# Patient Record
Sex: Female | Born: 1965 | Race: White | Hispanic: No | Marital: Married | Smoking: Never smoker
Health system: Southern US, Community
[De-identification: ages and names within clinical notes are randomized; demographics above are authoritative.]

## PROBLEM LIST (undated history)

## (undated) DIAGNOSIS — I5030 Unspecified diastolic (congestive) heart failure: Secondary | ICD-10-CM

## (undated) DIAGNOSIS — E119 Type 2 diabetes mellitus without complications: Secondary | ICD-10-CM

## (undated) DIAGNOSIS — F04 Amnestic disorder due to known physiological condition: Secondary | ICD-10-CM

## (undated) DIAGNOSIS — R4189 Other symptoms and signs involving cognitive functions and awareness: Secondary | ICD-10-CM

## (undated) DIAGNOSIS — N183 Chronic kidney disease, stage 3 unspecified: Secondary | ICD-10-CM

## (undated) DIAGNOSIS — N189 Chronic kidney disease, unspecified: Secondary | ICD-10-CM

## (undated) DIAGNOSIS — K219 Gastro-esophageal reflux disease without esophagitis: Secondary | ICD-10-CM

## (undated) DIAGNOSIS — K2950 Unspecified chronic gastritis without bleeding: Secondary | ICD-10-CM

## (undated) DIAGNOSIS — D631 Anemia in chronic kidney disease: Secondary | ICD-10-CM

## (undated) HISTORY — PX: CHOLECYSTECTOMY: SHX55

## (undated) HISTORY — PX: DILATION AND CURETTAGE OF UTERUS: SHX78

---

## 2005-05-21 ENCOUNTER — Ambulatory Visit: Payer: Self-pay | Admitting: Family Medicine

## 2006-02-14 ENCOUNTER — Other Ambulatory Visit: Payer: Self-pay

## 2006-02-14 ENCOUNTER — Ambulatory Visit: Payer: Self-pay | Admitting: General Surgery

## 2006-06-06 ENCOUNTER — Emergency Department: Payer: Self-pay | Admitting: Emergency Medicine

## 2006-07-08 ENCOUNTER — Ambulatory Visit: Payer: Self-pay | Admitting: Family Medicine

## 2006-08-06 ENCOUNTER — Ambulatory Visit: Payer: Self-pay | Admitting: Family Medicine

## 2006-09-06 ENCOUNTER — Ambulatory Visit: Payer: Self-pay | Admitting: Family Medicine

## 2006-09-26 ENCOUNTER — Ambulatory Visit: Payer: Self-pay | Admitting: Unknown Physician Specialty

## 2006-09-26 ENCOUNTER — Other Ambulatory Visit: Payer: Self-pay

## 2006-10-13 ENCOUNTER — Ambulatory Visit: Payer: Self-pay | Admitting: Cardiovascular Disease

## 2006-11-01 ENCOUNTER — Ambulatory Visit: Payer: Self-pay | Admitting: Unknown Physician Specialty

## 2006-11-21 ENCOUNTER — Emergency Department: Payer: Self-pay | Admitting: Emergency Medicine

## 2006-11-21 ENCOUNTER — Other Ambulatory Visit: Payer: Self-pay

## 2006-11-25 ENCOUNTER — Ambulatory Visit: Payer: Self-pay | Admitting: Family Medicine

## 2006-12-20 ENCOUNTER — Ambulatory Visit: Payer: Self-pay | Admitting: Family Medicine

## 2007-06-20 ENCOUNTER — Emergency Department: Payer: Self-pay | Admitting: Emergency Medicine

## 2007-06-22 ENCOUNTER — Emergency Department: Payer: Self-pay | Admitting: Emergency Medicine

## 2008-05-03 ENCOUNTER — Emergency Department: Payer: Self-pay | Admitting: Emergency Medicine

## 2009-04-25 ENCOUNTER — Emergency Department: Payer: Self-pay | Admitting: Emergency Medicine

## 2009-10-02 ENCOUNTER — Emergency Department: Payer: Self-pay | Admitting: Emergency Medicine

## 2010-04-14 ENCOUNTER — Emergency Department: Payer: Self-pay | Admitting: Emergency Medicine

## 2013-06-07 DIAGNOSIS — B9681 Helicobacter pylori [H. pylori] as the cause of diseases classified elsewhere: Secondary | ICD-10-CM

## 2013-06-07 HISTORY — DX: Helicobacter pylori (H. pylori) as the cause of diseases classified elsewhere: B96.81

## 2013-07-18 ENCOUNTER — Ambulatory Visit: Payer: Self-pay | Admitting: Family Medicine

## 2013-07-25 ENCOUNTER — Ambulatory Visit: Payer: Self-pay | Admitting: Family Medicine

## 2013-09-20 ENCOUNTER — Ambulatory Visit: Payer: Self-pay | Admitting: Gastroenterology

## 2013-09-24 ENCOUNTER — Ambulatory Visit: Payer: Self-pay | Admitting: Gastroenterology

## 2013-09-28 LAB — PATHOLOGY REPORT

## 2013-11-16 ENCOUNTER — Ambulatory Visit: Payer: Self-pay | Admitting: Gastroenterology

## 2013-11-19 ENCOUNTER — Ambulatory Visit: Payer: Self-pay | Admitting: Gastroenterology

## 2013-11-20 LAB — PATHOLOGY REPORT

## 2014-05-01 ENCOUNTER — Ambulatory Visit: Payer: Self-pay | Admitting: Family Medicine

## 2014-09-20 ENCOUNTER — Other Ambulatory Visit: Payer: Self-pay | Admitting: Family Medicine

## 2014-09-20 DIAGNOSIS — N6489 Other specified disorders of breast: Secondary | ICD-10-CM

## 2014-10-25 ENCOUNTER — Ambulatory Visit: Payer: Self-pay

## 2014-10-25 ENCOUNTER — Ambulatory Visit
Admission: RE | Admit: 2014-10-25 | Discharge: 2014-10-25 | Disposition: A | Discharge status: Home / Self Care | Payer: No Typology Code available for payment source | Source: Ambulatory Visit | Attending: Family Medicine | Admitting: Family Medicine

## 2014-10-25 DIAGNOSIS — R928 Other abnormal and inconclusive findings on diagnostic imaging of breast: Secondary | ICD-10-CM | POA: Diagnosis present

## 2014-10-25 DIAGNOSIS — N6489 Other specified disorders of breast: Secondary | ICD-10-CM

## 2015-04-29 ENCOUNTER — Ambulatory Visit
Admission: RE | Admit: 2015-04-29 | Discharge: 2015-04-29 | Disposition: A | Discharge status: Home / Self Care | Payer: No Typology Code available for payment source | Source: Ambulatory Visit | Attending: Family Medicine | Admitting: Family Medicine

## 2015-04-29 ENCOUNTER — Other Ambulatory Visit: Payer: Self-pay | Admitting: Family Medicine

## 2015-04-29 DIAGNOSIS — J069 Acute upper respiratory infection, unspecified: Secondary | ICD-10-CM

## 2015-04-29 DIAGNOSIS — R509 Fever, unspecified: Secondary | ICD-10-CM | POA: Diagnosis present

## 2015-04-29 DIAGNOSIS — R05 Cough: Secondary | ICD-10-CM | POA: Insufficient documentation

## 2015-09-17 ENCOUNTER — Other Ambulatory Visit: Payer: Self-pay | Admitting: Family Medicine

## 2015-09-17 DIAGNOSIS — Z1231 Encounter for screening mammogram for malignant neoplasm of breast: Secondary | ICD-10-CM

## 2015-10-27 ENCOUNTER — Ambulatory Visit: Payer: No Typology Code available for payment source

## 2015-10-29 ENCOUNTER — Other Ambulatory Visit: Payer: Self-pay | Admitting: Family Medicine

## 2015-10-29 ENCOUNTER — Ambulatory Visit
Admission: RE | Admit: 2015-10-29 | Discharge: 2015-10-29 | Disposition: A | Discharge status: Home / Self Care | Payer: BLUE CROSS/BLUE SHIELD | Source: Ambulatory Visit | Attending: Family Medicine | Admitting: Family Medicine

## 2015-10-29 DIAGNOSIS — Z1231 Encounter for screening mammogram for malignant neoplasm of breast: Secondary | ICD-10-CM

## 2019-03-16 ENCOUNTER — Emergency Department
Admission: EM | Admit: 2019-03-16 | Discharge: 2019-03-16 | Disposition: A | Discharge status: Home / Self Care | Payer: No Typology Code available for payment source | Attending: Emergency Medicine | Admitting: Emergency Medicine

## 2019-03-16 ENCOUNTER — Encounter: Payer: Self-pay | Admitting: Emergency Medicine

## 2019-03-16 ENCOUNTER — Emergency Department: Payer: No Typology Code available for payment source

## 2019-03-16 ENCOUNTER — Other Ambulatory Visit: Payer: Self-pay

## 2019-03-16 DIAGNOSIS — Y93I9 Activity, other involving external motion: Secondary | ICD-10-CM | POA: Diagnosis not present

## 2019-03-16 DIAGNOSIS — M1611 Unilateral primary osteoarthritis, right hip: Secondary | ICD-10-CM

## 2019-03-16 DIAGNOSIS — M5416 Radiculopathy, lumbar region: Secondary | ICD-10-CM | POA: Diagnosis not present

## 2019-03-16 DIAGNOSIS — M545 Low back pain: Secondary | ICD-10-CM | POA: Diagnosis present

## 2019-03-16 DIAGNOSIS — Y92414 Local residential or business street as the place of occurrence of the external cause: Secondary | ICD-10-CM | POA: Insufficient documentation

## 2019-03-16 DIAGNOSIS — Y999 Unspecified external cause status: Secondary | ICD-10-CM | POA: Diagnosis not present

## 2019-03-16 DIAGNOSIS — M25559 Pain in unspecified hip: Secondary | ICD-10-CM

## 2019-03-16 MED ORDER — TRAMADOL HCL 50 MG PO TABS: 50.0000 mg | ORAL_TABLET | Freq: Four times a day (QID) | ORAL | 0 refills | Status: DC | PRN

## 2019-03-16 MED ORDER — TRAMADOL HCL 50 MG PO TABS
50.0000 mg | ORAL_TABLET | Freq: Once | ORAL | Status: AC
  Administered 2019-03-16: 50 mg via ORAL
  Filled 2019-03-16: qty 1
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
## 2019-03-16 NOTE — Discharge Instructions (Addendum)
Please take tramadol as needed for severe pain.  You may use Tylenol for mild to moderate pain.  Apply heating pad to the lower back and hip.  If no improvement in 2 weeks follow-up with orthopedics.

## 2019-03-16 NOTE — ED Notes (Signed)
Patient back from x-ray 

## 2019-07-17 ENCOUNTER — Encounter: Payer: Self-pay | Admitting: Emergency Medicine

## 2019-07-17 ENCOUNTER — Inpatient Hospital Stay
Admission: EM | Admit: 2019-07-17 | Discharge: 2019-07-30 | DRG: 388 | Disposition: A | Discharge status: Home Health | Payer: Self-pay | Attending: Hospitalist | Admitting: Hospitalist

## 2019-07-17 ENCOUNTER — Emergency Department: Payer: Self-pay

## 2019-07-17 ENCOUNTER — Other Ambulatory Visit: Payer: Self-pay

## 2019-07-17 DIAGNOSIS — E872 Acidosis, unspecified: Secondary | ICD-10-CM

## 2019-07-17 DIAGNOSIS — Z880 Allergy status to penicillin: Secondary | ICD-10-CM

## 2019-07-17 DIAGNOSIS — E876 Hypokalemia: Secondary | ICD-10-CM | POA: Diagnosis present

## 2019-07-17 DIAGNOSIS — H02402 Unspecified ptosis of left eyelid: Secondary | ICD-10-CM | POA: Diagnosis present

## 2019-07-17 DIAGNOSIS — N170 Acute kidney failure with tubular necrosis: Secondary | ICD-10-CM | POA: Diagnosis present

## 2019-07-17 DIAGNOSIS — R638 Other symptoms and signs concerning food and fluid intake: Secondary | ICD-10-CM

## 2019-07-17 DIAGNOSIS — E860 Dehydration: Secondary | ICD-10-CM | POA: Diagnosis present

## 2019-07-17 DIAGNOSIS — Z79891 Long term (current) use of opiate analgesic: Secondary | ICD-10-CM

## 2019-07-17 DIAGNOSIS — Z20822 Contact with and (suspected) exposure to covid-19: Secondary | ICD-10-CM | POA: Diagnosis present

## 2019-07-17 DIAGNOSIS — K449 Diaphragmatic hernia without obstruction or gangrene: Secondary | ICD-10-CM | POA: Diagnosis present

## 2019-07-17 DIAGNOSIS — K2900 Acute gastritis without bleeding: Secondary | ICD-10-CM | POA: Diagnosis present

## 2019-07-17 DIAGNOSIS — R112 Nausea with vomiting, unspecified: Secondary | ICD-10-CM

## 2019-07-17 DIAGNOSIS — Z8719 Personal history of other diseases of the digestive system: Secondary | ICD-10-CM

## 2019-07-17 DIAGNOSIS — E785 Hyperlipidemia, unspecified: Secondary | ICD-10-CM | POA: Diagnosis present

## 2019-07-17 DIAGNOSIS — Z8619 Personal history of other infectious and parasitic diseases: Secondary | ICD-10-CM

## 2019-07-17 DIAGNOSIS — K8500 Idiopathic acute pancreatitis without necrosis or infection: Secondary | ICD-10-CM

## 2019-07-17 DIAGNOSIS — E119 Type 2 diabetes mellitus without complications: Secondary | ICD-10-CM

## 2019-07-17 DIAGNOSIS — K2950 Unspecified chronic gastritis without bleeding: Secondary | ICD-10-CM | POA: Diagnosis present

## 2019-07-17 DIAGNOSIS — Z7982 Long term (current) use of aspirin: Secondary | ICD-10-CM

## 2019-07-17 DIAGNOSIS — E1169 Type 2 diabetes mellitus with other specified complication: Secondary | ICD-10-CM | POA: Diagnosis present

## 2019-07-17 DIAGNOSIS — K859 Acute pancreatitis without necrosis or infection, unspecified: Secondary | ICD-10-CM | POA: Diagnosis present

## 2019-07-17 DIAGNOSIS — Z6823 Body mass index (BMI) 23.0-23.9, adult: Secondary | ICD-10-CM

## 2019-07-17 DIAGNOSIS — I959 Hypotension, unspecified: Secondary | ICD-10-CM | POA: Diagnosis present

## 2019-07-17 DIAGNOSIS — Z9049 Acquired absence of other specified parts of digestive tract: Secondary | ICD-10-CM

## 2019-07-17 DIAGNOSIS — G510 Bell's palsy: Secondary | ICD-10-CM | POA: Diagnosis present

## 2019-07-17 DIAGNOSIS — R531 Weakness: Secondary | ICD-10-CM

## 2019-07-17 DIAGNOSIS — Z79899 Other long term (current) drug therapy: Secondary | ICD-10-CM

## 2019-07-17 DIAGNOSIS — E46 Unspecified protein-calorie malnutrition: Secondary | ICD-10-CM

## 2019-07-17 DIAGNOSIS — E43 Unspecified severe protein-calorie malnutrition: Secondary | ICD-10-CM | POA: Diagnosis present

## 2019-07-17 DIAGNOSIS — K3184 Gastroparesis: Secondary | ICD-10-CM | POA: Diagnosis present

## 2019-07-17 DIAGNOSIS — R1110 Vomiting, unspecified: Secondary | ICD-10-CM | POA: Diagnosis present

## 2019-07-17 DIAGNOSIS — R627 Adult failure to thrive: Secondary | ICD-10-CM | POA: Diagnosis present

## 2019-07-17 DIAGNOSIS — K5641 Fecal impaction: Principal | ICD-10-CM | POA: Diagnosis present

## 2019-07-17 DIAGNOSIS — Z597 Insufficient social insurance and welfare support: Secondary | ICD-10-CM

## 2019-07-17 DIAGNOSIS — F329 Major depressive disorder, single episode, unspecified: Secondary | ICD-10-CM | POA: Diagnosis present

## 2019-07-17 DIAGNOSIS — E1143 Type 2 diabetes mellitus with diabetic autonomic (poly)neuropathy: Secondary | ICD-10-CM | POA: Diagnosis present

## 2019-07-17 DIAGNOSIS — E11649 Type 2 diabetes mellitus with hypoglycemia without coma: Secondary | ICD-10-CM | POA: Diagnosis present

## 2019-07-17 DIAGNOSIS — R110 Nausea: Secondary | ICD-10-CM

## 2019-07-17 DIAGNOSIS — R634 Abnormal weight loss: Secondary | ICD-10-CM

## 2019-07-17 DIAGNOSIS — K5900 Constipation, unspecified: Secondary | ICD-10-CM

## 2019-07-17 DIAGNOSIS — F32A Depression, unspecified: Secondary | ICD-10-CM

## 2019-07-17 DIAGNOSIS — F05 Delirium due to known physiological condition: Secondary | ICD-10-CM | POA: Diagnosis not present

## 2019-07-17 DIAGNOSIS — N179 Acute kidney failure, unspecified: Secondary | ICD-10-CM

## 2019-07-17 DIAGNOSIS — Z803 Family history of malignant neoplasm of breast: Secondary | ICD-10-CM

## 2019-07-17 HISTORY — DX: Type 2 diabetes mellitus without complications: E11.9

## 2019-07-17 LAB — LIPASE, BLOOD: Lipase: 83 U/L — ABNORMAL HIGH (ref 11–51)

## 2019-07-17 LAB — URINALYSIS, COMPLETE (UACMP) WITH MICROSCOPIC
Bacteria, UA: NONE SEEN
Bilirubin Urine: NEGATIVE
Glucose, UA: NEGATIVE mg/dL
Hgb urine dipstick: NEGATIVE
Ketones, ur: 20 mg/dL — AB
Nitrite: NEGATIVE
Protein, ur: NEGATIVE mg/dL
Specific Gravity, Urine: 1.03 (ref 1.005–1.030)
pH: 6 (ref 5.0–8.0)

## 2019-07-17 LAB — COMPREHENSIVE METABOLIC PANEL
ALT: 22 U/L (ref 0–44)
AST: 25 U/L (ref 15–41)
Albumin: 4.2 g/dL (ref 3.5–5.0)
Alkaline Phosphatase: 109 U/L (ref 38–126)
Anion gap: 23 — ABNORMAL HIGH (ref 5–15)
BUN: 32 mg/dL — ABNORMAL HIGH (ref 6–20)
CO2: 19 mmol/L — ABNORMAL LOW (ref 22–32)
Calcium: 9.7 mg/dL (ref 8.9–10.3)
Chloride: 94 mmol/L — ABNORMAL LOW (ref 98–111)
Creatinine, Ser: 1.18 mg/dL — ABNORMAL HIGH (ref 0.44–1.00)
GFR calc Af Amer: >60 mL/min (ref >60)
GFR calc non Af Amer: 53 mL/min — ABNORMAL LOW (ref >60)
Glucose, Bld: 176 mg/dL — ABNORMAL HIGH (ref 70–99)
Potassium: 3.2 mmol/L — ABNORMAL LOW (ref 3.5–5.1)
Sodium: 136 mmol/L (ref 135–145)
Total Bilirubin: 1.9 mg/dL — ABNORMAL HIGH (ref 0.3–1.2)
Total Protein: 8.2 g/dL — ABNORMAL HIGH (ref 6.5–8.1)

## 2019-07-17 LAB — CBC
HCT: 44.5 % (ref 36.0–46.0)
HCT: 39.5 % (ref 36.0–46.0)
Hemoglobin: 15 g/dL (ref 12.0–15.0)
Hemoglobin: 13.3 g/dL (ref 12.0–15.0)
MCH: 27 pg (ref 26.0–34.0)
MCH: 27.5 pg (ref 26.0–34.0)
MCHC: 33.7 g/dL (ref 30.0–36.0)
MCHC: 33.7 g/dL (ref 30.0–36.0)
MCV: 80.2 fL (ref 80.0–100.0)
MCV: 81.6 fL (ref 80.0–100.0)
Platelets: 270 10*3/uL (ref 150–400)
Platelets: 210 10*3/uL (ref 150–400)
RBC: 5.55 MIL/uL — ABNORMAL HIGH (ref 3.87–5.11)
RBC: 4.84 MIL/uL (ref 3.87–5.11)
RDW: 18.5 % — ABNORMAL HIGH (ref 11.5–15.5)
RDW: 18.3 % — ABNORMAL HIGH (ref 11.5–15.5)
WBC: 8.1 10*3/uL (ref 4.0–10.5)
WBC: 8.6 10*3/uL (ref 4.0–10.5)
nRBC: 0.2 % (ref 0.0–0.2)
nRBC: 0 % (ref 0.0–0.2)

## 2019-07-17 LAB — RESPIRATORY PANEL BY RT PCR (FLU A&B, COVID)
Influenza A by PCR: NEGATIVE
Influenza B by PCR: NEGATIVE
SARS Coronavirus 2 by RT PCR: NEGATIVE

## 2019-07-17 LAB — GLUCOSE, CAPILLARY: Glucose-Capillary: 104 mg/dL — ABNORMAL HIGH (ref 70–99)

## 2019-07-17 LAB — POC SARS CORONAVIRUS 2 AG: SARS Coronavirus 2 Ag: NEGATIVE

## 2019-07-17 MED ORDER — ONDANSETRON HCL 4 MG/2ML IJ SOLN
4.0000 mg | Freq: Once | INTRAMUSCULAR | Status: AC | PRN
  Administered 2019-07-17: 4 mg via INTRAVENOUS
  Filled 2019-07-17: qty 2

## 2019-07-17 MED ORDER — PANTOPRAZOLE SODIUM 40 MG IV SOLR
40.0000 mg | INTRAVENOUS | Status: DC
  Administered 2019-07-17 – 2019-07-18 (×2): 40 mg via INTRAVENOUS
  Filled 2019-07-17 (×2): qty 40

## 2019-07-17 MED ORDER — SODIUM CHLORIDE 0.9 % IV BOLUS
1000.0000 mL | Freq: Once | INTRAVENOUS | Status: AC
  Administered 2019-07-17: 1000 mL via INTRAVENOUS

## 2019-07-17 MED ORDER — ONDANSETRON HCL 4 MG PO TABS: 4.0000 mg | ORAL_TABLET | Freq: Four times a day (QID) | ORAL | Status: DC | PRN

## 2019-07-17 MED ORDER — SODIUM CHLORIDE 0.9 % IV SOLN: Freq: Once | INTRAVENOUS | Status: AC

## 2019-07-17 MED ORDER — INSULIN ASPART 100 UNIT/ML ~~LOC~~ SOLN: 0.0000 [IU] | Freq: Three times a day (TID) | SUBCUTANEOUS | Status: DC

## 2019-07-17 MED ORDER — ACETAMINOPHEN 650 MG RE SUPP: 650.0000 mg | Freq: Four times a day (QID) | RECTAL | Status: DC | PRN

## 2019-07-17 MED ORDER — ACETAMINOPHEN 325 MG PO TABS
650.0000 mg | ORAL_TABLET | Freq: Four times a day (QID) | ORAL | Status: DC | PRN
  Administered 2019-07-18 – 2019-07-21 (×3): 650 mg via ORAL
  Filled 2019-07-17 (×4): qty 2

## 2019-07-17 MED ORDER — IOHEXOL 300 MG/ML  SOLN
75.0000 mL | Freq: Once | INTRAMUSCULAR | Status: AC | PRN
  Administered 2019-07-17: 75 mL via INTRAVENOUS

## 2019-07-17 MED ORDER — PROMETHAZINE HCL 25 MG/ML IJ SOLN
12.5000 mg | Freq: Four times a day (QID) | INTRAMUSCULAR | Status: DC | PRN
  Administered 2019-07-17: 12.5 mg via INTRAVENOUS
  Filled 2019-07-17: qty 1

## 2019-07-17 MED ORDER — ONDANSETRON HCL 4 MG/2ML IJ SOLN
4.0000 mg | Freq: Four times a day (QID) | INTRAMUSCULAR | Status: DC | PRN
  Administered 2019-07-18 – 2019-07-26 (×5): 4 mg via INTRAVENOUS
  Filled 2019-07-17 (×5): qty 2

## 2019-07-17 MED ORDER — FAMOTIDINE IN NACL 20-0.9 MG/50ML-% IV SOLN
20.0000 mg | Freq: Once | INTRAVENOUS | Status: AC
  Administered 2019-07-17: 20 mg via INTRAVENOUS
  Filled 2019-07-17: qty 50

## 2019-07-17 MED ORDER — PROMETHAZINE HCL 25 MG RE SUPP: 25.0000 mg | Freq: Four times a day (QID) | RECTAL | Status: DC | PRN

## 2019-07-17 MED ORDER — ENOXAPARIN SODIUM 40 MG/0.4ML ~~LOC~~ SOLN
40.0000 mg | SUBCUTANEOUS | Status: DC
  Administered 2019-07-17 – 2019-07-23 (×7): 40 mg via SUBCUTANEOUS
  Filled 2019-07-17 (×7): qty 0.4

## 2019-07-17 MED ORDER — ONDANSETRON HCL 4 MG/2ML IJ SOLN
4.0000 mg | Freq: Once | INTRAMUSCULAR | Status: AC
  Administered 2019-07-17: 4 mg via INTRAVENOUS
  Filled 2019-07-17: qty 2

## 2019-07-17 MED ORDER — POTASSIUM CHLORIDE IN NACL 40-0.9 MEQ/L-% IV SOLN
INTRAVENOUS | Status: DC
  Administered 2019-07-17 – 2019-07-18 (×2): 125 mL/h via INTRAVENOUS
  Filled 2019-07-17 (×6): qty 1000

## 2019-07-17 NOTE — H&P (Signed)
History and Physical    Deanna Joseph ZOX:[id number] DOB: [birth year] DOA: 07/17/2019  PCP: Hillery Aldo, MD   Patient coming from: home  I have personally briefly reviewed patient's old medical records in [facility]  Chief Complaint: intractable vomiting,weight loss  HPI: Deanna Joseph is a 54 y.o. female with medical history significant for diabetes and hypertension, no longer on medication due to blood sugar from recent unintentional weight loss, as well as history of chronic active gastritis and H. pylori on EGD in 2015, who presents to the emergency room with intractable vomiting, unable to hold anything down and a 30 pound weight loss in the past 3 months.  Patient had a similar presentation back in 2015 when she had her endoscopy.  She denies fever, chills, cough, has minimal epigastric pain.  Vomiting occurs after ingestion of any food stuff and consists of ingested food products, no blood no bile and no coffee grounds.  She denies change in bowel habit.  Denies chest pains or shortness of breath  ED Course: On arrival in the emergency room she was afebrile, tachycardic at 127 and hypotensive at 93/49 improving to 106/77 with fluid bolus.  RR was 16 with O2 sat 93% on room air.  On her blood work she had a creatinine of 1.18 to baseline unknown and electrolyte disturbance to include hypokalemia of 3.2 and low serum bicarb of 19.  Lipase was slightly elevated at 83.  White cell count was normal at 8100, hemoglobin 15.  The abdomen and pelvis showed no acute findings.  Lung bases were clear.  Was given an IV fluid bolus along with IV famotidine and hospitalist consulted for admission.  Review of Systems: As per HPI otherwise 10 point review of systems negative.    Past Medical History:  Diagnosis Date  . Diabetes mellitus without complication [facility])     Past Surgical History:  Procedure Laterality Date  . CESAREAN SECTION    . CHOLECYSTECTOMY    . DILATION AND CURETTAGE OF  UTERUS       has no history on file for tobacco, alcohol, and drug.  Allergies  Allergen Reactions  . Penicillins     Rash     Family History  Problem Relation Age of Onset  . Breast cancer Maternal Aunt 55     Prior to Admission medications   Medication Sig Start Date End Date Taking? Authorizing Provider  aspirin EC 81 MG tablet Take 81 mg by mouth daily.   Yes [provider]  atorvastatin (LIPITOR) 20 MG tablet Take 20 mg by mouth daily.   Yes [provider]  cetirizine (ZYRTEC) 10 MG tablet Take 10 mg by mouth daily.   Yes [provider]  gabapentin (NEURONTIN) 300 MG capsule Take 300 mg by mouth at bedtime.   Yes [provider]  omeprazole (PRILOSEC) 20 MG capsule Take 20 mg by mouth daily.   Yes [provider]  PARoxetine (PAXIL) 40 MG tablet Take 40 mg by mouth daily.   Yes [provider]  traMADol (ULTRAM) 50 MG tablet Take 1 tablet (50 mg total) by mouth every 6 (six) hours as needed. 03/16/19 03/15/20 Yes Evon Slack, PA-C    Physical Exam: Vitals:   07/17/19 1755 07/17/19 1756 07/17/19 2000 07/17/19 2030  BP:  (!) 93/49 106/77 111/64  Pulse:  (!) 127    Resp:  16    SpO2:  93%    Weight: 54.8 kg  Height: 5\' 1"  (1.549 m)        Vitals:   07/17/19 1755 07/17/19 1756 07/17/19 2000 07/17/19 2030  BP:  (!) 93/49 106/77 111/64  Pulse:  (!) 127    Resp:  16    SpO2:  93%    Weight: 54.8 kg     Height: 5\' 1"  (1.549 m)       Constitutional: NAD, alert and oriented x 3 Eyes: PERRL, lids and conjunctivae normal ENMT: Mucous membranes are moist.  Neck: normal, supple, no masses, no thyromegaly Respiratory: clear to auscultation bilaterally, no wheezing, no crackles. Normal respiratory effort. No accessory muscle use.  Cardiovascular: Regular rate and rhythm, no murmurs / rubs / gallops. No extremity edema. 2+ pedal pulses. No carotid bruits.  Abdomen: non tender no masses palpated. No  hepatosplenomegaly. Bowel sounds positive.  Musculoskeletal: no clubbing / cyanosis. No joint deformity upper and lower extremities.  Skin: no rashes, lesions, ulcers.  Neurologic: No gross focal neurologic deficit. Psychiatric: Normal mood and affect.   Labs on Admission: I have personally reviewed following labs and imaging studies  CBC: Recent Labs  Lab 07/17/19 1809  WBC 8.1  HGB 15.0  HCT 44.5  MCV 80.2  PLT 270   Basic Metabolic Panel: Recent Labs  Lab 07/17/19 1809  NA 136  K 3.2*  CL 94*  CO2 19*  GLUCOSE 176*  BUN 32*  CREATININE 1.18*  CALCIUM 9.7   GFR: Estimated Creatinine Clearance: 41.6 mL/min (A) (by C-G formula based on SCr of 1.18 mg/dL (H)). Liver Function Tests: Recent Labs  Lab 07/17/19 1809  AST 25  ALT 22  ALKPHOS 109  BILITOT 1.9*  PROT 8.2*  ALBUMIN 4.2   Recent Labs  Lab 07/17/19 1809  LIPASE 83*   No results for input(s): AMMONIA in the last 168 hours. Coagulation Profile: No results for input(s): INR, PROTIME in the last 168 hours. Cardiac Enzymes: No results for input(s): CKTOTAL, CKMB, CKMBINDEX, TROPONINI in the last 168 hours. BNP (last 3 results) No results for input(s): PROBNP in the last 8760 hours. HbA1C: No results for input(s): HGBA1C in the last 72 hours. CBG: No results for input(s): GLUCAP in the last 168 hours. Lipid Profile: No results for input(s): CHOL, HDL, LDLCALC, TRIG, CHOLHDL, LDLDIRECT in the last 72 hours. Thyroid Function Tests: No results for input(s): TSH, T4TOTAL, FREET4, T3FREE, THYROIDAB in the last 72 hours. Anemia Panel: No results for input(s): VITAMINB12, FOLATE, FERRITIN, TIBC, IRON, RETICCTPCT in the last 72 hours. Urine analysis: No results found for: COLORURINE, APPEARANCEUR, LABSPEC, PHURINE, GLUCOSEU, HGBUR, BILIRUBINUR, KETONESUR, PROTEINUR, UROBILINOGEN, NITRITE, LEUKOCYTESUR  Radiological Exams on Admission: CT ABDOMEN PELVIS W CONTRAST  Result Date: 07/17/2019 CLINICAL DATA:   54 year old female with nausea vomiting. EXAM: CT ABDOMEN AND PELVIS WITH CONTRAST TECHNIQUE: Multidetector CT imaging of the abdomen and pelvis was performed using the standard protocol following bolus administration of intravenous contrast. CONTRAST:  7mL OMNIPAQUE IOHEXOL 300 MG/ML  SOLN COMPARISON:  None. FINDINGS: Lower chest: There is a 4 mm right lower lobe nodule (series 4, image 3), possibly a partially calcified granuloma. The visualized lung bases are otherwise clear. No intra-abdominal free air or free fluid. Hepatobiliary: The liver is unremarkable. No intrahepatic biliary duct dilatation. Cholecystectomy. Pancreas: Unremarkable. No pancreatic ductal dilatation or surrounding inflammatory changes. Spleen: Normal in size without focal abnormality. Adrenals/Urinary Tract: The adrenal glands are unremarkable. The kidneys, visualized ureters, and urinary bladder appear unremarkable. Stomach/Bowel: There is moderate stool throughout the colon. There  is no bowel obstruction or active inflammation. Normal appendix. Vascular/Lymphatic: Minimal aortoiliac atherosclerotic disease. The IVC is unremarkable. No portal venous gas. There is no adenopathy. Reproductive: Hysterectomy.  No adnexal masses. Other: None Musculoskeletal: No acute or significant osseous findings. IMPRESSION: 1. No acute intra-abdominopelvic pathology. 2. Mild Aortic Atherosclerosis (ICD10-I70.0). Electronically Signed   By: Anner Crete M.D.   On: 07/17/2019 19:54    EKG: Independently reviewed.   Assessment/Plan Principal Problem: Suspect acute gastritis possible PUD History of chronic gastritis History of H. pylori infection -Lipase elevated but possibly reactive -Patient presented with intractable vomiting with nausea and has history of positive H. pylori on EGD in 2015 -IV Protonix -Gastroenterology consult for consideration of EGD -Keep n.p.o.    AKI (acute kidney injury) ([place])   Metabolic acidosis    Hypokalemia -Prerenal ATN secondary to acute on chronic vomiting -IV hydration with potassium -Monitor renal function and avoid nephrotoxins    Elevated lipase -Likely reactive but continue to monitor and keep n.p.o. -CT abdomen and pelvis showed no acute findings    Unintentional weight loss of more than 10 pounds in 90 days  -Nutritionist consult    Diet-controlled diabetes mellitus ([place]) -Patient no longer on medication, related to ongoing weight loss    DVT prophylaxis: lovenox  Code Status: full code  Family Communication: none  Disposition Plan: Back to previous home environment Consults called: GI, Dr. Christella Hartigan MD [facility]     07/17/2019, 9:00 PM

## 2019-07-17 NOTE — ED Notes (Signed)
poct covid Negative. 

## 2019-07-17 NOTE — ED Triage Notes (Signed)
Patient reports intermittent vomiting since January. States she was taken off of all diabetic meds then, however her glucose has remained stable. Patient states her PCP was supposed to schedule CT scan, however she has not had it yet.

## 2019-07-17 NOTE — ED Notes (Signed)
Pt ambulated to bathroom with one assist. Pt started vomiting after getting back to bed. MD notified.

## 2019-07-17 NOTE — ED Notes (Addendum)
Pt reports nausea for several weeks.  States it is worse for last 3 days.  Hx diabetes, no meds now for diabetes..  Pt saw her doctor who wanted her to have a ct scan.  Pt alert  Iv in place.

## 2019-07-17 NOTE — ED Provider Notes (Signed)
[facility] Emergency Department Provider Note       Time seen: ----------------------------------------- 7:20 PM on 07/17/2019 -----------------------------------------   I have reviewed the triage vital signs and the nursing notes.  HISTORY   Chief Complaint Emesis    HPI Deanna Joseph is a 54 y.o. female with no significant past medical history who presents to the ED for intermittent vomiting since January.  Patient states vomiting is worse if she tries to eat or if she moves around.  She states she is losing about a pound a day.  Patient states she was taken off all of her diabetic medications then, however her glucose remained stable.  Primary care doctor was supposed to schedule CT scan, however she has not had that yet.  She denies fevers, chills or other complaints.  History reviewed. No pertinent past medical history.  There are no problems to display for this patient.   Past Surgical History:  Procedure Laterality Date  . CESAREAN SECTION    . CHOLECYSTECTOMY    . DILATION AND CURETTAGE OF UTERUS      Allergies Penicillins  Social History Social History   Tobacco Use  . Smoking status: Not on file  Substance Use Topics  . Alcohol use: Not on file  . Drug use: Not on file    Review of Systems Constitutional: Negative for fever. Cardiovascular: Negative for chest pain. Respiratory: Negative for shortness of breath. Gastrointestinal: Negative for abdominal pain, positive for vomiting Musculoskeletal: Negative for back pain. Skin: Negative for rash. Neurological: Negative for headaches, focal weakness or numbness.  All systems negative/normal/unremarkable except as stated in the HPI  ____________________________________________   PHYSICAL EXAM:  VITAL SIGNS: ED Triage Vitals  Enc Vitals Group     BP 07/17/19 1756 (!) 93/49     Pulse Rate 07/17/19 1756 (!) 127     Resp 07/17/19 1756 16     Temp --      Temp src --      SpO2 07/17/19 1756 93 %     Weight 07/17/19 1755 120 lb 12.8 oz (54.8 kg)     Height 07/17/19 1755 5\' 1"  (1.549 m)     Head Circumference --      Peak Flow --      Pain Score 07/17/19 1755 5     Pain Loc --      Pain Edu? --      Excl. in [place]? --     Constitutional: Alert and oriented.  Mild to moderate distress Eyes: Conjunctivae are normal. Normal extraocular movements. ENT      Head: Normocephalic and atraumatic.      Nose: No congestion/rhinnorhea.      Mouth/Throat: Mucous membranes are moist.      Neck: No stridor. Cardiovascular: Normal rate, regular rhythm. No murmurs, rubs, or gallops. Respiratory: Normal respiratory effort without tachypnea nor retractions. Breath sounds are clear and equal bilaterally. No wheezes/rales/rhonchi. Gastrointestinal: Nonfocal tenderness, normal bowel sounds Musculoskeletal: Nontender with normal range of motion in extremities. No lower extremity tenderness nor edema. Neurologic:  Normal speech and language. No gross focal neurologic deficits are appreciated.  Skin:  Skin is warm, dry and intact. No rash noted. Psychiatric: Mood and affect are normal. Speech and behavior are normal.  ____________________________________________  EKG: Interpreted by me.  Sinus tachycardia with a rate of 128 bpm, nonspecific ST and T wave abnormality, normal axis, normal QT  ____________________________________________  ED COURSE:  As part of my medical decision  making, I reviewed the following data within the electronic MEDICAL RECORD NUMBER History obtained from family if available, nursing notes, old chart and ekg, as well as notes from prior ED visits. Patient presented for intermittent vomiting, we will assess with labs and imaging as indicated at this time.   Procedures  Deanna Joseph was evaluated in Emergency Department on 07/17/2019 for the symptoms described in the history of present illness. She was evaluated in the context of the global COVID-19  pandemic, which necessitated consideration that the patient might be at risk for infection with the SARS-CoV-2 virus that causes COVID-19. Institutional protocols and algorithms that pertain to the evaluation of patients at risk for COVID-19 are in a state of rapid change based on information released by regulatory bodies including the CDC and federal and state organizations. These policies and algorithms were followed during the patient's care in the ED.  ____________________________________________   LABS (pertinent positives/negatives)  Labs Reviewed  LIPASE, BLOOD - Abnormal; Notable for the following components:      Result Value   Lipase 83 (*)    All other components within normal limits  COMPREHENSIVE METABOLIC PANEL - Abnormal; Notable for the following components:   Potassium 3.2 (*)    Chloride 94 (*)    CO2 19 (*)    Glucose, Bld 176 (*)    BUN 32 (*)    Creatinine, Ser 1.18 (*)    Total Protein 8.2 (*)    Total Bilirubin 1.9 (*)    GFR calc non Af Amer 53 (*)    Anion gap 23 (*)    All other components within normal limits  CBC - Abnormal; Notable for the following components:   RBC 5.55 (*)    RDW 18.5 (*)    All other components within normal limits  URINALYSIS, COMPLETE (UACMP) WITH MICROSCOPIC    RADIOLOGY Images were viewed by me  CT the abdomen pelvis with contrast IMPRESSION: 1. No acute intra-abdominopelvic pathology. 2. Mild Aortic Atherosclerosis (ICD10-I70.0). ____________________________________________   DIFFERENTIAL DIAGNOSIS   Gastroenteritis, dehydration, electrolyte abnormality, pancreatitis, choledocholithiasis, peptic ulcer disease  FINAL ASSESSMENT AND PLAN  Vomiting, mild pancreatitis, 30 pound weight loss   Plan: The patient had presented for persistent vomiting. Patient's labs reveal dehydration as well as mildly elevated lipase, mild hyperbilirubinemia.  CBC was unremarkable. Patient's imaging did not reveal any acute process.  She  has received fluids and Zofran as well as IV Pepcid.  She has essentially had intractable vomiting and is lost 30 pounds in the last month or so.  I think she would benefit from hospital admission, I will discuss with the hospitalist for admission with GI consultation.   Ulice Dash, MD    Note: This note was generated in part or whole with voice recognition software. Voice recognition is usually quite accurate but there are transcription errors that can and very often do occur. I apologize for any typographical errors that were not detected and corrected.     Emily Filbert, MD 07/17/19 2022

## 2019-07-18 ENCOUNTER — Encounter
Admission: EM | Disposition: A | Discharge status: Home Health | Payer: Self-pay | Source: Home / Self Care | Attending: Hospitalist

## 2019-07-18 ENCOUNTER — Inpatient Hospital Stay: Payer: Self-pay | Admitting: Anesthesiology

## 2019-07-18 ENCOUNTER — Encounter: Payer: Self-pay | Admitting: Internal Medicine

## 2019-07-18 DIAGNOSIS — K3184 Gastroparesis: Secondary | ICD-10-CM | POA: Diagnosis present

## 2019-07-18 HISTORY — PX: ESOPHAGOGASTRODUODENOSCOPY: SHX5428

## 2019-07-18 LAB — BASIC METABOLIC PANEL
Anion gap: 14 (ref 5–15)
BUN: 27 mg/dL — ABNORMAL HIGH (ref 6–20)
CO2: 20 mmol/L — ABNORMAL LOW (ref 22–32)
Calcium: 8.3 mg/dL — ABNORMAL LOW (ref 8.9–10.3)
Chloride: 108 mmol/L (ref 98–111)
Creatinine, Ser: 0.85 mg/dL (ref 0.44–1.00)
GFR calc Af Amer: >60 mL/min (ref >60)
GFR calc non Af Amer: >60 mL/min (ref >60)
Glucose, Bld: 86 mg/dL (ref 70–99)
Potassium: 3.9 mmol/L (ref 3.5–5.1)
Sodium: 142 mmol/L (ref 135–145)

## 2019-07-18 LAB — GLUCOSE, CAPILLARY
Glucose-Capillary: 66 mg/dL — ABNORMAL LOW (ref 70–99)
Glucose-Capillary: 133 mg/dL — ABNORMAL HIGH (ref 70–99)
Glucose-Capillary: 85 mg/dL (ref 70–99)
Glucose-Capillary: 81 mg/dL (ref 70–99)
Glucose-Capillary: 80 mg/dL (ref 70–99)

## 2019-07-18 LAB — CBC
HCT: 35.2 % — ABNORMAL LOW (ref 36.0–46.0)
Hemoglobin: 12 g/dL (ref 12.0–15.0)
MCH: 27.3 pg (ref 26.0–34.0)
MCHC: 34.1 g/dL (ref 30.0–36.0)
MCV: 80.2 fL (ref 80.0–100.0)
Platelets: 156 10*3/uL (ref 150–400)
RBC: 4.39 MIL/uL (ref 3.87–5.11)
RDW: 18 % — ABNORMAL HIGH (ref 11.5–15.5)
WBC: 6 10*3/uL (ref 4.0–10.5)
nRBC: 0 % (ref 0.0–0.2)

## 2019-07-18 LAB — HIV ANTIBODY (ROUTINE TESTING W REFLEX): HIV Screen 4th Generation wRfx: NONREACTIVE

## 2019-07-18 SURGERY — EGD (ESOPHAGOGASTRODUODENOSCOPY)
Anesthesia: General

## 2019-07-18 MED ORDER — ONDANSETRON HCL 4 MG/2ML IJ SOLN
INTRAMUSCULAR | Status: AC
  Filled 2019-07-18: qty 2

## 2019-07-18 MED ORDER — METOCLOPRAMIDE HCL 5 MG/ML IJ SOLN
5.0000 mg | Freq: Three times a day (TID) | INTRAMUSCULAR | Status: DC
  Administered 2019-07-18 – 2019-07-19 (×3): 5 mg via INTRAVENOUS
  Filled 2019-07-18 (×3): qty 2

## 2019-07-18 MED ORDER — PROPOFOL 500 MG/50ML IV EMUL
INTRAVENOUS | Status: AC
  Filled 2019-07-18: qty 50

## 2019-07-18 MED ORDER — LACTATED RINGERS IV SOLN: INTRAVENOUS | Status: DC

## 2019-07-18 MED ORDER — SUCCINYLCHOLINE CHLORIDE 20 MG/ML IJ SOLN
INTRAMUSCULAR | Status: AC
  Filled 2019-07-18: qty 1

## 2019-07-18 MED ORDER — LIDOCAINE HCL (PF) 2 % IJ SOLN
INTRAMUSCULAR | Status: AC
  Filled 2019-07-18: qty 10

## 2019-07-18 MED ORDER — DEXTROSE 5 % IV SOLN: INTRAVENOUS | Status: AC

## 2019-07-18 MED ORDER — EPHEDRINE SULFATE 50 MG/ML IJ SOLN
INTRAMUSCULAR | Status: AC
  Filled 2019-07-18: qty 1

## 2019-07-18 MED ORDER — ONDANSETRON HCL 4 MG/2ML IJ SOLN
INTRAMUSCULAR | Status: DC | PRN
  Administered 2019-07-18: 4 mg via INTRAVENOUS

## 2019-07-18 MED ORDER — DEXTROSE 50 % IV SOLN
12.5000 g | INTRAVENOUS | Status: AC
  Administered 2019-07-18: 12.5 g via INTRAVENOUS
  Filled 2019-07-18: qty 50

## 2019-07-18 MED ORDER — PROPOFOL 10 MG/ML IV BOLUS
INTRAVENOUS | Status: DC | PRN
  Administered 2019-07-18: 70 mg via INTRAVENOUS

## 2019-07-18 NOTE — Anesthesia Postprocedure Evaluation (Signed)
Anesthesia Post Note  [name]  Procedure(s) Performed: ESOPHAGOGASTRODUODENOSCOPY (EGD) (N/A )  Patient location during evaluation: Endoscopy Anesthesia Type: General Level of consciousness: awake and alert Pain management: pain level controlled Vital Signs Assessment: post-procedure vital signs reviewed and stable Respiratory status: spontaneous breathing, nonlabored ventilation, respiratory function stable and patient connected to nasal cannula oxygen Cardiovascular status: blood pressure returned to baseline and stable Postop Assessment: no apparent nausea or vomiting Anesthetic complications: no     Last Vitals:  Vitals:   07/18/19 1506 07/18/19 1516  BP: (!) 93/52 (!) 86/56  Pulse: 80 76  Resp: 17 14  Temp:    SpO2: 100% 100%    Last Pain:  Vitals:   07/18/19 1516  TempSrc:   PainSc: 0-No pain                 Cleda Mccreedy Kobi Aller

## 2019-07-18 NOTE — Interval H&P Note (Signed)
History and Physical Interval Note:  07/18/2019 2:32 PM  Deanna Joseph  has presented today for surgery, with the diagnosis of Intractable nausea and vomiting, unintentional weight loss.  The various methods of treatment have been discussed with the patient and family. After consideration of risks, benefits and other options for treatment, the patient has consented to  Procedure(s): ESOPHAGOGASTRODUODENOSCOPY (EGD) (N/A) as a surgical intervention.  The patient's history has been reviewed, patient examined, no change in status, stable for surgery.  I have reviewed the patient's chart and labs.  Questions were answered to the patient's satisfaction.     Mina, [place]

## 2019-07-18 NOTE — Anesthesia Preprocedure Evaluation (Signed)
Anesthesia Evaluation  Patient identified by MRN, date of birth, ID band Patient awake    Reviewed: Allergy & Precautions, H&P , NPO status , Patient's Chart, lab work & pertinent test results  History of Anesthesia Complications Negative for: history of anesthetic complications  Airway Mallampati: III  TM Distance: >3 FB Neck ROM: limited    Dental  (+) Chipped, Poor Dentition, Missing   Pulmonary neg pulmonary ROS, neg shortness of breath,           Cardiovascular Exercise Tolerance: Good (-) angina(-) Past MI and (-) DOE negative cardio ROS       Neuro/Psych negative neurological ROS  negative psych ROS   GI/Hepatic Neg liver ROS, GERD  Medicated and Controlled,  Endo/Other  diabetes, Poorly Controlled, Type 2, Insulin Dependent  Renal/GU Renal disease  negative genitourinary   Musculoskeletal   Abdominal   Peds  Hematology negative hematology ROS (+)   Anesthesia Other Findings Past Medical History: No date: Diabetes mellitus without complication (HCC)  Past Surgical History: No date: CESAREAN SECTION No date: CHOLECYSTECTOMY No date: DILATION AND CURETTAGE OF UTERUS  BMI    Body Mass Index: 22.82 kg/m      Reproductive/Obstetrics negative OB ROS                             Anesthesia Physical Anesthesia Plan  ASA: III  Anesthesia Plan: General   Post-op Pain Management:    Induction: Intravenous  PONV Risk Score and Plan: Propofol infusion and TIVA  Airway Management Planned: Natural Airway and Nasal Cannula  Additional Equipment:   Intra-op Plan:   Post-operative Plan:   Informed Consent: I have reviewed the patients History and Physical, chart, labs and discussed the procedure including the risks, benefits and alternatives for the proposed anesthesia with the patient or authorized representative who has indicated his/her understanding and acceptance.      Dental Advisory Given  Plan Discussed with: Anesthesiologist, CRNA and Surgeon  Anesthesia Plan Comments: (Patient consented for risks of anesthesia including but not limited to:  - adverse reactions to medications - risk of intubation if required - damage to teeth, lips or other oral mucosa - sore throat or hoarseness - Damage to heart, brain, lungs or loss of life  Patient voiced understanding.)        Anesthesia Quick Evaluation

## 2019-07-18 NOTE — Progress Notes (Signed)
[facility] Progress Note  [name]    CNO:[id number]  DOA: 07/17/2019     Date of Service: the patient was seen and examined on 07/18/2019  Chief Complaint  Patient presents with   Emesis   Brief hospital course: Deanna Joseph is a 54 y.o. female with medical history significant for diabetes and hypertension, no longer on medication due to blood sugar from recent unintentional weight loss, as well as history of chronic active gastritis and H. pylori on EGD in 2015, who presents to the emergency room with intractable vomiting, unable to hold anything down and a 30 pound weight loss in the past 3 months.  Patient had a similar presentation back in 2015 when she had her endoscopy.  She denies fever, chills, cough, has minimal epigastric pain.  Vomiting occurs after ingestion of any food stuff and consists of ingested food products, no blood no bile and no coffee grounds.  She denies change in bowel habit.  Denies chest pains or shortness of breath  Currently further plan is continues to have nausea and vomiting.  Assessment and Plan: acute on chronic gastritis Suspect diabetic gastroparesis History of H. pylori infection -Lipase elevated but possibly reactive -Patient presented with intractable vomiting with nausea and has history of positive H. pylori on EGD in 2015 -IV Protonix -Gastroenterology consulted, appreciate assistance. SP EGD. We will initiate Reglan. Reportedly we cannot perform nuclear medicine gastric emptying study in the hospital.    AKI (acute kidney injury) ([place])   Metabolic acidosis   Hypokalemia -Prerenal ATN secondary to acute on chronic vomiting -IV hydration with LR -Monitor renal function and avoid nephrotoxins    Elevated lipase -Likely reactive but continue to monitor and keep n.p.o. -CT abdomen and pelvis showed no acute findings    Unintentional weight loss of more than 10 pounds in 90 days  -Nutritionist consult     Diet-controlled diabetes mellitus ([place]) Uncontrolled with hypoglycemia -Patient no longer on medication, related to ongoing weight loss Sugars are running low although now that the patient is going to be on diet and anticipating improvement.  Diet: Full liquid diet DVT Prophylaxis: Subcutaneous Heparin    Advance goals of care discussion: Full code  Family Communication: no family was present at bedside, at the time of interview.   Disposition:  Pt is from home, admitted with intractable nausea and vomiting, still has vomiting ongoing, which precludes a safe discharge. Discharge to home, when able to tolerate oral diet.  Subjective: Continues to have vomiting.  No abdominal pain.  Passing gas.  No fever no chills.  Reports early satiety.  Physical Exam: General:  alert oriented to time, place, and person.  Appear in marked distress, affect appropriate Eyes: PERRL ENT: Oral Mucosa Clear, moist  Neck: no JVD,  Cardiovascular: S1 and S2 Present, no Murmur,  Respiratory: good respiratory effort, Bilateral Air entry equal and Decreased, no Crackles, no wheezes Abdomen: Bowel Sound present, Soft and no tenderness,  Skin: no rash Extremities: no Pedal edema, no calf tenderness Neurologic: without any new focal findings  Gait not checked due to patient safety concerns  Vitals:   07/18/19 1506 07/18/19 1516 07/18/19 1535 07/18/19 1639  BP: (!) 93/52 (!) 86/56 (!) 99/59 (!) 93/55  Pulse: 80 76 78 75  Resp: 17 14 18 20   Temp:   (!) 97.3 F (36.3 C) 97.7 F (36.5 C)  TempSrc:   Oral Oral  SpO2: 100% 100% 100% 100%  Weight:  Height:        Intake/Output Summary (Last 24 hours) at 07/18/2019 1924 Last data filed at 07/18/2019 0400 Gross per 24 hour  Intake 660.49 ml  Output --  Net 660.49 ml   Filed Weights   07/17/19 1755  Weight: 54.8 kg    Data Reviewed: I have personally reviewed and interpreted daily labs, tele strips, imagings as discussed above. I reviewed all  nursing notes, pharmacy notes, vitals, pertinent old records I have discussed plan of care as described above with RN and patient/family.  CBC: Recent Labs  Lab 07/17/19 1809 07/17/19 2104 07/18/19 0253  WBC 8.1 8.6 6.0  HGB 15.0 13.3 12.0  HCT 44.5 39.5 35.2*  MCV 80.2 81.6 80.2  PLT 270 210 156   Basic Metabolic Panel: Recent Labs  Lab 07/17/19 1809 07/18/19 0253  NA 136 142  K 3.2* 3.9  CL 94* 108  CO2 19* 20*  GLUCOSE 176* 86  BUN 32* 27*  CREATININE 1.18* 0.85  CALCIUM 9.7 8.3*    Studies: CT ABDOMEN PELVIS W CONTRAST  Result Date: 07/17/2019 CLINICAL DATA:  54 year old female with nausea vomiting. EXAM: CT ABDOMEN AND PELVIS WITH CONTRAST TECHNIQUE: Multidetector CT imaging of the abdomen and pelvis was performed using the standard protocol following bolus administration of intravenous contrast. CONTRAST:  24mL OMNIPAQUE IOHEXOL 300 MG/ML  SOLN COMPARISON:  None. FINDINGS: Lower chest: There is a 4 mm right lower lobe nodule (series 4, image 3), possibly a partially calcified granuloma. The visualized lung bases are otherwise clear. No intra-abdominal free air or free fluid. Hepatobiliary: The liver is unremarkable. No intrahepatic biliary duct dilatation. Cholecystectomy. Pancreas: Unremarkable. No pancreatic ductal dilatation or surrounding inflammatory changes. Spleen: Normal in size without focal abnormality. Adrenals/Urinary Tract: The adrenal glands are unremarkable. The kidneys, visualized ureters, and urinary bladder appear unremarkable. Stomach/Bowel: There is moderate stool throughout the colon. There is no bowel obstruction or active inflammation. Normal appendix. Vascular/Lymphatic: Minimal aortoiliac atherosclerotic disease. The IVC is unremarkable. No portal venous gas. There is no adenopathy. Reproductive: Hysterectomy.  No adnexal masses. Other: None Musculoskeletal: No acute or significant osseous findings. IMPRESSION: 1. No acute intra-abdominopelvic pathology.  2. Mild Aortic Atherosclerosis (ICD10-I70.0). Electronically Signed   By: Elgie Collard M.D.   On: 07/17/2019 19:54    Scheduled Meds:  enoxaparin (LOVENOX) injection  40 mg Subcutaneous Q24H   insulin aspart  0-9 Units Subcutaneous TID WC   metoCLOPramide (REGLAN) injection  5 mg Intravenous Q8H   pantoprazole (PROTONIX) IV  40 mg Intravenous Q24H   Continuous Infusions:  lactated ringers 75 mL/hr at 07/18/19 1857   PRN Meds: acetaminophen **OR** acetaminophen, ondansetron **OR** ondansetron (ZOFRAN) IV, promethazine  Time spent: 35 minutes  Author: Lynden Oxford, MD [facility] 07/18/2019 7:24 PM  To reach On-call, see care teams to locate the attending and reach out to them via [email or web address]. If 7PM-7AM, please contact night-coverage If you still have difficulty reaching the attending provider, please page the [facility] (Director on Call) for [facility] on amion for assistance.

## 2019-07-18 NOTE — Op Note (Signed)
[facility] Gastroenterology Patient Name: Deanna Joseph Procedure Date: 07/18/2019 2:34 PM MRN: [id number] Account #: [id number] Date of Birth: 28-Jan-1966 Admit Type: Outpatient Age: 54 Room: [facility] ENDO ROOM 1 Gender: Female Note Status: Finalized Procedure:             Upper GI endoscopy Indications:           Nausea with vomiting Providers:             Boykin Nearing. Daliana Leverett MD, MD Medicines:             Propofol per Anesthesia Complications:         No immediate complications. Procedure:             Pre-Anesthesia Assessment:                        - The risks and benefits of the procedure and the                         sedation options and risks were discussed with the                         patient. All questions were answered and informed                         consent was obtained.                        - Patient identification and proposed procedure were                         verified prior to the procedure by the nurse. The                         procedure was verified in the procedure room.                        - ASA Grade Assessment: III - A patient with severe                         systemic disease.                        - After reviewing the risks and benefits, the patient                         was deemed in satisfactory condition to undergo the                         procedure.                        After obtaining informed consent, the endoscope was                         passed under direct vision. Throughout the procedure,                         the patient's blood pressure, pulse, and oxygen  saturations were monitored continuously. The Endoscope                         was introduced through the mouth, and advanced to the                         third part of duodenum. The upper GI endoscopy was                         accomplished without difficulty. The patient tolerated                         the  procedure well. Findings:      The esophagus was normal.      Scattered moderate inflammation characterized by linear erosions was       found in the entire examined stomach. Biopsies were taken with a cold       forceps for Helicobacter pylori testing.      A 1 cm hiatal hernia was present.      The examined duodenum was normal.      The exam was otherwise without abnormality. Impression:            - Normal esophagus.                        - Chronic gastritis. Biopsied.                        - 1 cm hiatal hernia.                        - Normal examined duodenum.                        - The examination was otherwise normal. Recommendation:        - Return patient to hospital ward for ongoing care.                        - Await pathology results.                        - Full liquid diet.                        - Advance diet as tolerated.                        - Continue present medications.                        - The findings and recommendations were discussed with                         the patient. Procedure Code(s):     --- Professional ---                        [phone], Esophagogastroduodenoscopy, flexible,                         transoral; with biopsy, single or multiple Diagnosis Code(s):     --- Professional ---  R11.2, Nausea with vomiting, unspecified                        K44.9, Diaphragmatic hernia without obstruction or                         gangrene                        K29.50, Unspecified chronic gastritis without bleeding CPT copyright 2019 American Medical Association. All rights reserved. The codes documented in this report are preliminary and upon coder review may  be revised to meet current compliance requirements. [name] MD, MD 07/18/2019 2:45:07 PM This report has been signed electronically. Number of Addenda: 0 Note Initiated On: 07/18/2019 2:34 PM Estimated Blood Loss:  Estimated blood loss: none.      [facility]

## 2019-07-18 NOTE — H&P (View-Only) (Signed)
GI Inpatient Consult Note  Reason for Consult: Intractable nausea and vomiting, unintentional weight loss   Attending Requesting Consult: Dr. Berle Mull, MD  History of Present Illness: Deanna Joseph is a 54 y.o. female seen for evaluation of intractable nausea and vomiting and unintentional weight loss at the request of Dr. Posey Pronto.  Patient has a PMH of T2DM and HTN not currently on any diabetic medication presented to the ER last night for intractable nausea and vomiting and 39-pound unintentional weight loss over the past 3 months.  She reports that postprandial vomiting has been occurring since January 2021. Patient also has a history of H. pylori gastritis diagnosed in 2015.  Patient reports she had similar clinical presentation back in 2015 before she was diagnosed with H pylori gastritis.  She reports associated epigastric abdominal pain described as mild in severity without any associated symptoms of fever, dysphagia, or odynophagia.  She reports that vomiting occurs after any amount oral intake, worse with solid food.  She denies any bilious or bloody emesis.  She denies any changes in her bowel habits.  She reports her PCP was making changes to her diabetic regimen because her A1c was >11%, but eventually come down to 6% over the past month.  She denies any hematochezia or melena. Upon presentation to the ED, patient was hypotensive at 93/49 and tachycardic at 127 which improved with fluid hydration.  She had CBC showing no evidence of leukocytosis or anemia.  Lipase was mildly elevated at 83, total bili 1.9 with normal alk phos.  Patient had cross-sectional imaging with no acute findings.  She is s/p cholecystectomy.   Last Colonoscopy: 2015 -normal-appearing colon, random biopsies negative for microscopic colitis Last Endoscopy: 2015 -Barrett's esophagus, H pylori gastritis, duodenal biopsies negative for features of celiac disease   Past Medical History:  Past Medical History:   Diagnosis Date  . Diabetes mellitus without complication [facility])     Problem List: Patient Active Problem List   Diagnosis Date Noted  . Gastroparesis 07/18/2019  . Intractable vomiting with nausea 07/17/2019  . Unintentional weight loss of more than 10 pounds in 90 days 07/17/2019  . History of chronic gastritis 07/17/2019  . History of Helicobacter pylori infection 07/17/2019  . Diet-controlled diabetes mellitus ([place]) 07/17/2019  . AKI (acute kidney injury) ([place]) 07/17/2019  . Metabolic acidosis 82/95/6213  . Hypokalemia 07/17/2019  . Hypotension 07/17/2019    Past Surgical History: Past Surgical History:  Procedure Laterality Date  . CESAREAN SECTION    . CHOLECYSTECTOMY    . DILATION AND CURETTAGE OF UTERUS      Allergies: Allergies  Allergen Reactions  . Penicillins     Rash     Home Medications: Medications Prior to Admission  Medication Sig Dispense Refill Last Dose  . aspirin EC 81 MG tablet Take 81 mg by mouth daily.   07/16/2019 at Unknown time  . atorvastatin (LIPITOR) 20 MG tablet Take 20 mg by mouth daily.   07/16/2019 at Unknown time  . cetirizine (ZYRTEC) 10 MG tablet Take 10 mg by mouth daily.   07/16/2019 at Unknown time  . gabapentin (NEURONTIN) 300 MG capsule Take 300 mg by mouth at bedtime.   07/16/2019 at Unknown time  . omeprazole (PRILOSEC) 20 MG capsule Take 20 mg by mouth daily.   07/16/2019 at Unknown time  . PARoxetine (PAXIL) 40 MG tablet Take 40 mg by mouth daily.   07/16/2019 at Unknown time  . traMADol (ULTRAM) 50 MG tablet Take  1 tablet (50 mg total) by mouth every 6 (six) hours as needed. 15 tablet 0 07/16/2019 at Unknown time   Home medication reconciliation was completed with the patient.   Scheduled Inpatient Medications:   . enoxaparin (LOVENOX) injection  40 mg Subcutaneous Q24H  . insulin aspart  0-9 Units Subcutaneous TID WC  . metoCLOPramide (REGLAN) injection  5 mg Intravenous Q8H  . pantoprazole (PROTONIX) IV  40 mg Intravenous Q24H     Continuous Inpatient Infusions:   . dextrose 50 mL/hr at 07/18/19 1214  . lactated ringers 75 mL/hr at 07/18/19 1045    PRN Inpatient Medications:  acetaminophen **OR** acetaminophen, ondansetron **OR** ondansetron (ZOFRAN) IV, promethazine  Family History: family history includes Breast cancer (age of onset: 56) in her maternal aunt.  The patient's family history is negative for inflammatory bowel disorders, GI malignancy, or solid organ transplantation.  Social History:   reports that she has never smoked. She has never used smokeless tobacco. The patient denies ETOH, tobacco, or drug use.   Review of Systems: Constitutional: Weight is stable.  Eyes: No changes in vision. ENT: No oral lesions, sore throat.  GI: see HPI.  Heme/Lymph: No easy bruising.  CV: No chest pain.  GU: No hematuria.  Integumentary: No rashes.  Neuro: No headaches.  Psych: No depression/anxiety.  Endocrine: No heat/cold intolerance.  Allergic/Immunologic: No urticaria.  Resp: No cough, SOB.  Musculoskeletal: No joint swelling.    Physical Examination: BP 96/62 (BP Location: Right Arm)   Pulse 81   Temp 98.3 F (36.8 C) (Oral)   Resp 18   Ht _0  (1.549 m)   Wt 54.8 kg   SpO2 100%   BMI 22.82 kg/m  Gen: NAD, alert and oriented x 4 HEENT: PEERLA, EOMI, Neck: supple, no JVD or thyromegaly Chest: CTA bilaterally, no wheezes, crackles, or other adventitious sounds CV: RRR, no m/g/c/r Abd: soft, NT, ND, +BS in all four quadrants; no HSM, guarding, ridigity, or rebound tenderness Ext: no edema, well perfused with 2+ pulses, Skin: no rash or lesions noted Lymph: no LAD  Data: Lab Results  Component Value Date   WBC 6.0 07/18/2019   HGB 12.0 07/18/2019   HCT 35.2 (L) 07/18/2019   MCV 80.2 07/18/2019   PLT 156 07/18/2019   Recent Labs  Lab 07/17/19 1809 07/17/19 2104 07/18/19 0253  HGB 15.0 13.3 12.0   Lab Results  Component Value Date   NA 142 07/18/2019   K 3.9 07/18/2019    CL 108 07/18/2019   CO2 20 (L) 07/18/2019   BUN 27 (H) 07/18/2019   CREATININE 0.85 07/18/2019   Lab Results  Component Value Date   ALT 22 07/17/2019   AST 25 07/17/2019   ALKPHOS 109 07/17/2019   BILITOT 1.9 (H) 07/17/2019   No results for input(s): APTT, INR, PTT in the last 168 hours. Assessment/Plan:  54 year old Caucasian female with a PMH of H. pylori gastritis 2015, T2DM, HTN, s/p cholecystectomy admitted for intractable nausea and vomiting and 39-pound unintentional weight loss x3 months  1.  Intractable nausea and vomiting 2.  Unintentional weight loss-39 pounds x 3 months 3.  Uncontrolled T2DM  -Differential includes diabetic gastroparesis, gastric outlet obstruction, gastritis +/-H. pylori, dietary intolerances, PUD, functional -Recommend EGD for luminal evaluation to rule out esophagitis, GOO, gastritis, duodenitis, and to perform esophageal/gastric biopsies.  We discussed procedure details and indications today in hospital room.  Patient had her husband on speaker phone and he was involved in the plan  of care. -Plan for EGD this afternoon with Dr. Mare Ferrari -Remain NPO -If EGD is negative, would recommend gastric emptying scan to assess for gastroparesis -Patient would benefit from stricter glycemic control -Further recommendations after procedure  I reviewed the risks (including bleeding, perforation, infection, anesthesia complications, cardiac/respiratory complications), benefits and alternatives of EGD. Patient consents to proceed.     Thank you for the consult. Please call with questions or concerns.  Reeves Forth [facility]enterology [phone] (Cell)

## 2019-07-18 NOTE — Transfer of Care (Signed)
Immediate Anesthesia Transfer of Care Note  [name]  Procedure(s) Performed: ESOPHAGOGASTRODUODENOSCOPY (EGD) (N/A )  Patient Location: PACU  Anesthesia Type:General  Level of Consciousness: sedated  Airway & Oxygen Therapy: Patient Spontanous Breathing and Patient connected to nasal cannula oxygen  Post-op Assessment: Report given to RN and Post -op Vital signs reviewed and stable  Post vital signs: Reviewed and stable  Last Vitals:  Vitals Value Taken Time  BP    Temp    Pulse    Resp    SpO2      Last Pain:  Vitals:   07/18/19 1419  TempSrc: Temporal  PainSc: 0-No pain         Complications: No apparent anesthesia complications

## 2019-07-18 NOTE — TOC Initial Note (Signed)
Transition of Care [facility]) - Initial/Assessment Note    Patient Details  Name: Deanna Joseph MRN: [id number] Date of Birth: 06-12-1965  Transition of Care [facility]) CM/SW Contact:    Elease Hashimoto, LCSW Phone Number: 07/18/2019, 11:54 AM  Clinical Narrative:  Met with pt to discuss discharge needs. Pt reports she lives with her disabled husband who has had mini strokes and receives SSD and Medicare. She has two grown children-24 & 27 and he has three grown children-all are involved and check on them. She and husband have been married for 13 years. She has applied for disability herself but has been denied and denied Medicaid due to husband makes too much disability. Husband is safe to be home alone but pt has a friend checking on him while she is here. 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 She is connected with the [facility] for her medical care and assistance with her medicines. She has lost 40 lbs in three months and hopes to find out while here why. Pt is very personable and willing to share information. She is motivated to find out what is causing her health issues and will change her habits to improve them. Will ask financial counselor to call her and see if can apply for medicaid while here and pt aware can go on-line to apply for Social Security. Will follow while here to assist with discharge needs.                Expected Discharge Plan: Home/Self Care Barriers to Discharge: Inadequate or no insurance, Continued Medical Work up   Patient Goals and CMS Choice Patient states their goals for this hospitalization and ongoing recovery are:: I want them to figure out what is wrong with me and then get better.      Expected Discharge Plan and Services Expected Discharge Plan: Home/Self Care In-house Referral: Clinical Social Work     Living arrangements for the past 2 months: Single Family Home                                      Prior  Living Arrangements/Services Living arrangements for the past 2 months: Single Family Home Lives with:: Spouse   Do you feel safe going back to the place where you live?: Yes      Need for Family Participation in Patient Care: No (Comment) Care giver support system in place?: Yes (comment)      Activities of Daily Living Home Assistive Devices/Equipment: None ADL Screening (condition at time of admission) Patient's cognitive ability adequate to safely complete daily activities?: Yes Is the patient deaf or have difficulty hearing?: No Does the patient have difficulty seeing, even when wearing glasses/contacts?: No Does the patient have difficulty concentrating, remembering, or making decisions?: No Patient able to express need for assistance with ADLs?: Yes Does the patient have difficulty dressing or bathing?: No Independently performs ADLs?: Yes (appropriate for developmental age) Does the patient have difficulty walking or climbing stairs?: Yes Weakness of Legs: Right Weakness of Arms/Hands: None  Permission Sought/Granted                  Emotional Assessment Appearance:: Appears stated age Attitude/Demeanor/Rapport: Engaged, Gracious Affect (typically observed): Adaptable, Accepting Orientation: : Oriented to Self, Oriented to Place, Oriented to  Time, Oriented to Situation Alcohol / Substance Use: Never Used Psych Involvement:  No (comment)  Admission diagnosis:  Weight loss [R63.4] Intractable vomiting [R11.10] Idiopathic acute pancreatitis without infection or necrosis [K85.00] Intractable vomiting with nausea, unspecified vomiting type [R11.2] Gastroparesis [K31.84] Patient Active Problem List   Diagnosis Date Noted  . Gastroparesis 07/18/2019  . Intractable vomiting with nausea 07/17/2019  . Unintentional weight loss of more than 10 pounds in 90 days 07/17/2019  . History of chronic gastritis 07/17/2019  . History of Helicobacter pylori infection 07/17/2019   . Diet-controlled diabetes mellitus ([place]) 07/17/2019  . AKI (acute kidney injury) ([place]) 07/17/2019  . Metabolic acidosis 78/58/8502  . Hypokalemia 07/17/2019  . Hypotension 07/17/2019   PCP:  Denton Lank, MD Pharmacy:   [facility] [address] [place] [address] Phone: [phone] Fax: [phone]     Social Determinants of Health (SDOH) Interventions    Readmission Risk Interventions No flowsheet data found.

## 2019-07-18 NOTE — Progress Notes (Addendum)
Initial Nutrition Assessment  DOCUMENTATION CODES:   Not applicable  INTERVENTION:   Ensure Enlive po TID, each supplement provides 350 kcal and 20 grams of protein  MVI daily   Pt at high refeed risk; recommend monitor K, Mg and P labs daily as oral intake improves  NUTRITION DIAGNOSIS:   Unintentional weight loss related to acute illness(nausea, vomiting and abdominal pain) as evidenced by 28 percent weight loss in 4 months.   GOAL:   Patient will meet greater than or equal to 90% of their needs  MONITOR:   PO intake, Supplement acceptance, Labs, Weight trends, Skin, I & O's  REASON FOR ASSESSMENT:   Malnutrition Screening Tool    ASSESSMENT:   54 year old Caucasian female with a PMH of H. pylori gastritis 2015, T2DM, HTN, s/p cholecystectomy admitted for intractable nausea and vomiting and significant weight loss x 3 months  Unable to see patient as pt having EGD at time of RD visit. Per chart review, pt with poor appetite and oral intake for the past 3 months r/t nausea, vomiting and abdominal pain. Pt reports that she is unable to keep down solid foods. Pt reports a significant weight loss over the past 3 months. Per chart, pt has lost 46lbs(28%) since October 2020 which is severe weight loss; this is based on one weight from 03/16/2019 and pt's admit weight. It does appear from previous years that pt's UBW is ~165lbs. Pt currently NPO for EGD today. RD will add supplements once diet advanced. Will obtain nutrition related history and exam at follow-up. Pt is at high risk for malnutrition.   Medications reviewed and include: lovenox, insulin, reglan, protonix, LRS @75ml /hr  Labs reviewed: K 3.9 wnl, BUN 27(H) cbgs- 66, 133, 85 x 24 hrs  Unable to complete Nutrition-Focused physical exam at this time.   Diet Order:   Diet Order            Diet NPO time specified Except for: Ice Chips  Diet effective now             EDUCATION NEEDS:   Not appropriate for  education at this time  Skin:  Skin Assessment: Reviewed RN Assessment  Last BM:  1/5  Height:   Ht Readings from Last 1 Encounters:  07/17/19 5\' 1"  (1.549 m)    Weight:   Wt Readings from Last 1 Encounters:  07/17/19 54.8 kg    Ideal Body Weight:  47.7 kg  BMI:  Body mass index is 22.82 kg/m.  Estimated Nutritional Needs:   Kcal:  1500-1700kcal/day  Protein:  75-85g/day  Fluid:  >1.4L/day  MS, RD, LDN Contact information available in Amion

## 2019-07-18 NOTE — Consult Note (Signed)
GI Inpatient Consult Note  Reason for Consult: Intractable nausea and vomiting, unintentional weight loss   Attending Requesting Consult: Dr. Pranav Patel, MD  History of Present Illness: Deanna Joseph is a 54 y.o. female seen for evaluation of intractable nausea and vomiting and unintentional weight loss at the request of Dr. Patel.  Patient has a PMH of T2DM and HTN not currently on any diabetic medication presented to the ER last night for intractable nausea and vomiting and 39-pound unintentional weight loss over the past 3 months.  She reports that postprandial vomiting has been occurring since January 2021. Patient also has a history of H. pylori gastritis diagnosed in 2015.  Patient reports she had similar clinical presentation back in 2015 before she was diagnosed with H pylori gastritis.  She reports associated epigastric abdominal pain described as mild in severity without any associated symptoms of fever, dysphagia, or odynophagia.  She reports that vomiting occurs after any amount oral intake, worse with solid food.  She denies any bilious or bloody emesis.  She denies any changes in her bowel habits.  She reports her PCP was making changes to her diabetic regimen because her A1c was >11%, but eventually come down to 6% over the past month.  She denies any hematochezia or melena. Upon presentation to the ED, patient was hypotensive at 93/49 and tachycardic at 127 which improved with fluid hydration.  She had CBC showing no evidence of leukocytosis or anemia.  Lipase was mildly elevated at 83, total bili 1.9 with normal alk phos.  Patient had cross-sectional imaging with no acute findings.  She is s/p cholecystectomy.   Last Colonoscopy: 2015 -normal-appearing colon, random biopsies negative for microscopic colitis Last Endoscopy: 2015 -Barrett's esophagus, H pylori gastritis, duodenal biopsies negative for features of celiac disease   Past Medical History:  Past Medical History:   Diagnosis Date  . Diabetes mellitus without complication (HCC)     Problem List: Patient Active Problem List   Diagnosis Date Noted  . Gastroparesis 07/18/2019  . Intractable vomiting with nausea 07/17/2019  . Unintentional weight loss of more than 10 pounds in 90 days 07/17/2019  . History of chronic gastritis 07/17/2019  . History of Helicobacter pylori infection 07/17/2019  . Diet-controlled diabetes mellitus (HCC) 07/17/2019  . AKI (acute kidney injury) (HCC) 07/17/2019  . Metabolic acidosis 07/17/2019  . Hypokalemia 07/17/2019  . Hypotension 07/17/2019    Past Surgical History: Past Surgical History:  Procedure Laterality Date  . CESAREAN SECTION    . CHOLECYSTECTOMY    . DILATION AND CURETTAGE OF UTERUS      Allergies: Allergies  Allergen Reactions  . Penicillins     Rash     Home Medications: Medications Prior to Admission  Medication Sig Dispense Refill Last Dose  . aspirin EC 81 MG tablet Take 81 mg by mouth daily.   07/16/2019 at Unknown time  . atorvastatin (LIPITOR) 20 MG tablet Take 20 mg by mouth daily.   07/16/2019 at Unknown time  . cetirizine (ZYRTEC) 10 MG tablet Take 10 mg by mouth daily.   07/16/2019 at Unknown time  . gabapentin (NEURONTIN) 300 MG capsule Take 300 mg by mouth at bedtime.   07/16/2019 at Unknown time  . omeprazole (PRILOSEC) 20 MG capsule Take 20 mg by mouth daily.   07/16/2019 at Unknown time  . PARoxetine (PAXIL) 40 MG tablet Take 40 mg by mouth daily.   07/16/2019 at Unknown time  . traMADol (ULTRAM) 50 MG tablet Take   1 tablet (50 mg total) by mouth every 6 (six) hours as needed. 15 tablet 0 07/16/2019 at Unknown time   Home medication reconciliation was completed with the patient.   Scheduled Inpatient Medications:   . enoxaparin (LOVENOX) injection  40 mg Subcutaneous Q24H  . insulin aspart  0-9 Units Subcutaneous TID WC  . metoCLOPramide (REGLAN) injection  5 mg Intravenous Q8H  . pantoprazole (PROTONIX) IV  40 mg Intravenous Q24H     Continuous Inpatient Infusions:   . dextrose 50 mL/hr at 07/18/19 1214  . lactated ringers 75 mL/hr at 07/18/19 1045    PRN Inpatient Medications:  acetaminophen **OR** acetaminophen, ondansetron **OR** ondansetron (ZOFRAN) IV, promethazine  Family History: family history includes Breast cancer (age of onset: 55) in her maternal aunt.  The patient's family history is negative for inflammatory bowel disorders, GI malignancy, or solid organ transplantation.  Social History:   reports that she has never smoked. She has never used smokeless tobacco. The patient denies ETOH, tobacco, or drug use.   Review of Systems: Constitutional: Weight is stable.  Eyes: No changes in vision. ENT: No oral lesions, sore throat.  GI: see HPI.  Heme/Lymph: No easy bruising.  CV: No chest pain.  GU: No hematuria.  Integumentary: No rashes.  Neuro: No headaches.  Psych: No depression/anxiety.  Endocrine: No heat/cold intolerance.  Allergic/Immunologic: No urticaria.  Resp: No cough, SOB.  Musculoskeletal: No joint swelling.    Physical Examination: BP 96/62 (BP Location: Right Arm)   Pulse 81   Temp 98.3 F (36.8 C) (Oral)   Resp 18   Ht 5' 1" (1.549 m)   Wt 54.8 kg   SpO2 100%   BMI 22.82 kg/m  Gen: NAD, alert and oriented x 4 HEENT: PEERLA, EOMI, Neck: supple, no JVD or thyromegaly Chest: CTA bilaterally, no wheezes, crackles, or other adventitious sounds CV: RRR, no m/g/c/r Abd: soft, NT, ND, +BS in all four quadrants; no HSM, guarding, ridigity, or rebound tenderness Ext: no edema, well perfused with 2+ pulses, Skin: no rash or lesions noted Lymph: no LAD  Data: Lab Results  Component Value Date   WBC 6.0 07/18/2019   HGB 12.0 07/18/2019   HCT 35.2 (L) 07/18/2019   MCV 80.2 07/18/2019   PLT 156 07/18/2019   Recent Labs  Lab 07/17/19 1809 07/17/19 2104 07/18/19 0253  HGB 15.0 13.3 12.0   Lab Results  Component Value Date   NA 142 07/18/2019   K 3.9 07/18/2019    CL 108 07/18/2019   CO2 20 (L) 07/18/2019   BUN 27 (H) 07/18/2019   CREATININE 0.85 07/18/2019   Lab Results  Component Value Date   ALT 22 07/17/2019   AST 25 07/17/2019   ALKPHOS 109 07/17/2019   BILITOT 1.9 (H) 07/17/2019   No results for input(s): APTT, INR, PTT in the last 168 hours. Assessment/Plan:  53-year-old Caucasian female with a PMH of H. pylori gastritis 2015, T2DM, HTN, s/p cholecystectomy admitted for intractable nausea and vomiting and 39-pound unintentional weight loss x3 months  1.  Intractable nausea and vomiting 2.  Unintentional weight loss-39 pounds x 3 months 3.  Uncontrolled T2DM  -Differential includes diabetic gastroparesis, gastric outlet obstruction, gastritis +/-H. pylori, dietary intolerances, PUD, functional -Recommend EGD for luminal evaluation to rule out esophagitis, GOO, gastritis, duodenitis, and to perform esophageal/gastric biopsies.  We discussed procedure details and indications today in hospital room.  Patient had her husband on speaker phone and he was involved in the plan   of care. -Plan for EGD this afternoon with Dr. Kalita -Remain NPO -If EGD is negative, would recommend gastric emptying scan to assess for gastroparesis -Patient would benefit from stricter glycemic control -Further recommendations after procedure  I reviewed the risks (including bleeding, perforation, infection, anesthesia complications, cardiac/respiratory complications), benefits and alternatives of EGD. Patient consents to proceed.     Thank you for the consult. Please call with questions or concerns.  Henryk Ursin M Luie Laneve, PA-C [facility] [phone] (Cell)  

## 2019-07-19 ENCOUNTER — Encounter: Payer: Self-pay | Admitting: *Deleted

## 2019-07-19 LAB — BASIC METABOLIC PANEL
Anion gap: 15 (ref 5–15)
BUN: 15 mg/dL (ref 6–20)
CO2: 17 mmol/L — ABNORMAL LOW (ref 22–32)
Calcium: 8.4 mg/dL — ABNORMAL LOW (ref 8.9–10.3)
Chloride: 107 mmol/L (ref 98–111)
Creatinine, Ser: 0.83 mg/dL (ref 0.44–1.00)
GFR calc Af Amer: >60 mL/min (ref >60)
GFR calc non Af Amer: >60 mL/min (ref >60)
Glucose, Bld: 82 mg/dL (ref 70–99)
Potassium: 3.3 mmol/L — ABNORMAL LOW (ref 3.5–5.1)
Sodium: 139 mmol/L (ref 135–145)

## 2019-07-19 LAB — GLUCOSE, CAPILLARY
Glucose-Capillary: 75 mg/dL (ref 70–99)
Glucose-Capillary: 77 mg/dL (ref 70–99)
Glucose-Capillary: 75 mg/dL (ref 70–99)
Glucose-Capillary: 72 mg/dL (ref 70–99)

## 2019-07-19 LAB — HEMOGLOBIN A1C
Hgb A1c MFr Bld: 6.5 % — ABNORMAL HIGH (ref 4.8–5.6)
Mean Plasma Glucose: 140 mg/dL

## 2019-07-19 LAB — MAGNESIUM: Magnesium: 1.7 mg/dL (ref 1.7–2.4)

## 2019-07-19 MED ORDER — METOCLOPRAMIDE HCL 5 MG/ML IJ SOLN
5.0000 mg | Freq: Three times a day (TID) | INTRAMUSCULAR | Status: DC
  Administered 2019-07-19 – 2019-07-22 (×13): 5 mg via INTRAVENOUS
  Filled 2019-07-19 (×12): qty 2

## 2019-07-19 MED ORDER — ATORVASTATIN CALCIUM 20 MG PO TABS
20.0000 mg | ORAL_TABLET | Freq: Every day | ORAL | Status: DC
  Administered 2019-07-19 – 2019-07-29 (×11): 20 mg via ORAL
  Filled 2019-07-19 (×11): qty 1

## 2019-07-19 MED ORDER — PANTOPRAZOLE SODIUM 40 MG PO TBEC
40.0000 mg | DELAYED_RELEASE_TABLET | Freq: Every day | ORAL | Status: DC
  Administered 2019-07-19: 40 mg via ORAL
  Filled 2019-07-19: qty 1

## 2019-07-19 MED ORDER — PSYLLIUM 95 % PO PACK
1.0000 | PACK | Freq: Every day | ORAL | Status: DC
  Administered 2019-07-19 – 2019-07-22 (×4): 1 via ORAL
  Filled 2019-07-19 (×5): qty 1

## 2019-07-19 MED ORDER — PAROXETINE HCL 20 MG PO TABS
20.0000 mg | ORAL_TABLET | Freq: Every day | ORAL | Status: DC
  Administered 2019-07-19 – 2019-07-30 (×12): 20 mg via ORAL
  Filled 2019-07-19 (×12): qty 1

## 2019-07-19 NOTE — Plan of Care (Signed)

## 2019-07-19 NOTE — Progress Notes (Signed)
GI Inpatient Follow-up Note  Subjective:  Patient seen in follow-up for intractable nausea and vomiting.  She had EGD performed yesterday afternoon by Dr. Alice Reichert showing normal esophagus, chronic gastritis with biopsies pending, 1 cm hiatal hernia, normal examined duodenum.  Patient was started on Reglan last night for presumed diabetic gastroparesis.  She reports that she is currently without any symptoms of nausea or vomiting.  She denies any abdominal pain.  She reports she is tolerated some Jell-O and small piece of sausage for breakfast.  She is awaiting lunch where she is scheduled to get soup.  She denies any fevers, chills, night sweats.  No acute overnight events.  Scheduled Inpatient Medications:  . atorvastatin  20 mg Oral q1800  . enoxaparin (LOVENOX) injection  40 mg Subcutaneous Q24H  . insulin aspart  0-9 Units Subcutaneous TID WC  . metoCLOPramide (REGLAN) injection  5 mg Intravenous TID AC & HS  . pantoprazole  40 mg Oral Daily  . PARoxetine  20 mg Oral Daily  . psyllium  1 packet Oral Daily    Continuous Inpatient Infusions:   . lactated ringers 75 mL/hr at 07/19/19 1141    PRN Inpatient Medications:  acetaminophen **OR** acetaminophen, ondansetron **OR** ondansetron (ZOFRAN) IV  Review of Systems: Constitutional: Weight is stable.  Eyes: No changes in vision. ENT: No oral lesions, sore throat.  GI: see HPI.  Heme/Lymph: No easy bruising.  CV: No chest pain.  GU: No hematuria.  Integumentary: No rashes.  Neuro: No headaches.  Psych: No depression/anxiety.  Endocrine: No heat/cold intolerance.  Allergic/Immunologic: No urticaria.  Resp: No cough, SOB.  Musculoskeletal: No joint swelling.    Physical Examination: BP (!) 98/52 (BP Location: Right Arm)   Pulse 88   Temp 97.8 F (36.6 C) (Oral)   Resp 17   Ht 5\' 1"  (1.549 m)   Wt 54.8 kg   SpO2 98%   BMI 22.82 kg/m  Gen: NAD, alert and oriented x 4 HEENT: PEERLA, EOMI, Neck: supple, no JVD or  thyromegaly Chest: CTA bilaterally, no wheezes, crackles, or other adventitious sounds CV: RRR, no m/g/c/r Abd: soft, NT, ND, +BS in all four quadrants; no HSM, guarding, ridigity, or rebound tenderness Ext: no edema, well perfused with 2+ pulses, Skin: no rash or lesions noted Lymph: no LAD  Data: Lab Results  Component Value Date   WBC 6.0 07/18/2019   HGB 12.0 07/18/2019   HCT 35.2 (L) 07/18/2019   MCV 80.2 07/18/2019   PLT 156 07/18/2019   Recent Labs  Lab 07/17/19 1809 07/17/19 2104 07/18/19 0253  HGB 15.0 13.3 12.0   Lab Results  Component Value Date   NA 139 07/19/2019   K 3.3 (L) 07/19/2019   CL 107 07/19/2019   CO2 17 (L) 07/19/2019   BUN 15 07/19/2019   CREATININE 0.83 07/19/2019   Lab Results  Component Value Date   ALT 22 07/17/2019   AST 25 07/17/2019   ALKPHOS 109 07/17/2019   BILITOT 1.9 (H) 07/17/2019   No results for input(s): APTT, INR, PTT in the last 168 hours. Assessment/Plan:  54 year old Caucasian female with a PMH of H. pylori gastritis 2015, T2DM, HTN, s/p cholecystectomy admitted for intractable nausea and vomiting and 39-pound unintentional weight loss x3 months  1.  Intractable nausea and vomiting - improved 2.  Unintentional weight loss-39 pounds x 3 months 3.  Uncontrolled T2DM 4.  Likely diabetic gastroparesis   -EGD procedure report reviewed with patient. No food residue in stomach  to suggest gastroparesis, but she has been without adequate oral intake  -Would ideally like to perform gastric emptying scan in hospital, but per hospitalist this is not an option -Agree with Reglan 5 mg QID before meals and bedtime as needed -Discussed gastroparesis diet with a diet low in fat and insoluble fiber. She should be provided with diet sheets upon discharge and follow-up with our clinic as outpatient -Can consider GES as an outpatient for definitive dx -Nausea and vomiting are improved on Reglan. Continue with close monitoring. We  discussed potential side effect of tardive dyskinesia. -Soft diet for lunch and advance to low fat for dinner -We discussed importance of small meals throughout the day with adequate hydration -She is stable from GI standpoint for discharge with outpatient follow-up     Please call with questions or concerns.    Jacob Moores, PA-C [facility] [phone] (Cell)

## 2019-07-19 NOTE — Progress Notes (Signed)
[facility] Progress Note  [name]    MPN:[id number]  DOA: 07/17/2019     Date of Service: the patient was seen and examined on 07/19/2019  Chief Complaint  Patient presents with  . Emesis   Brief hospital course: Deanna Joseph is a 54 y.o. female with medical history significant for diabetes and hypertension, no longer on medication due to blood sugar from recent unintentional weight loss, as well as history of chronic active gastritis and H. pylori on EGD in 2015, who presents to the emergency room with intractable vomiting, unable to hold anything down and a 30 pound weight loss in the past 3 months.  Patient had a similar presentation back in 2015 when she had her endoscopy.  She denies fever, chills, cough, has minimal epigastric pain.  Vomiting occurs after ingestion of any food stuff and consists of ingested food products, no blood no bile and no coffee grounds.  She denies change in bowel habit.  Denies chest pains or shortness of breath  Currently further plan is continues to have nausea and vomiting.  Assessment and Plan: acute on chronic gastritis Suspect diabetic gastroparesis History of H. pylori infection -Lipase elevated but possibly reactive -Patient presented with intractable vomiting with nausea and has history of positive H. pylori on EGD in 2015 -IV Protonix -Gastroenterology consulted, appreciate assistance. SP EGD. We will initiate Reglan. Per Delice Bison from our radiology we cannot perform nuclear medicine gastric emptying study in the hospital. Also initiate Metamucil.    AKI (acute kidney injury) (HCC)   Metabolic acidosis   Hypokalemia -Prerenal ATN secondary to acute on chronic vomiting -IV hydration with LR -Monitor renal function and avoid nephrotoxins    Elevated lipase -Likely reactive but continue to monitor and keep n.p.o. -CT abdomen and pelvis showed no acute findings    Unintentional weight loss of more than 10  pounds in 90 days  -Nutritionist consult    Diet-controlled diabetes mellitus (HCC) Uncontrolled with hypoglycemia -Patient no longer on medication, related to ongoing weight loss Sugars are running low although now that the patient is going to be on diet and anticipating improvement.  Diet: Soft diet diet DVT Prophylaxis: Subcutaneous Heparin    Advance goals of care discussion: Full code  Family Communication: no family was present at bedside, at the time of interview.   Disposition:  Pt is from home, admitted with intractable nausea and vomiting, still has vomiting ongoing, which precludes a safe discharge. Discharge to home, when able to tolerate oral diet.  Subjective: No nausea no vomiting no fever no chills no chest pain abdominal pain.  Not passing gas.  Per patient last BM was 06/12/2019.  CT does not show significant stool burden.  Physical Exam: General:  alert oriented to time, place, and person.  Appear in marked distress, affect appropriate Eyes: PERRL ENT: Oral Mucosa Clear, moist  Neck: no JVD,  Cardiovascular: S1 and S2 Present, no Murmur,  Respiratory: good respiratory effort, Bilateral Air entry equal and Decreased, no Crackles, no wheezes Abdomen: Bowel Sound present, Soft and no tenderness,  Skin: no rash Extremities: no Pedal edema, no calf tenderness Neurologic: without any new focal findings  Gait not checked due to patient safety concerns  Vitals:   07/18/19 2332 07/19/19 0539 07/19/19 0755 07/19/19 1607  BP: (!) 99/57 (!) 101/52 (!) 98/52 (!) 116/54  Pulse: 86 78 88 84  Resp: 16 16 17 18   Temp: 97.8 F (36.6 C) 97.8 F (36.6 C)  97.8 F (36.6 C) 98.4 F (36.9 C)  TempSrc:   Oral Oral  SpO2: 100%  98% 100%  Weight:      Height:        Intake/Output Summary (Last 24 hours) at 07/19/2019 1954 Last data filed at 07/19/2019 1554 Gross per 24 hour  Intake 1457.84 ml  Output --  Net 1457.84 ml   Filed Weights   07/17/19 1755  Weight: 54.8 kg     Data Reviewed: I have personally reviewed and interpreted daily labs, tele strips, imagings as discussed above. I reviewed all nursing notes, pharmacy notes, vitals, pertinent old records I have discussed plan of care as described above with RN and patient/family.  CBC: Recent Labs  Lab 07/17/19 1809 07/17/19 2104 07/18/19 0253  WBC 8.1 8.6 6.0  HGB 15.0 13.3 12.0  HCT 44.5 39.5 35.2*  MCV 80.2 81.6 80.2  PLT 270 210 867   Basic Metabolic Panel: Recent Labs  Lab 07/17/19 1809 07/18/19 0253 07/19/19 1111  NA 136 142 139  K 3.2* 3.9 3.3*  CL 94* 108 107  CO2 19* 20* 17*  GLUCOSE 176* 86 82  BUN 32* 27* 15  CREATININE 1.18* 0.85 0.83  CALCIUM 9.7 8.3* 8.4*  MG  --   --  1.7    Studies: No results found.  Scheduled Meds: . atorvastatin  20 mg Oral q1800  . enoxaparin (LOVENOX) injection  40 mg Subcutaneous Q24H  . insulin aspart  0-9 Units Subcutaneous TID WC  . metoCLOPramide (REGLAN) injection  5 mg Intravenous TID AC & HS  . pantoprazole  40 mg Oral Daily  . PARoxetine  20 mg Oral Daily  . psyllium  1 packet Oral Daily   Continuous Infusions: . lactated ringers 75 mL/hr at 07/19/19 1141   PRN Meds: acetaminophen **OR** acetaminophen, ondansetron **OR** ondansetron (ZOFRAN) IV  Time spent: 35 minutes  Author: Berle Mull, MD [facility] 07/19/2019 7:54 PM  To reach On-call, see care teams to locate the attending and reach out to them via [email or web address]. If 7PM-7AM, please contact night-coverage If you still have difficulty reaching the attending provider, please page the [facility] (Director on Call) for [facility] on amion for assistance.

## 2019-07-20 ENCOUNTER — Inpatient Hospital Stay: Payer: Self-pay

## 2019-07-20 DIAGNOSIS — E1169 Type 2 diabetes mellitus with other specified complication: Secondary | ICD-10-CM

## 2019-07-20 DIAGNOSIS — E876 Hypokalemia: Secondary | ICD-10-CM

## 2019-07-20 DIAGNOSIS — R531 Weakness: Secondary | ICD-10-CM

## 2019-07-20 DIAGNOSIS — E785 Hyperlipidemia, unspecified: Secondary | ICD-10-CM

## 2019-07-20 DIAGNOSIS — F329 Major depressive disorder, single episode, unspecified: Secondary | ICD-10-CM

## 2019-07-20 DIAGNOSIS — F32A Depression, unspecified: Secondary | ICD-10-CM

## 2019-07-20 DIAGNOSIS — K2900 Acute gastritis without bleeding: Secondary | ICD-10-CM

## 2019-07-20 LAB — GLUCOSE, CAPILLARY
Glucose-Capillary: 68 mg/dL — ABNORMAL LOW (ref 70–99)
Glucose-Capillary: 68 mg/dL — ABNORMAL LOW (ref 70–99)
Glucose-Capillary: 83 mg/dL (ref 70–99)
Glucose-Capillary: 67 mg/dL — ABNORMAL LOW (ref 70–99)
Glucose-Capillary: 76 mg/dL (ref 70–99)

## 2019-07-20 LAB — BASIC METABOLIC PANEL
Anion gap: 12 (ref 5–15)
BUN: 9 mg/dL (ref 6–20)
CO2: 23 mmol/L (ref 22–32)
Calcium: 8.2 mg/dL — ABNORMAL LOW (ref 8.9–10.3)
Chloride: 100 mmol/L (ref 98–111)
Creatinine, Ser: 0.65 mg/dL (ref 0.44–1.00)
GFR calc Af Amer: >60 mL/min (ref >60)
GFR calc non Af Amer: >60 mL/min (ref >60)
Glucose, Bld: 80 mg/dL (ref 70–99)
Potassium: 3.1 mmol/L — ABNORMAL LOW (ref 3.5–5.1)
Sodium: 135 mmol/L (ref 135–145)

## 2019-07-20 LAB — CBC
HCT: 31.2 % — ABNORMAL LOW (ref 36.0–46.0)
Hemoglobin: 10.7 g/dL — ABNORMAL LOW (ref 12.0–15.0)
MCH: 27.3 pg (ref 26.0–34.0)
MCHC: 34.3 g/dL (ref 30.0–36.0)
MCV: 79.6 fL — ABNORMAL LOW (ref 80.0–100.0)
Platelets: 127 10*3/uL — ABNORMAL LOW (ref 150–400)
RBC: 3.92 MIL/uL (ref 3.87–5.11)
RDW: 17.6 % — ABNORMAL HIGH (ref 11.5–15.5)
WBC: 3.8 10*3/uL — ABNORMAL LOW (ref 4.0–10.5)
nRBC: 0 % (ref 0.0–0.2)

## 2019-07-20 LAB — SEDIMENTATION RATE: Sed Rate: 24 mm/hr (ref 0–30)

## 2019-07-20 MED ORDER — POTASSIUM CHLORIDE 10 MEQ/100ML IV SOLN
10.0000 meq | INTRAVENOUS | Status: AC
  Administered 2019-07-20 (×2): 10 meq via INTRAVENOUS
  Filled 2019-07-20 (×2): qty 100

## 2019-07-20 MED ORDER — MAGNESIUM SULFATE 2 GM/50ML IV SOLN
2.0000 g | Freq: Once | INTRAVENOUS | Status: AC
  Administered 2019-07-20: 2 g via INTRAVENOUS
  Filled 2019-07-20: qty 50

## 2019-07-20 MED ORDER — PANTOPRAZOLE SODIUM 40 MG PO TBEC
40.0000 mg | DELAYED_RELEASE_TABLET | Freq: Two times a day (BID) | ORAL | Status: DC
  Administered 2019-07-20 – 2019-07-30 (×20): 40 mg via ORAL
  Filled 2019-07-20 (×18): qty 1

## 2019-07-20 NOTE — Progress Notes (Signed)
Patient ID: Deanna Joseph, female   DOB: [birth year], 54 y.o.   MRN: [id number] [facility] PROGRESS NOTE  ERSILIA BRAWLEY TOI:[id number] DOB: 1965-12-15 DOA: 07/17/2019 PCP: Denton Lank, MD  HPI/Subjective: Patient states she feels very weak and that staff asked to help her to the bathroom.  She was only able to eat very little for breakfast and the eggs made her sick.  She was only able to eat a few bites of soup this afternoon.  Objective: Vitals:   07/20/19 0033 07/20/19 0753  BP: 115/69 112/67  Pulse: 79 82  Resp: 20 17  Temp: 98 F (36.7 C) 98.2 F (36.8 C)  SpO2: 100% 100%    Intake/Output Summary (Last 24 hours) at 07/20/2019 1451 Last data filed at 07/20/2019 0900 Gross per 24 hour  Intake 964.76 ml  Output 0 ml  Net 964.76 ml   Filed Weights   07/17/19 1755  Weight: 54.8 kg    ROS: Review of Systems  Constitutional: Negative for chills and fever.  Eyes: Negative for blurred vision.  Respiratory: Negative for cough and shortness of breath.   Cardiovascular: Negative for chest pain.  Gastrointestinal: Negative for abdominal pain, constipation, diarrhea, nausea and vomiting.  Genitourinary: Negative for dysuria.  Musculoskeletal: Negative for joint pain.  Neurological: Negative for dizziness and headaches.   Exam: Physical Exam  Constitutional: She is oriented to person, place, and time.  HENT:  Nose: No mucosal edema.  Mouth/Throat: No oropharyngeal exudate or posterior oropharyngeal edema.  Eyes: Conjunctivae and lids are normal.  Neck: Carotid bruit is not present.  Cardiovascular: S1 normal and S2 normal. Exam reveals no gallop.  No murmur heard. Respiratory: No respiratory distress. She has no wheezes. She has no rhonchi. She has no rales.  GI: Soft. Bowel sounds are normal. There is no abdominal tenderness.  Musculoskeletal:     Right ankle: No swelling.     Left ankle: No swelling.  Lymphadenopathy:    She has no cervical adenopathy.   Neurological: She is alert and oriented to person, place, and time. No cranial nerve deficit.  Skin: Skin is warm. No rash noted. Nails show no clubbing.  Psychiatric: She has a normal mood and affect.      Data Reviewed: Basic Metabolic Panel: Recent Labs  Lab 07/17/19 1809 07/18/19 0253 07/19/19 1111 07/20/19 0636  NA 136 142 139 135  K 3.2* 3.9 3.3* 3.1*  CL 94* 108 107 100  CO2 19* 20* 17* 23  GLUCOSE 176* 86 82 80  BUN 32* 27* 15 9  CREATININE 1.18* 0.85 0.83 0.65  CALCIUM 9.7 8.3* 8.4* 8.2*  MG  --   --  1.7  --    Liver Function Tests: Recent Labs  Lab 07/17/19 1809  AST 25  ALT 22  ALKPHOS 109  BILITOT 1.9*  PROT 8.2*  ALBUMIN 4.2   Recent Labs  Lab 07/17/19 1809  LIPASE 83*   CBC: Recent Labs  Lab 07/17/19 1809 07/17/19 2104 07/18/19 0253 07/20/19 0636  WBC 8.1 8.6 6.0 3.8*  HGB 15.0 13.3 12.0 10.7*  HCT 44.5 39.5 35.2* 31.2*  MCV 80.2 81.6 80.2 79.6*  PLT 270 210 156 127*   CBG: Recent Labs  Lab 07/19/19 1642 07/19/19 2059 07/20/19 0753 07/20/19 1127 07/20/19 1243  GLUCAP 75 72 68* 68* 83    Recent Results (from the past 240 hour(s))  Respiratory Panel by RT PCR (Flu A&B, Covid) - Nasopharyngeal Swab     Status: None  Collection Time: 07/17/19  8:50 PM   Specimen: Nasopharyngeal Swab  Result Value Ref Range Status   SARS Coronavirus 2 by RT PCR NEGATIVE NEGATIVE Final    Comment: (NOTE) SARS-CoV-2 target nucleic acids are NOT DETECTED. The SARS-CoV-2 RNA is generally detectable in upper respiratoy specimens during the acute phase of infection. The lowest concentration of SARS-CoV-2 viral copies this assay can detect is 131 copies/mL. A negative result does not preclude SARS-Cov-2 infection and should not be used as the sole basis for treatment or other patient management decisions. A negative result may occur with  improper specimen collection/handling, submission of specimen other than nasopharyngeal swab, presence of viral  mutation(s) within the areas targeted by this assay, and inadequate number of viral copies (<131 copies/mL). A negative result must be combined with clinical observations, patient history, and epidemiological information. The expected result is Negative. Fact Sheet for Patients:  https://www.moore.com/ Fact Sheet for Healthcare Providers:  https://www.young.biz/ This test is not yet ap proved or cleared by the [place] FDA and  has been authorized for detection and/or diagnosis of SARS-CoV-2 by FDA under an Emergency Use Authorization (EUA). This EUA will remain  in effect (meaning this test can be used) for the duration of the COVID-19 declaration under Section 564(b)(1) of the Act, 21 U.S.C. section 360bbb-3(b)(1), unless the authorization is terminated or revoked sooner.    Influenza A by PCR NEGATIVE NEGATIVE Final   Influenza B by PCR NEGATIVE NEGATIVE Final    Comment: (NOTE) The Xpert Xpress SARS-CoV-2/FLU/RSV assay is intended as an aid in  the diagnosis of influenza from Nasopharyngeal swab specimens and  should not be used as a sole basis for treatment. Nasal washings and  aspirates are unacceptable for Xpert Xpress SARS-CoV-2/FLU/RSV  testing. Fact Sheet for Patients: https://www.moore.com/ Fact Sheet for Healthcare Providers: https://www.young.biz/ This test is not yet approved or cleared by the [place] FDA and  has been authorized for detection and/or diagnosis of SARS-CoV-2 by  FDA under an Emergency Use Authorization (EUA). This EUA will remain  in effect (meaning this test can be used) for the duration of the  Covid-19 declaration under Section 564(b)(1) of the Act, 21  U.S.C. section 360bbb-3(b)(1), unless the authorization is  terminated or revoked. Performed at [facility], [address], [place], [place] [address]      Studies: US Abdomen Limited  RUQ  Result Date: 07/20/2019 CLINICAL DATA:  Inpatient. History of cholecystectomy, nausea and vomiting and right upper quadrant abdominal pain. Weight loss over 2 months. EXAM: ULTRASOUND ABDOMEN LIMITED RIGHT UPPER QUADRANT COMPARISON:  07/17/2019 CT abdomen/pelvis. FINDINGS: Gallbladder: No gallstones or wall thickening visualized. No sonographic Murphy sign noted by sonographer. Common bile duct: Diameter: 3 mm Liver: No focal lesion identified. Within normal limits in parenchymal echogenicity. Portal vein is patent on color Doppler imaging with normal direction of blood flow towards the liver. Other: None. IMPRESSION: Normal post cholecystectomy right upper quadrant abdominal sonogram. No biliary ductal dilatation. Electronically Signed   By: Delbert Phenix M.D.   On: 07/20/2019 10:35    Scheduled Meds: . atorvastatin  20 mg Oral q1800  . enoxaparin (LOVENOX) injection  40 mg Subcutaneous Q24H  . metoCLOPramide (REGLAN) injection  5 mg Intravenous TID AC & HS  . pantoprazole  40 mg Oral BID  . PARoxetine  20 mg Oral Daily  . psyllium  1 packet Oral Daily   Continuous Infusions:  Assessment/Plan:  1. Gastritis.  Patient on Protonix.  Patient also  on Reglan.  Patient not eating much for breakfast or lunch.  Nervous about sending her out today secondary to not eating much. 2. Weakness.  Patient states that she needs help even to get to the bathroom.  I will get a physical therapy evaluation 3. Hyperlipidemia unspecified on atorvastatin 4. Depression on Paxil 5. Hypokalemia replace potassium and IV.  Since magnesium also low yesterday I will give magnesium replacement also 6. Type 2 diabetes mellitus with hyperlipidemia on diet control and Lipitor.  Hemoglobin A1c 6.5.  All sugars have been good.  Code Status:     Code Status Orders  (From admission, onward)         Start     Ordered   07/17/19 2047  Full code  Continuous     07/17/19 2058        Code Status History    This  patient has a current code status but no historical code status.   Advance Care Planning Activity     Family Communication: Left message for husband Disposition Plan: At this point evaluate daily for discharge home.  Since patient did not eat much today she is at high risk for becoming dehydrated and coming back to the hospital.  Like her to eat a few meals here.  Reevaluate tomorrow morning for discharge home.  Consultants:  gastroenterology  Time spent: 28 minutes  [employer]

## 2019-07-21 ENCOUNTER — Inpatient Hospital Stay: Payer: Self-pay

## 2019-07-21 DIAGNOSIS — R110 Nausea: Secondary | ICD-10-CM

## 2019-07-21 DIAGNOSIS — H02402 Unspecified ptosis of left eyelid: Secondary | ICD-10-CM

## 2019-07-21 LAB — CBC
HCT: 32.4 % — ABNORMAL LOW (ref 36.0–46.0)
Hemoglobin: 11.2 g/dL — ABNORMAL LOW (ref 12.0–15.0)
MCH: 27.3 pg (ref 26.0–34.0)
MCHC: 34.6 g/dL (ref 30.0–36.0)
MCV: 79 fL — ABNORMAL LOW (ref 80.0–100.0)
Platelets: 141 10*3/uL — ABNORMAL LOW (ref 150–400)
RBC: 4.1 MIL/uL (ref 3.87–5.11)
RDW: 17.5 % — ABNORMAL HIGH (ref 11.5–15.5)
WBC: 3.3 10*3/uL — ABNORMAL LOW (ref 4.0–10.5)
nRBC: 0 % (ref 0.0–0.2)

## 2019-07-21 LAB — BASIC METABOLIC PANEL
Anion gap: 14 (ref 5–15)
BUN: 9 mg/dL (ref 6–20)
CO2: 21 mmol/L — ABNORMAL LOW (ref 22–32)
Calcium: 8.2 mg/dL — ABNORMAL LOW (ref 8.9–10.3)
Chloride: 100 mmol/L (ref 98–111)
Creatinine, Ser: 0.68 mg/dL (ref 0.44–1.00)
GFR calc Af Amer: >60 mL/min (ref >60)
GFR calc non Af Amer: >60 mL/min (ref >60)
Glucose, Bld: 76 mg/dL (ref 70–99)
Potassium: 3.1 mmol/L — ABNORMAL LOW (ref 3.5–5.1)
Sodium: 135 mmol/L (ref 135–145)

## 2019-07-21 LAB — GLUCOSE, CAPILLARY
Glucose-Capillary: 77 mg/dL (ref 70–99)
Glucose-Capillary: 93 mg/dL (ref 70–99)
Glucose-Capillary: 68 mg/dL — ABNORMAL LOW (ref 70–99)
Glucose-Capillary: 75 mg/dL (ref 70–99)

## 2019-07-21 MED ORDER — POTASSIUM CHLORIDE 20 MEQ PO PACK
40.0000 meq | PACK | Freq: Once | ORAL | Status: AC
  Administered 2019-07-21: 40 meq via ORAL
  Filled 2019-07-21: qty 2

## 2019-07-21 MED ORDER — LORAZEPAM 2 MG/ML IJ SOLN
1.0000 mg | Freq: Once | INTRAMUSCULAR | Status: AC | PRN
  Administered 2019-07-21: 16:00:00 1 mg via INTRAVENOUS
  Filled 2019-07-21: qty 1

## 2019-07-21 MED ORDER — GADOBUTROL 1 MMOL/ML IV SOLN
6.0000 mL | Freq: Once | INTRAVENOUS | Status: AC | PRN
  Administered 2019-07-21: 6 mL via INTRAVENOUS

## 2019-07-21 NOTE — Progress Notes (Signed)
Patient ID: Deanna Joseph, female   DOB: [birth year], 54 y.o.   MRN: [id number] [facility] PROGRESS NOTE  Deanna Joseph JME:[id number] DOB: 12-16-1965 DOA: 07/17/2019 PCP: Hillery Aldo, MD  HPI/Subjective: Patient seen this morning after what I thought would have been where she would have eaten breakfast.  She did not eat any breakfast.  Her potassium was low because she has not been eating.  Only had a few bites for dinner last night.  I told her to call the dietary and send up what she would like.  Objective: Vitals:   07/20/19 2343 07/21/19 0805  BP: 118/70 110/65  Pulse: 82 81  Resp: 16   Temp: 97.8 F (36.6 C) 98.2 F (36.8 C)  SpO2: 100% 100%    Intake/Output Summary (Last 24 hours) at 07/21/2019 1439 Last data filed at 07/20/2019 1500 Gross per 24 hour  Intake 200 ml  Output --  Net 200 ml   Filed Weights   07/17/19 1755  Weight: 54.8 kg    ROS: Review of Systems  Constitutional: Negative for chills and fever.  Eyes: Negative for blurred vision.  Respiratory: Negative for cough and shortness of breath.   Cardiovascular: Negative for chest pain.  Gastrointestinal: Positive for abdominal pain and nausea. Negative for constipation, diarrhea and vomiting.  Genitourinary: Negative for dysuria.  Musculoskeletal: Negative for joint pain.  Neurological: Negative for dizziness and headaches.   Exam: Physical Exam  Constitutional: She is oriented to person, place, and time.  HENT:  Nose: No mucosal edema.  Mouth/Throat: No oropharyngeal exudate or posterior oropharyngeal edema.  Eyes: Conjunctivae are normal.  Left eye ptosis and light bothers her only.  Neck: Carotid bruit is not present.  Cardiovascular: S1 normal and S2 normal. Exam reveals no gallop.  No murmur heard. Respiratory: No respiratory distress. She has no wheezes. She has no rhonchi. She has no rales.  GI: Soft. Bowel sounds are normal. There is no abdominal tenderness.  Musculoskeletal:      Right ankle: No swelling.     Left ankle: No swelling.  Lymphadenopathy:    She has no cervical adenopathy.  Neurological: She is alert and oriented to person, place, and time. No cranial nerve deficit.  Skin: Skin is warm. No rash noted. Nails show no clubbing.  Psychiatric: She has a normal mood and affect.      Data Reviewed: Basic Metabolic Panel: Recent Labs  Lab 07/17/19 1809 07/18/19 0253 07/19/19 1111 07/20/19 0636 07/21/19 0434  NA 136 142 139 135 135  K 3.2* 3.9 3.3* 3.1* 3.1*  CL 94* 108 107 100 100  CO2 19* 20* 17* 23 21*  GLUCOSE 176* 86 82 80 76  BUN 32* 27* 15 9 9   CREATININE 1.18* 0.85 0.83 0.65 0.68  CALCIUM 9.7 8.3* 8.4* 8.2* 8.2*  MG  --   --  1.7  --   --    Liver Function Tests: Recent Labs  Lab 07/17/19 1809  AST 25  ALT 22  ALKPHOS 109  BILITOT 1.9*  PROT 8.2*  ALBUMIN 4.2   Recent Labs  Lab 07/17/19 1809  LIPASE 83*   CBC: Recent Labs  Lab 07/17/19 1809 07/17/19 2104 07/18/19 0253 07/20/19 0636 07/21/19 0434  WBC 8.1 8.6 6.0 3.8* 3.3*  HGB 15.0 13.3 12.0 10.7* 11.2*  HCT 44.5 39.5 35.2* 31.2* 32.4*  MCV 80.2 81.6 80.2 79.6* 79.0*  PLT 270 210 156 127* 141*   CBG: Recent Labs  Lab 07/20/19 1243 07/20/19  1628 07/20/19 2023 07/21/19 0805 07/21/19 1135  GLUCAP 83 67* 76 77 93    Recent Results (from the past 240 hour(s))  Respiratory Panel by RT PCR (Flu A&B, Covid) - Nasopharyngeal Swab     Status: None   Collection Time: 07/17/19  8:50 PM   Specimen: Nasopharyngeal Swab  Result Value Ref Range Status   SARS Coronavirus 2 by RT PCR NEGATIVE NEGATIVE Final    Comment: (NOTE) SARS-CoV-2 target nucleic acids are NOT DETECTED. The SARS-CoV-2 RNA is generally detectable in upper respiratoy specimens during the acute phase of infection. The lowest concentration of SARS-CoV-2 viral copies this assay can detect is 131 copies/mL. A negative result does not preclude SARS-Cov-2 infection and should not be used as the sole  basis for treatment or other patient management decisions. A negative result may occur with  improper specimen collection/handling, submission of specimen other than nasopharyngeal swab, presence of viral mutation(s) within the areas targeted by this assay, and inadequate number of viral copies (<131 copies/mL). A negative result must be combined with clinical observations, patient history, and epidemiological information. The expected result is Negative. Fact Sheet for Patients:  PinkCheek.be Fact Sheet for Healthcare Providers:  GravelBags.it This test is not yet ap proved or cleared by the [place] FDA and  has been authorized for detection and/or diagnosis of SARS-CoV-2 by FDA under an Emergency Use Authorization (EUA). This EUA will remain  in effect (meaning this test can be used) for the duration of the COVID-19 declaration under Section 564(b)(1) of the Act, 21 U.S.C. section 360bbb-3(b)(1), unless the authorization is terminated or revoked sooner.    Influenza A by PCR NEGATIVE NEGATIVE Final   Influenza B by PCR NEGATIVE NEGATIVE Final    Comment: (NOTE) The Xpert Xpress SARS-CoV-2/FLU/RSV assay is intended as an aid in  the diagnosis of influenza from Nasopharyngeal swab specimens and  should not be used as a sole basis for treatment. Nasal washings and  aspirates are unacceptable for Xpert Xpress SARS-CoV-2/FLU/RSV  testing. Fact Sheet for Patients: PinkCheek.be Fact Sheet for Healthcare Providers: GravelBags.it This test is not yet approved or cleared by the [place] FDA and  has been authorized for detection and/or diagnosis of SARS-CoV-2 by  FDA under an Emergency Use Authorization (EUA). This EUA will remain  in effect (meaning this test can be used) for the duration of the  Covid-19 declaration under Section 564(b)(1) of the Act, 21  U.S.C.  section 360bbb-3(b)(1), unless the authorization is  terminated or revoked. Performed at [facility], [address], [place], [place] [address]      Studies: US Abdomen Limited RUQ  Result Date: 07/20/2019 CLINICAL DATA:  Inpatient. History of cholecystectomy, nausea and vomiting and right upper quadrant abdominal pain. Weight loss over 2 months. EXAM: ULTRASOUND ABDOMEN LIMITED RIGHT UPPER QUADRANT COMPARISON:  07/17/2019 CT abdomen/pelvis. FINDINGS: Gallbladder: No gallstones or wall thickening visualized. No sonographic Murphy sign noted by sonographer. Common bile duct: Diameter: 3 mm Liver: No focal lesion identified. Within normal limits in parenchymal echogenicity. Portal vein is patent on color Doppler imaging with normal direction of blood flow towards the liver. Other: None. IMPRESSION: Normal post cholecystectomy right upper quadrant abdominal sonogram. No biliary ductal dilatation. Electronically Signed   By: Ilona Sorrel M.D.   On: 07/20/2019 10:35    Scheduled Meds: . atorvastatin  20 mg Oral q1800  . enoxaparin (LOVENOX) injection  40 mg Subcutaneous Q24H  . metoCLOPramide (REGLAN) injection  5 mg Intravenous  TID AC & HS  . pantoprazole  40 mg Oral BID  . PARoxetine  20 mg Oral Daily  . psyllium  1 packet Oral Daily   Continuous Infusions:  Assessment/Plan:  1. Persistent nausea, gastritis.  We will get an MRI of the brain to rule out central causes.  Patient on Protonix.  Patient also on Reglan.  Patient did not eat for breakfast.  Nervous about sending her out on the hospital until she eats. 2. Weakness.  Patient states that she needs help even to get to the bathroom.  Physical therapy recommended home with home health. 3. Hyperlipidemia unspecified on atorvastatin 4. Depression on Paxil 5. Hypokalemia replace potassium and KLor. 6. Type 2 diabetes mellitus with hyperlipidemia on diet control and Lipitor.  Hemoglobin A1c 6.5.  All sugars have been  good. 7. Ptosis left eyelid.  Patient will need to follow-up with ophthalmology as outpatient.  Code Status:     Code Status Orders  (From admission, onward)         Start     Ordered   07/17/19 2047  Full code  Continuous     07/17/19 2058        Code Status History    This patient has a current code status but no historical code status.   Advance Care Planning Activity     Family Communication: Spoke with husband yesterday. Disposition Plan: At this point evaluate daily for discharge home.  Ordered an MRI of the brain to rule out central causes of patient with nausea and not eating.  Patient also had left-sided ptosis.  Once the MRI report is received we can review with patient and see how much he ate on whether to go home today or tomorrow.  Consultants:  gastroenterology  Time spent: 28 minutes  [employer]

## 2019-07-21 NOTE — Evaluation (Signed)
Physical Therapy Evaluation Patient Details Name: Deanna Joseph MRN: [id number] DOB: [birth year] Today's Date: 07/21/2019   History of Present Illness  54 yo female with onset of N&V throughout Jan was admitted for testing.  Currently wiht gastritis, low K+ and general weakness.  PMHx:  gastritis, DM, HTN, AKI  Clinical Impression  Pt is up to side of bed with PT and will make an attempt at gait after her lunch possibly with nursing.  Her plan is to follow acutely and have home therapy follow up for LE weakness but otherwise has the mobility equipment needed.  Follow up also with her for tolerance on stairs when ready to make sure she is comfortably able to get home with family assist.  Pt is likely able to move with S if not nauseated using a RW but will need to confirm this.    Follow Up Recommendations Home health PT    Equipment Recommendations  None recommended by PT    Recommendations for Other Services       Precautions / Restrictions Precautions Precautions: Fall Precaution Comments: limited mobility tolerance due to N&V Restrictions Weight Bearing Restrictions: No      Mobility  Bed Mobility Overal bed mobility: Needs Assistance Bed Mobility: Sidelying to Sit   Sidelying to sit: Min assist       General bed mobility comments: min to power up with side to sit trunk support  Transfers                 General transfer comment: declined based on nausea  Ambulation/Gait             General Gait Details: declined based on nausea  Stairs            Wheelchair Mobility    Modified Rankin (Stroke Patients Only)       Balance Overall balance assessment: Needs assistance Sitting-balance support: Feet supported Sitting balance-Leahy Scale: Fair                                       Pertinent Vitals/Pain Pain Assessment: No/denies pain    Home Living Family/patient expects to be discharged to:: Private residence Living  Arrangements: Spouse/significant other Available Help at Discharge: Family;Available 24 hours/day Type of Home: House Home Access: Stairs to enter Entrance Stairs-Rails: Psychiatric nurse of Steps: 2 Home Layout: One level Home Equipment: Walker - 2 wheels;Walker - 4 wheels Additional Comments: has been able to walk with no AD until recently with the sympptoms requiring HHA    Prior Function Level of Independence: Independent         Comments: recent HHA from significant other     Hand Dominance   Dominant Hand: Right    Extremity/Trunk Assessment   Upper Extremity Assessment Upper Extremity Assessment: Overall WFL for tasks assessed    Lower Extremity Assessment Lower Extremity Assessment: Generalized weakness    Cervical / Trunk Assessment Cervical / Trunk Assessment: Normal  Communication   Communication: No difficulties  Cognition Arousal/Alertness: Awake/alert Behavior During Therapy: WFL for tasks assessed/performed Overall Cognitive Status: Within Functional Limits for tasks assessed                                 General Comments: able to give a good history      General Comments General comments (  skin integrity, edema, etc.): pt was up to side of bed with help and noted LE weakness on mm testing but cannot stand over nausea and not because she cannot perform the work    Exercises     Assessment/Plan    PT Assessment Patient needs continued PT services  PT Problem List Decreased strength;Decreased range of motion;Decreased activity tolerance;Decreased balance;Decreased mobility;Decreased knowledge of use of DME       PT Treatment Interventions DME instruction;Gait training;Stair training;Functional mobility training;Therapeutic activities;Therapeutic exercise;Neuromuscular re-education;Balance training;Patient/family education    PT Goals (Current goals can be found in the Care Plan section)  Acute Rehab PT  Goals Patient Stated Goal: to get stronger and control stomach symptoms PT Goal Formulation: With patient Time For Goal Achievement: 07/28/19 Potential to Achieve Goals: Good    Frequency Min 2X/week   Barriers to discharge Inaccessible home environment has family support to get home    Co-evaluation               AM-PAC PT "6 Clicks" Mobility  Outcome Measure Help needed turning from your back to your side while in a flat bed without using bedrails?: None Help needed moving from lying on your back to sitting on the side of a flat bed without using bedrails?: A Little Help needed moving to and from a bed to a chair (including a wheelchair)?: A Little Help needed standing up from a chair using your arms (e.g., wheelchair or bedside chair)?: A Little Help needed to walk in hospital room?: A Little Help needed climbing 3-5 steps with a railing? : A Little 6 Click Score: 19    End of Session Equipment Utilized During Treatment: Gait belt Activity Tolerance: Patient limited by fatigue;Treatment limited secondary to medical complications (Comment) Patient left: in bed;with call bell/phone within reach Nurse Communication: Mobility status PT Visit Diagnosis: Unsteadiness on feet (R26.81);Muscle weakness (generalized) (M62.81)    Time: 1040-1101 PT Time Calculation (min) (ACUTE ONLY): 21 min   Charges:   PT Evaluation $PT Eval Moderate Complexity: 1 Mod         Ivar Drape 07/21/2019, 11:56 AM   Samul Dada, PT MS Acute Rehab Dept. Number: [facility] [id number] and [facility] ([facility]) [phone]

## 2019-07-22 LAB — BASIC METABOLIC PANEL
Anion gap: 16 — ABNORMAL HIGH (ref 5–15)
BUN: 11 mg/dL (ref 6–20)
CO2: 20 mmol/L — ABNORMAL LOW (ref 22–32)
Calcium: 8.3 mg/dL — ABNORMAL LOW (ref 8.9–10.3)
Chloride: 100 mmol/L (ref 98–111)
Creatinine, Ser: 0.68 mg/dL (ref 0.44–1.00)
GFR calc Af Amer: >60 mL/min (ref >60)
GFR calc non Af Amer: >60 mL/min (ref >60)
Glucose, Bld: 77 mg/dL (ref 70–99)
Potassium: 3.2 mmol/L — ABNORMAL LOW (ref 3.5–5.1)
Sodium: 136 mmol/L (ref 135–145)

## 2019-07-22 LAB — CBC
HCT: 33.4 % — ABNORMAL LOW (ref 36.0–46.0)
Hemoglobin: 11.6 g/dL — ABNORMAL LOW (ref 12.0–15.0)
MCH: 27.3 pg (ref 26.0–34.0)
MCHC: 34.7 g/dL (ref 30.0–36.0)
MCV: 78.6 fL — ABNORMAL LOW (ref 80.0–100.0)
Platelets: 124 10*3/uL — ABNORMAL LOW (ref 150–400)
RBC: 4.25 MIL/uL (ref 3.87–5.11)
RDW: 17.4 % — ABNORMAL HIGH (ref 11.5–15.5)
WBC: 3.6 10*3/uL — ABNORMAL LOW (ref 4.0–10.5)
nRBC: 0 % (ref 0.0–0.2)

## 2019-07-22 LAB — GLUCOSE, CAPILLARY
Glucose-Capillary: 74 mg/dL (ref 70–99)
Glucose-Capillary: 79 mg/dL (ref 70–99)
Glucose-Capillary: 87 mg/dL (ref 70–99)
Glucose-Capillary: 73 mg/dL (ref 70–99)
Glucose-Capillary: 71 mg/dL (ref 70–99)

## 2019-07-22 LAB — MAGNESIUM: Magnesium: 1.8 mg/dL (ref 1.7–2.4)

## 2019-07-22 MED ORDER — METOCLOPRAMIDE HCL 5 MG/ML IJ SOLN
10.0000 mg | Freq: Three times a day (TID) | INTRAMUSCULAR | Status: DC
  Administered 2019-07-22 (×2): 10 mg via INTRAVENOUS
  Filled 2019-07-22 (×2): qty 2

## 2019-07-22 MED ORDER — MAGNESIUM SULFATE 2 GM/50ML IV SOLN
2.0000 g | Freq: Once | INTRAVENOUS | Status: AC
  Administered 2019-07-22: 2 g via INTRAVENOUS
  Filled 2019-07-22: qty 50

## 2019-07-22 MED ORDER — POTASSIUM CHLORIDE CRYS ER 20 MEQ PO TBCR
40.0000 meq | EXTENDED_RELEASE_TABLET | ORAL | Status: AC
  Administered 2019-07-22 (×2): 40 meq via ORAL
  Filled 2019-07-22 (×2): qty 2

## 2019-07-22 MED ORDER — SENNOSIDES-DOCUSATE SODIUM 8.6-50 MG PO TABS
1.0000 | ORAL_TABLET | Freq: Two times a day (BID) | ORAL | Status: DC
  Administered 2019-07-22 – 2019-07-29 (×14): 1 via ORAL
  Filled 2019-07-22 (×14): qty 1

## 2019-07-22 NOTE — Progress Notes (Signed)
Patient ID: Deanna Joseph, female   DOB: August 21, 1965, 54 y.o.   MRN: [id number] [facility] PROGRESS NOTE  Deanna Joseph GGY:[id number] DOB: [birth year] DOA: 07/17/2019 PCP: Hillery Aldo, MD  HPI/Subjective: Has difficulty swallowing.  No nausea no vomiting.  No fever no chills.  No BM.  Objective: Vitals:   07/22/19 0803 07/22/19 1539  BP: 124/76 108/73  Pulse: 88 93  Resp:  18  Temp: 98.4 F (36.9 C) 98.2 F (36.8 C)  SpO2: 99% 100%    Intake/Output Summary (Last 24 hours) at 07/22/2019 1913 Last data filed at 07/22/2019 1700 Gross per 24 hour  Intake 646.88 ml  Output --  Net 646.88 ml   Filed Weights   07/17/19 1755  Weight: 54.8 kg    ROS: Review of Systems  Constitutional: Negative for chills and fever.  Eyes: Negative for blurred vision.  Respiratory: Negative for cough and shortness of breath.   Cardiovascular: Negative for chest pain.  Gastrointestinal: Positive for abdominal pain and nausea. Negative for constipation, diarrhea and vomiting.  Genitourinary: Negative for dysuria.  Musculoskeletal: Negative for joint pain.  Neurological: Negative for dizziness and headaches.   Exam: Physical Exam  Constitutional: She is oriented to person, place, and time.  HENT:  Nose: No mucosal edema.  Mouth/Throat: No oropharyngeal exudate or posterior oropharyngeal edema.  Eyes: Conjunctivae are normal.  Left eye ptosis and light bothers her only.  Neck: Carotid bruit is not present.  Cardiovascular: S1 normal and S2 normal. Exam reveals no gallop.  No murmur heard. Respiratory: No respiratory distress. She has no wheezes. She has no rhonchi. She has no rales.  GI: Soft. Bowel sounds are normal. There is no abdominal tenderness.  Musculoskeletal:     Right ankle: No swelling.     Left ankle: No swelling.  Lymphadenopathy:    She has no cervical adenopathy.  Neurological: She is alert and oriented to person, place, and time. No cranial nerve deficit.  Skin:  Skin is warm. No rash noted. Nails show no clubbing.  Psychiatric: She has a normal mood and affect.      Data Reviewed: Basic Metabolic Panel: Recent Labs  Lab 07/18/19 0253 07/19/19 1111 07/20/19 0636 07/21/19 0434 07/22/19 0428  NA 142 139 135 135 136  K 3.9 3.3* 3.1* 3.1* 3.2*  CL 108 107 100 100 100  CO2 20* 17* 23 21* 20*  GLUCOSE 86 82 80 76 77  BUN 27* 15 9 9 11   CREATININE 0.85 0.83 0.65 0.68 0.68  CALCIUM 8.3* 8.4* 8.2* 8.2* 8.3*  MG  --  1.7  --   --  1.8   Liver Function Tests: Recent Labs  Lab 07/17/19 1809  AST 25  ALT 22  ALKPHOS 109  BILITOT 1.9*  PROT 8.2*  ALBUMIN 4.2   Recent Labs  Lab 07/17/19 1809  LIPASE 83*   CBC: Recent Labs  Lab 07/17/19 2104 07/18/19 0253 07/20/19 0636 07/21/19 0434 07/22/19 0428  WBC 8.6 6.0 3.8* 3.3* 3.6*  HGB 13.3 12.0 10.7* 11.2* 11.6*  HCT 39.5 35.2* 31.2* 32.4* 33.4*  MCV 81.6 80.2 79.6* 79.0* 78.6*  PLT 210 156 127* 141* 124*   CBG: Recent Labs  Lab 07/21/19 2036 07/21/19 2239 07/22/19 0801 07/22/19 1151 07/22/19 1659  GLUCAP 68* 75 79 87 73    Recent Results (from the past 240 hour(s))  Respiratory Panel by RT PCR (Flu A&B, Covid) - Nasopharyngeal Swab     Status: None   Collection Time:  07/17/19  8:50 PM   Specimen: Nasopharyngeal Swab  Result Value Ref Range Status   SARS Coronavirus 2 by RT PCR NEGATIVE NEGATIVE Final    Comment: (NOTE) SARS-CoV-2 target nucleic acids are NOT DETECTED. The SARS-CoV-2 RNA is generally detectable in upper respiratoy specimens during the acute phase of infection. The lowest concentration of SARS-CoV-2 viral copies this assay can detect is 131 copies/mL. A negative result does not preclude SARS-Cov-2 infection and should not be used as the sole basis for treatment or other patient management decisions. A negative result may occur with  improper specimen collection/handling, submission of specimen other than nasopharyngeal swab, presence of viral  mutation(s) within the areas targeted by this assay, and inadequate number of viral copies (<131 copies/mL). A negative result must be combined with clinical observations, patient history, and epidemiological information. The expected result is Negative. Fact Sheet for Patients:  PinkCheek.be Fact Sheet for Healthcare Providers:  GravelBags.it This test is not yet ap proved or cleared by the [place] FDA and  has been authorized for detection and/or diagnosis of SARS-CoV-2 by FDA under an Emergency Use Authorization (EUA). This EUA will remain  in effect (meaning this test can be used) for the duration of the COVID-19 declaration under Section 564(b)(1) of the Act, 21 U.S.C. section 360bbb-3(b)(1), unless the authorization is terminated or revoked sooner.    Influenza A by PCR NEGATIVE NEGATIVE Final   Influenza B by PCR NEGATIVE NEGATIVE Final    Comment: (NOTE) The Xpert Xpress SARS-CoV-2/FLU/RSV assay is intended as an aid in  the diagnosis of influenza from Nasopharyngeal swab specimens and  should not be used as a sole basis for treatment. Nasal washings and  aspirates are unacceptable for Xpert Xpress SARS-CoV-2/FLU/RSV  testing. Fact Sheet for Patients: PinkCheek.be Fact Sheet for Healthcare Providers: GravelBags.it This test is not yet approved or cleared by the [place] FDA and  has been authorized for detection and/or diagnosis of SARS-CoV-2 by  FDA under an Emergency Use Authorization (EUA). This EUA will remain  in effect (meaning this test can be used) for the duration of the  Covid-19 declaration under Section 564(b)(1) of the Act, 21  U.S.C. section 360bbb-3(b)(1), unless the authorization is  terminated or revoked. Performed at [facility], [address] [place], [place] [address]      Studies: MR BRAIN W WO  CONTRAST  Result Date: 07/21/2019 CLINICAL DATA:  Nausea/vomiting. Additional history provided: Persistent nausea/gastritis, rule out central causes, weakness, hyperlipidemia, hypokalemia, type 2 diabetes, ptosis left eyelid EXAM: MRI HEAD WITHOUT AND WITH CONTRAST TECHNIQUE: Multiplanar, multiecho pulse sequences of the brain and surrounding structures were obtained without and with intravenous contrast. CONTRAST:  57mL GADAVIST GADOBUTROL 1 MMOL/ML IV SOLN COMPARISON:  Brain MRI 12/20/2006 FINDINGS: Brain: The examination is intermittently motion degraded. Most notably there is moderate motion degradation of the axial T2 FLAIR sequence and moderate/severe motion degradation of the coronal T1 weighted postcontrast sequence. There is no evidence of acute infarct. No evidence of intracranial mass. No midline shift or extra-axial fluid collection. No chronic intracranial blood products. Mild scattered and ill-defined T2/FLAIR hyperintensity within the cerebral white matter and pons has progressed from prior examination 12/20/2006. Findings are nonspecific, but may reflect chronic small vessel ischemic disease given known history of diabetes mellitus. Cerebral volume is normal for age. No abnormal intracranial enhancement. Vascular: Flow voids maintained within the proximal large arterial vessels. Skull and upper cervical spine: Expected enhancement within the proximal large arterial vessels and  dural venous sinuses. Sinuses/Orbits: Visualized orbits demonstrate no acute abnormality. Mild ethmoid sinus mucosal thickening. No significant mastoid effusion. IMPRESSION: Motion degraded examination. Most notably, there is moderate/severe motion degradation of the coronal T1 weighted postcontrast imaging. Within this limitation, there is no evidence of acute intracranial abnormality. Mild (but advanced for age) scattered and ill-defined T2 hyperintensity within the cerebral white matter and pons has progressed since MRI  12/20/2006. Findings are nonspecific but may reflect sequela of chronic small vessel ischemic disease given known history of diabetes mellitus. Electronically Signed   By: Jackey Loge DO   On: 07/21/2019 17:06    Scheduled Meds: . atorvastatin  20 mg Oral q1800  . enoxaparin (LOVENOX) injection  40 mg Subcutaneous Q24H  . metoCLOPramide (REGLAN) injection  10 mg Intravenous TID AC & HS  . pantoprazole  40 mg Oral BID  . PARoxetine  20 mg Oral Daily  . psyllium  1 packet Oral Daily  . senna-docusate  1 tablet Oral BID   Continuous Infusions:  Assessment/Plan:  1. Persistent nausea, gastritis.  We will get an MRI of the brain to rule out central causes.  Patient on Protonix.  Patient also on Reglan.  Patient did not eat for breakfast.  MRI brain negative for any acute abnormality. Will increase Reglan and bowel regimen and monitor response.  If not will require GI reevaluation. 2. Weakness.  Patient states that she needs help even to get to the bathroom.  Physical therapy recommended home with home health. 3. Hyperlipidemia unspecified on atorvastatin 4. Depression on Paxil 5. Hypokalemia replace potassium and KLor. 6. Type 2 diabetes mellitus with hyperlipidemia on diet control and Lipitor.  Hemoglobin A1c 6.5.  All sugars have been good. 7. Ptosis left eyelid.  Patient will need to follow-up with ophthalmology as outpatient.  Code Status:     Code Status Orders  (From admission, onward)         Start     Ordered   07/17/19 2047  Full code  Continuous     07/17/19 2058        Code Status History    This patient has a current code status but no historical code status.   Advance Care Planning Activity     Family Communication: Spoke with husband yesterday. Disposition Plan: Still not able to swallow anything.  Will monitor until she is able to swallow.  May require evaluation by GI.  Consultants:  gastroenterology  Time spent: 28 minutes  Lynden Oxford  [facility]

## 2019-07-23 ENCOUNTER — Inpatient Hospital Stay: Payer: Self-pay

## 2019-07-23 LAB — CBC
HCT: 34.2 % — ABNORMAL LOW (ref 36.0–46.0)
Hemoglobin: 11.6 g/dL — ABNORMAL LOW (ref 12.0–15.0)
MCH: 27.2 pg (ref 26.0–34.0)
MCHC: 33.9 g/dL (ref 30.0–36.0)
MCV: 80.3 fL (ref 80.0–100.0)
Platelets: 127 10*3/uL — ABNORMAL LOW (ref 150–400)
RBC: 4.26 MIL/uL (ref 3.87–5.11)
RDW: 17.7 % — ABNORMAL HIGH (ref 11.5–15.5)
WBC: 3.8 10*3/uL — ABNORMAL LOW (ref 4.0–10.5)
nRBC: 0.5 % — ABNORMAL HIGH (ref 0.0–0.2)

## 2019-07-23 LAB — BASIC METABOLIC PANEL WITH GFR
Anion gap: 11 (ref 5–15)
BUN: 15 mg/dL (ref 6–20)
CO2: 20 mmol/L — ABNORMAL LOW (ref 22–32)
Calcium: 8.4 mg/dL — ABNORMAL LOW (ref 8.9–10.3)
Chloride: 104 mmol/L (ref 98–111)
Creatinine, Ser: 0.77 mg/dL (ref 0.44–1.00)
GFR calc Af Amer: >60 mL/min
GFR calc non Af Amer: >60 mL/min
Glucose, Bld: 83 mg/dL (ref 70–99)
Potassium: 4.8 mmol/L (ref 3.5–5.1)
Sodium: 135 mmol/L (ref 135–145)

## 2019-07-23 LAB — GLUCOSE, CAPILLARY
Glucose-Capillary: 74 mg/dL (ref 70–99)
Glucose-Capillary: 79 mg/dL (ref 70–99)
Glucose-Capillary: 92 mg/dL (ref 70–99)
Glucose-Capillary: 82 mg/dL (ref 70–99)
Glucose-Capillary: 89 mg/dL (ref 70–99)

## 2019-07-23 LAB — SURGICAL PATHOLOGY

## 2019-07-23 MED ORDER — POLYETHYLENE GLYCOL 3350 17 G PO PACK
17.0000 g | PACK | Freq: Every day | ORAL | Status: DC
  Administered 2019-07-23 – 2019-07-25 (×2): 17 g via ORAL
  Filled 2019-07-23 (×3): qty 1

## 2019-07-23 MED ORDER — MAGNESIUM CITRATE PO SOLN
1.0000 | Freq: Once | ORAL | Status: AC
  Administered 2019-07-23: 1 via ORAL
  Filled 2019-07-23: qty 296

## 2019-07-23 MED ORDER — METOCLOPRAMIDE HCL 10 MG PO TABS
10.0000 mg | ORAL_TABLET | Freq: Three times a day (TID) | ORAL | Status: DC
  Administered 2019-07-23 – 2019-07-30 (×22): 10 mg via ORAL
  Filled 2019-07-23 (×23): qty 1

## 2019-07-23 MED ORDER — MIRTAZAPINE 15 MG PO TABS
7.5000 mg | ORAL_TABLET | Freq: Every day | ORAL | Status: DC
  Administered 2019-07-23 – 2019-07-29 (×6): 7.5 mg via ORAL
  Filled 2019-07-23 (×5): qty 1

## 2019-07-23 MED ORDER — ENSURE ENLIVE PO LIQD
237.0000 mL | Freq: Three times a day (TID) | ORAL | Status: DC
  Administered 2019-07-23 – 2019-07-30 (×19): 237 mL via ORAL

## 2019-07-23 NOTE — Progress Notes (Signed)
OT Cancellation Note  Patient Details Name: Deanna Joseph MRN: [id number] DOB: December 29, 1965   Cancelled Treatment:    Reason Eval/Treat Not Completed: Pain limiting ability to participate. Consult received, chart reviewed. Upon attempt, pt laying in bed, holding her stomach and grimacing. Spouse present. Pt reports 8/10 abdominal/stomach pain. Per pt's request, RN notified to address. Will re-attempt OT evaluation next date as pt is medically appropriate and able to participate.   Richrd Prime, MPH, MS, OTR/L ascom [phone] 07/23/19, 3:10 PM

## 2019-07-23 NOTE — Progress Notes (Signed)
Physical Therapy Treatment Patient Details Name: Deanna Joseph MRN: [id number] DOB: Feb 20, 1966 Today's Date: 07/23/2019    History of Present Illness 54 yo female with onset of N&V throughout Jan was admitted for testing.  Currently with gastritis, low K+ and general weakness.  PMHx:  gastritis, DM, HTN, AKI    PT Comments    Pt resting in bed upon PT arrival and agreeable to PT session.  Pt requiring min assist to sit onto edge of bed; CGA to stand from bed up to walker; and CGA to min assist to walk short distance bed to recliner with walker (steady going forward and turning but min assist to steady when taking a few steps backwards).  Pt requiring extra time to perform all mobility d/t nausea and needing to take extended rest breaks d/t nausea (no emesis during session); cold wash cloth placed on pt's forehead end of session and pt reporting this made her feel better.  Nurse notified of pt's nausea.  Will continue to focus on strengthening and progressive functional mobility per pt tolerance.  D/t pt's generalized weakness and persistent nausea, anticipate pt would benefit from manual w/c at home to allow for safe mobility within home for ADL's (pt reports her husband can assist her 24/7).   Patient suffers from persistent nausea, gastritis, and weakness which impairs her ability to perform daily activities like toileting, feeding, dressing, grooming, bathing in the home. A cane, walker, crutch will not resolve the patient's issue with performing activities of daily living. A lightweight wheelchair is required/recommended and will allow patient to safely perform daily activities.   Patient can safely propel the wheelchair in the home or has a caregiver who can provide assistance.     Follow Up Recommendations  Home health PT;Supervision/Assistance - 24 hour     Equipment Recommendations  Rolling walker with 5" wheels;3in1 (PT);Wheelchair (measurements PT);Wheelchair cushion (measurements  PT)    Recommendations for Other Services OT consult     Precautions / Restrictions Precautions Precautions: Fall Precaution Comments: limited mobility tolerance due to N&V Restrictions Weight Bearing Restrictions: No    Mobility  Bed Mobility Overal bed mobility: Needs Assistance Bed Mobility: Rolling;Sidelying to Sit Rolling: Supervision(vc's for technique and bedrail use) Sidelying to sit: Min assist       General bed mobility comments: min assist for trunk L sidelying to sitting  Transfers Overall transfer level: Needs assistance Equipment used: Rolling walker (2 wheeled) Transfers: Sit to/from Stand Sit to Stand: Min guard         General transfer comment: CGA to stand from bed up to walker and to sit down in recliner (vc's to reach back for armrests prior to sitting)  Ambulation/Gait Ambulation/Gait assistance: Min guard;Min assist Gait Distance (Feet): 3 Feet(bed to recliner) Assistive device: Rolling walker (2 wheeled)   Gait velocity: decreased   General Gait Details: decreased B LE step length; steady going forward and turning but min assist to steady when taking a few steps backwards   Optometrist    Modified Rankin (Stroke Patients Only)       Balance Overall balance assessment: Needs assistance Sitting-balance support: Feet supported Sitting balance-Leahy Scale: Good Sitting balance - Comments: steady static sitting and reaching within BOS   Standing balance support: Single extremity supported Standing balance-Leahy Scale: Fair Standing balance comment: pt requiring at least single UE support for static standing balance  Cognition Arousal/Alertness: Awake/alert Behavior During Therapy: WFL for tasks assessed/performed Overall Cognitive Status: Within Functional Limits for tasks assessed                                        Exercises       General Comments   Nursing cleared pt for participation in physical therapy.  Pt agreeable to PT session.      Pertinent Vitals/Pain Pain Assessment: No/denies pain  Vitals (HR and O2 on room air) stable and WFL throughout treatment session.    Home Living                      Prior Function            PT Goals (current goals can now be found in the care plan section) Acute Rehab PT Goals Patient Stated Goal: to get stronger and feel better PT Goal Formulation: With patient Time For Goal Achievement: 07/28/19 Potential to Achieve Goals: Good Progress towards PT goals: Progressing toward goals    Frequency    Min 2X/week      PT Plan Current plan remains appropriate    Co-evaluation              AM-PAC PT "6 Clicks" Mobility   Outcome Measure  Help needed turning from your back to your side while in a flat bed without using bedrails?: None Help needed moving from lying on your back to sitting on the side of a flat bed without using bedrails?: A Little Help needed moving to and from a bed to a chair (including a wheelchair)?: A Little   Help needed to walk in hospital room?: A Little Help needed climbing 3-5 steps with a railing? : A Lot 6 Click Score: 15    End of Session Equipment Utilized During Treatment: Gait belt Activity Tolerance: Patient limited by fatigue;Other (comment)(Limited d/t nausea) Patient left: in chair;with call bell/phone within reach;with chair alarm set;Other (comment)(B heels floating via pillow) Nurse Communication: Mobility status;Other (comment)(pt's nausea) PT Visit Diagnosis: Unsteadiness on feet (R26.81);Muscle weakness (generalized) (M62.81);Difficulty in walking, not elsewhere classified (R26.2)     Time: 1610-9604 PT Time Calculation (min) (ACUTE ONLY): 25 min  Charges:  $Therapeutic Activity: 23-37 mins                     Leitha Bleak, PT 07/23/19, 12:36 PM

## 2019-07-23 NOTE — TOC Progression Note (Signed)
Transition of Care [facility]) - Progression Note    Patient Details  Name: Deanna Joseph MRN: [id number] Date of Birth: 1965/09/21  [facility] of [facility]) CM/SW Contact  Nethaniel Mattie, Gardiner Rhyme, LCSW Phone Number: 07/23/2019, 3:37 PM  Clinical Narrative:   Met with pt and husband who was in the room with her. She feels better at times and at other times does not. Discussed equipment needs. She has a rollator rolling walker but does not have a bsc. Discussed home health and plan to see in am to see if MD discharging. Husband feels she is better. RN working on pt having a bm. See in am.    Expected Discharge Plan: Home/Self Care Barriers to Discharge: Inadequate or no insurance, Continued Medical Work up  Expected Discharge Plan and Services Expected Discharge Plan: Home/Self Care In-house Referral: Clinical Social Work     Living arrangements for the past 2 months: Single Family Home                                       Social Determinants of Health (SDOH) Interventions    Readmission Risk Interventions No flowsheet data found.

## 2019-07-23 NOTE — Progress Notes (Signed)
[facility] Progress Note  [name]    OXB:[id number]  DOA: 07/17/2019     Date of Service: the patient was seen and examined on 07/23/2019  Chief Complaint  Patient presents with  . Emesis   Brief hospital course: Type II DM, HTN, unintentional weight loss, chronic gastritis and H. pylori. Presents with intractable nausea and vomiting and 30 pound weight loss in last 3 months. SP EGD 07/18/2019 which shows chronic gastritis.  Negative for metaplasia or dysplasia and malignancy. Currently further plan is monitor for tolerance of oral diet.  Assessment and Plan: 1.  Acute on chronic gastritis. Suspecting diabetic gastroparesis. History of H. pylori infection Severe constipation Unintentional weight loss Elevated lipase Extensive work-up performed. EGD shows evidence of chronic gastritis without any acute abnormality. Patient was started on Reglan and bowel regimen so far no response or improvement. Continue PPI twice daily. Reglan dose increased on 07/22/2019. MRI brain was also performed to rule out any central process which is negative for any acute abnormality. May require repeat GI evaluation if does not improve.  2.  Acute kidney injury. Metabolic acidosis. Hypokalemia. Improving with IV hydration. Currently avoiding nephrotoxic medication. Renal function back to normal. Hypokalemia resolved. Monitor.  3.  Type 2 diabetes mellitus with hyperlipidemia. Diet controlled. Hemoglobin A1c 6.5. Continuing Lipitor. Continue sliding scale insulin.  4.  Left eye ptosis Bell's palsy Outpatient follow-up.  5.  Depression Continue Paxil. Will add Remeron.  Diet: Regular diet DVT Prophylaxis: Subcutaneous Heparin    Advance goals of care discussion: Full code  Family Communication: no family was present at bedside, at the time of interview.   Disposition:  Pt is from home, admitted with nausea and vomiting, still has minimal p.o. intake, which  precludes a safe discharge. Discharge to home with home health, when able to tolerate oral.  Subjective: Continues to have difficult time with eating.  Reports persistent nausea whenever she is changing her position.  Passing gas but no BM still.  Physical Exam: General: alert oriented to time, place, and person.  Appear in mild distress, affect appropriate Eyes: PERRL ENT: Oral Mucosa Clear, moist  Neck: no JVD,  Cardiovascular: S1 and S2 Present, no Murmur,  Respiratory: good respiratory effort, Bilateral Air entry equal and Decreased, no Crackles, no wheezes Abdomen: Bowel Sound present, Soft and no tenderness,  Skin: no rash Extremities: no Pedal edema, no calf tenderness Neurologic: without any new focal findings Gait not checked due to patient safety concerns  Vitals:   07/22/19 0043 07/22/19 0803 07/22/19 1539 07/23/19 0000  BP: 109/79 124/76 108/73 105/68  Pulse: 100 88 93 98  Resp: 16  18 16   Temp: 98.6 F (37 C) 98.4 F (36.9 C) 98.2 F (36.8 C) 98.3 F (36.8 C)  TempSrc:  Oral Oral Oral  SpO2: 100% 99% 100% 99%  Weight:      Height:        Intake/Output Summary (Last 24 hours) at 07/23/2019 6834 Last data filed at 07/22/2019 2200 Gross per 24 hour  Intake 886.88 ml  Output --  Net 886.88 ml   Filed Weights   07/17/19 1755  Weight: 54.8 kg    Data Reviewed: I have personally reviewed and interpreted daily labs, tele strips, imagings as discussed above. I reviewed all nursing notes, pharmacy notes, vitals, pertinent old records I have discussed plan of care as described above with RN and patient/family.  CBC: Recent Labs  Lab 07/18/19 0253 07/20/19 0636 07/21/19 0434 07/22/19  5621 07/23/19 0256  WBC 6.0 3.8* 3.3* 3.6* 3.8*  HGB 12.0 10.7* 11.2* 11.6* 11.6*  HCT 35.2* 31.2* 32.4* 33.4* 34.2*  MCV 80.2 79.6* 79.0* 78.6* 80.3  PLT 156 127* 141* 124* 127*   Basic Metabolic Panel: Recent Labs  Lab 07/19/19 1111 07/20/19 0636 07/21/19 0434  07/22/19 0428 07/23/19 0256  NA 139 135 135 136 135  K 3.3* 3.1* 3.1* 3.2* 4.8  CL 107 100 100 100 104  CO2 17* 23 21* 20* 20*  GLUCOSE 82 80 76 77 83  BUN 15 9 9 11 15   CREATININE 0.83 0.65 0.68 0.68 0.77  CALCIUM 8.4* 8.2* 8.2* 8.3* 8.4*  MG 1.7  --   --  1.8  --     Studies: No results found.  Scheduled Meds: . atorvastatin  20 mg Oral q1800  . enoxaparin (LOVENOX) injection  40 mg Subcutaneous Q24H  . feeding supplement (ENSURE ENLIVE)  237 mL Oral TID BM  . metoCLOPramide (REGLAN) injection  10 mg Intravenous TID AC & HS  . pantoprazole  40 mg Oral BID  . PARoxetine  20 mg Oral Daily  . psyllium  1 packet Oral Daily  . senna-docusate  1 tablet Oral BID   Continuous Infusions: PRN Meds: acetaminophen **OR** acetaminophen, ondansetron **OR** ondansetron (ZOFRAN) IV  Time spent: 35 minutes  Author: , MD [facility] 07/23/2019 7:52 AM  To reach On-call, see care teams to locate the attending and reach out to them via [email or web address]. If 7PM-7AM, please contact night-coverage If you still have difficulty reaching the attending provider, please page the [facility] (Director on Call) for [facility] on amion for assistance.

## 2019-07-24 DIAGNOSIS — E43 Unspecified severe protein-calorie malnutrition: Secondary | ICD-10-CM | POA: Insufficient documentation

## 2019-07-24 LAB — GLUCOSE, CAPILLARY
Glucose-Capillary: 88 mg/dL (ref 70–99)
Glucose-Capillary: 126 mg/dL — ABNORMAL HIGH (ref 70–99)
Glucose-Capillary: 214 mg/dL — ABNORMAL HIGH (ref 70–99)

## 2019-07-24 LAB — HEMOGLOBIN AND HEMATOCRIT, BLOOD
HCT: 44.2 % (ref 36.0–46.0)
Hemoglobin: 14.4 g/dL (ref 12.0–15.0)

## 2019-07-24 LAB — CBC
HCT: 35.2 % — ABNORMAL LOW (ref 36.0–46.0)
Hemoglobin: 11.9 g/dL — ABNORMAL LOW (ref 12.0–15.0)
MCH: 27.2 pg (ref 26.0–34.0)
MCHC: 33.8 g/dL (ref 30.0–36.0)
MCV: 80.4 fL (ref 80.0–100.0)
Platelets: 139 10*3/uL — ABNORMAL LOW (ref 150–400)
RBC: 4.38 MIL/uL (ref 3.87–5.11)
RDW: 18 % — ABNORMAL HIGH (ref 11.5–15.5)
WBC: 4.7 10*3/uL (ref 4.0–10.5)
nRBC: 0.4 % — ABNORMAL HIGH (ref 0.0–0.2)

## 2019-07-24 LAB — BASIC METABOLIC PANEL
Anion gap: 11 (ref 5–15)
BUN: 16 mg/dL (ref 6–20)
CO2: 24 mmol/L (ref 22–32)
Calcium: 8.6 mg/dL — ABNORMAL LOW (ref 8.9–10.3)
Chloride: 102 mmol/L (ref 98–111)
Creatinine, Ser: 0.78 mg/dL (ref 0.44–1.00)
GFR calc Af Amer: >60 mL/min (ref >60)
GFR calc non Af Amer: >60 mL/min (ref >60)
Glucose, Bld: 85 mg/dL (ref 70–99)
Potassium: 4.9 mmol/L (ref 3.5–5.1)
Sodium: 137 mmol/L (ref 135–145)

## 2019-07-24 MED ORDER — SODIUM CHLORIDE 0.9 % IV BOLUS
1000.0000 mL | Freq: Once | INTRAVENOUS | Status: AC
  Administered 2019-07-24: 1000 mL via INTRAVENOUS

## 2019-07-24 MED ORDER — POLYETHYLENE GLYCOL 3350 17 G PO PACK
34.0000 g | PACK | ORAL | Status: DC
  Administered 2019-07-24 (×2): 34 g via ORAL
  Filled 2019-07-24 (×2): qty 2

## 2019-07-24 MED ORDER — SORBITOL 70 % SOLN
960.0000 mL | TOPICAL_OIL | Freq: Once | ORAL | Status: AC
  Administered 2019-07-24: 960 mL via RECTAL
  Filled 2019-07-24: qty 473

## 2019-07-24 NOTE — Progress Notes (Addendum)
    BRIEF OVERNIGHT PROGRESS REPORT   SUBJECTIVE: Per primary RN " patient had melena x1 when i came on at 1930; now had small, about liquid, red clot bowel movement"  OBJECTIVE: Patient assessed at the bedside, he was afebrile with blood pressure 91/74mm Hg and pulse rate 96 beats/min. There were no focal neurological deficits; he was alert and oriented x4  ASSESSMENT:Type II DM, HTN, unintentional weight loss, chronic gastritis and H. Pylori. Presents with intractable nausea and vomiting and 30 pound weight loss in last 3 months. S/p EGD 07/18/2019 which shows chronic gastritis.  Negative for metaplasia or dysplasia and malignancy.  PLAN: 1. GI Bleed - Acute/Chronic, with evidence hemodynamic instability (Hypotension). Positive  Hematochezia, Melena,  - IVF resuscitation to maintain MAP>65 - H&H monitoring q6h - Blood Consent.  Transfuse PRN Hgb<8 - Pantoprazole 40mg  IV BID - NPO for now pending GI input - Consider GI consult again if continues to have bloody stools for possible for Colonoscopy - Hold NSAIDs, steroids, ASA  Hold Lovenox for now due to suspected GI bleed and pending repeat blood work. If stable may consider restarting anticoagulation.    , DNP, CCRN, FNP-C [facility] Nurse Practitioner Between 7pm to 7am - Pager [phone]  After 7am go to [email or web address] - [email or web address]  [facility]  [phone]
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
## 2019-07-24 NOTE — Progress Notes (Signed)
[facility] Progress Note  [name]    ION:[id number]  DOA: 07/17/2019     Date of Service: the patient was seen and examined on 07/24/2019  Chief Complaint  Patient presents with  . Emesis   Brief hospital course: Type II DM, HTN, unintentional weight loss, chronic gastritis and H. pylori. Presents with intractable nausea and vomiting and 30 pound weight loss in last 3 months. SP EGD 07/18/2019 which shows chronic gastritis.  Negative for metaplasia or dysplasia and malignancy. Currently further plan is monitor for tolerance of oral diet.  Assessment and Plan: # Nausea and reduced appetite likely due to severe constipation and stool impaction # Weight loss due to decreased PO intake --Pt reported not having a real BM for at least 2 weeks.  CT a/p on presentation showed "moderate stool throughout the colon."   PLAN: --High-dose enema, which pt had trouble taking all --Enema, which successfully produced large and continued BM's.  Acute on chronic gastritis. History of H. pylori infection Elevated lipase Extensive work-up performed. EGD shows evidence of chronic gastritis without any acute abnormality. Patient was started on Reglan and bowel regimen so far no response or improvement. Continue PPI twice daily. Reglan dose increased on 07/22/2019. MRI brain was also performed to rule out any central process which is negative for any acute abnormality.  2.  Acute kidney injury, resolved Metabolic acidosis. Hypokalemia. Improving with IV hydration. Currently avoiding nephrotoxic medication. Renal function back to normal. Hypokalemia resolved. Monitor.  3.  Type 2 diabetes mellitus with hyperlipidemia. Diet controlled. Hemoglobin A1c 6.5. Continuing Lipitor. Continue sliding scale insulin.  4.  Left eye ptosis Bell's palsy Outpatient follow-up.  5.  Depression Continue Paxil. Continue Remeron (new)  Diet: Regular diet DVT Prophylaxis: Subcutaneous  Heparin    Advance goals of care discussion: Full code  Family Communication: Husband updated at bedside.  Disposition:  Pt is from home, admitted with nausea and vomiting.  Now that pt had bowel cleaned out, if starting to having better PO intake, then will discharge tomorrow to home with home health.  Subjective:  Still having difficult taking in PO, reported no BM in at least 2 weeks.  Also complained of dizziness when up from bed, even with just sitting.  No fever, dyspnea, chest pain, dysuria.  High-dose Miralax ordered which pt had difficulty taking.  Enema ordered with great result in producing large BM's.   Physical Exam: Constitutional: NAD, AAOx3 HEENT: conjunctivae and lids normal, EOMI CV: RRR no M,R,G. Distal pulses +2.  No cyanosis.   RESP: CTA B/L, normal respiratory effort  GI: +BS, tender to palpation Extremities: No effusions, edema, or tenderness in BLE SKIN: warm, dry and intact Neuro: II - XII grossly intact.  Sensation intact Psych: Normal mood and affect.  Appropriate judgement and reason    Vitals:   07/23/19 1701 07/24/19 0022 07/24/19 0802 07/24/19 1521  BP: 108/69 112/75 108/73 104/67  Pulse: 91 (!) 106 83 (!) 104  Resp: 16 16 17 18   Temp: 98.5 F (36.9 C) 98.7 F (37.1 C) 98 F (36.7 C) 98.3 F (36.8 C)  TempSrc: Oral Oral Oral Oral  SpO2: 100% 99% 99% 100%  Weight:      Height:       No intake or output data in the 24 hours ending 07/24/19 1806 Filed Weights   07/17/19 1755  Weight: 54.8 kg    Data Reviewed: I have personally reviewed and interpreted daily labs, tele strips, imagings as  discussed above. I reviewed all nursing notes, pharmacy notes, vitals, pertinent old records I have discussed plan of care as described above with RN and patient/family.  CBC: Recent Labs  Lab 07/20/19 0636 07/21/19 0434 07/22/19 0428 07/23/19 0256 07/24/19 0507  WBC 3.8* 3.3* 3.6* 3.8* 4.7  HGB 10.7* 11.2* 11.6* 11.6* 11.9*  HCT 31.2* 32.4*  33.4* 34.2* 35.2*  MCV 79.6* 79.0* 78.6* 80.3 80.4  PLT 127* 141* 124* 127* 563*   Basic Metabolic Panel: Recent Labs  Lab 07/19/19 1111 07/19/19 1111 07/20/19 0636 07/21/19 0434 07/22/19 0428 07/23/19 0256 07/24/19 0507  NA 139   < > 135 135 136 135 137  K 3.3*   < > 3.1* 3.1* 3.2* 4.8 4.9  CL 107   < > 100 100 100 104 102  CO2 17*   < > 23 21* 20* 20* 24  GLUCOSE 82   < > 80 76 77 83 85  BUN 15   < > 9 9 11 15 16   CREATININE 0.83   < > 0.65 0.68 0.68 0.77 0.78  CALCIUM 8.4*   < > 8.2* 8.2* 8.3* 8.4* 8.6*  MG 1.7  --   --   --  1.8  --   --    < > = values in this interval not displayed.    Studies: No results found.  Scheduled Meds: . atorvastatin  20 mg Oral q1800  . enoxaparin (LOVENOX) injection  40 mg Subcutaneous Q24H  . feeding supplement (ENSURE ENLIVE)  237 mL Oral TID BM  . metoCLOPramide  10 mg Oral TID AC  . mirtazapine  7.5 mg Oral QHS  . pantoprazole  40 mg Oral BID  . PARoxetine  20 mg Oral Daily  . polyethylene glycol  17 g Oral Daily  . polyethylene glycol  34 g Oral Q2H  . senna-docusate  1 tablet Oral BID   Continuous Infusions: PRN Meds: acetaminophen **OR** acetaminophen, ondansetron **OR** ondansetron (ZOFRAN) IV  Enzo Bi, MD [facility] 07/24/2019 6:06 PM  To reach On-call, see care teams to locate the attending and reach out to them via [email or web address]. If 7PM-7AM, please contact night-coverage If you still have difficulty reaching the attending provider, please page the [facility] (Director on Call) for [facility] on amion for assistance.

## 2019-07-24 NOTE — Progress Notes (Signed)
Nutrition Follow-up  DOCUMENTATION CODES:   Severe malnutrition in context of chronic illness  INTERVENTION:  Continue Ensure Enlive po TID, each supplement provides 350 kcal and 20 grams of protein. Encouraged patient to take scheduled sips of Ensure every 10 to 15 minutes to help meet more of her calorie/protein intake.  If patient continues to have poor PO intake need to consider placement of small-bore post-pyloric feeding tube by diagnostic radiology under fluoroscopy if this aligns with patient's goals of care.   NUTRITION DIAGNOSIS:   Severe Malnutrition related to chronic illness(intractable N/V) as evidenced by energy intake < or equal to 75% for > or equal to 1 month, 27.7% weight loss over 4 months, mild-moderate fat depletion, moderate-severe muscle depletion.  New nutrition diagnosis.  GOAL:   Patient will meet greater than or equal to 90% of their needs  Not progressing.  MONITOR:   PO intake, Supplement acceptance, Labs, Weight trends, I & O's  REASON FOR ASSESSMENT:   Malnutrition Screening Tool    ASSESSMENT:   54 year old Caucasian female with a PMH of H. pylori gastritis 2015, T2DM, HTN, s/p cholecystectomy admitted for intractable nausea and vomiting and significant weight loss x 3 months  Met with patient and her husband at bedside. Patient reports she has ongoing poor PO intake. She reports she has not been eating well since January when her A1c went up to 11 (cannot find an A1c that high in her chart or Care Everywhere; recent A1c on 2/9 was 6.5). She reports she is only taking 1-2 bites of foods at meals and a few sips of beverages. She tolerates the liquids better such as soups but is still only taking bites/sips. She is also struggling with drinking Ensure and reports she sips on the Ensure throughout the day but it taking in <1 bottle per day (<350 kcal and 20 grams of protein daily). She has been meeting <75% of estimated energy requirements for >/=1  month. Patient has now had known poor PO intake for 6 days and she reports it was going on longer than that PTA. Encouraged patient to try to sip on the Ensure every 10-15 minutes so it is scheduled to see if that will improve intake. Patient reports she has not had a BM since January. Noted she was on aggressive bowel regimen.  Patient reports her UBW was 160 lbs and she has lost approximately 40 lbs. According to chart she was 75.8 kg on 03/16/2019. She was 54.8 kg (120.8 lbs) on 07/17/2019. She has lost 21 kg (27.7% body weight) over 4 months, which is significant for time frame.  Medications reviewed and include: Reglan 10 mg TID, Remeron 7.5 mg QHS, pantoprazole, Miralax, senna-docusate, SMOG enema.  Labs reviewed: CBG 82-92.  NUTRITION - FOCUSED PHYSICAL EXAM:    Most Recent Value  Orbital Region  Mild depletion  Upper Arm Region  Moderate depletion  Thoracic and Lumbar Region  Mild depletion  Buccal Region  Mild depletion  Temple Region  Moderate depletion  Clavicle Bone Region  Moderate depletion  Clavicle and Acromion Bone Region  Severe depletion  Scapular Bone Region  Moderate depletion  Dorsal Hand  Moderate depletion  Patellar Region  Severe depletion  Anterior Thigh Region  Severe depletion  Posterior Calf Region  Severe depletion  Edema (RD Assessment)  -- [non-pitting]  Hair  Reviewed  Eyes  Reviewed  Mouth  Reviewed  Skin  Reviewed  Nails  Reviewed     Diet Order:   Diet  Order            Diet regular Room service appropriate? Yes; Fluid consistency: Thin  Diet effective now             EDUCATION NEEDS:   Education needs have been addressed  Skin:  Skin Assessment: Reviewed RN Assessment  Last BM:  06/12/2019 per chart  Height:   Ht Readings from Last 1 Encounters:  07/17/19 5' 1"  (1.549 m)   Weight:   Wt Readings from Last 1 Encounters:  07/17/19 54.8 kg   Ideal Body Weight:  47.7 kg  BMI:  Body mass index is 22.82 kg/m.  Estimated  Nutritional Needs:   Kcal:  1500-1700kcal/day  Protein:  75-85g/day  Fluid:  >1.4L/day  Jacklynn Barnacle, MS, RD, LDN Pager number available on Amion

## 2019-07-25 LAB — HEPATIC FUNCTION PANEL
ALT: 17 U/L (ref 0–44)
AST: 19 U/L (ref 15–41)
Albumin: 3.3 g/dL — ABNORMAL LOW (ref 3.5–5.0)
Alkaline Phosphatase: 65 U/L (ref 38–126)
Bilirubin, Direct: 0.2 mg/dL (ref 0.0–0.2)
Indirect Bilirubin: 1.5 mg/dL — ABNORMAL HIGH (ref 0.3–0.9)
Total Bilirubin: 1.7 mg/dL — ABNORMAL HIGH (ref 0.3–1.2)
Total Protein: 6.6 g/dL (ref 6.5–8.1)

## 2019-07-25 LAB — CBC
HCT: 41.8 % (ref 36.0–46.0)
Hemoglobin: 14.2 g/dL (ref 12.0–15.0)
MCH: 27.4 pg (ref 26.0–34.0)
MCHC: 34 g/dL (ref 30.0–36.0)
MCV: 80.5 fL (ref 80.0–100.0)
Platelets: 200 10*3/uL (ref 150–400)
RBC: 5.19 MIL/uL — ABNORMAL HIGH (ref 3.87–5.11)
RDW: 18.4 % — ABNORMAL HIGH (ref 11.5–15.5)
WBC: 8.6 10*3/uL (ref 4.0–10.5)
nRBC: 0.7 % — ABNORMAL HIGH (ref 0.0–0.2)

## 2019-07-25 LAB — GLUCOSE, CAPILLARY
Glucose-Capillary: 87 mg/dL (ref 70–99)
Glucose-Capillary: 175 mg/dL — ABNORMAL HIGH (ref 70–99)
Glucose-Capillary: 165 mg/dL — ABNORMAL HIGH (ref 70–99)
Glucose-Capillary: 148 mg/dL — ABNORMAL HIGH (ref 70–99)
Glucose-Capillary: 147 mg/dL — ABNORMAL HIGH (ref 70–99)

## 2019-07-25 LAB — HEMOGLOBIN AND HEMATOCRIT, BLOOD
HCT: 37.1 % (ref 36.0–46.0)
Hemoglobin: 12.6 g/dL (ref 12.0–15.0)

## 2019-07-25 LAB — MAGNESIUM: Magnesium: 2.3 mg/dL (ref 1.7–2.4)

## 2019-07-25 LAB — BASIC METABOLIC PANEL
Anion gap: 20 — ABNORMAL HIGH (ref 5–15)
BUN: 27 mg/dL — ABNORMAL HIGH (ref 6–20)
CO2: 16 mmol/L — ABNORMAL LOW (ref 22–32)
Calcium: 9.1 mg/dL (ref 8.9–10.3)
Chloride: 100 mmol/L (ref 98–111)
Creatinine, Ser: 1.29 mg/dL — ABNORMAL HIGH (ref 0.44–1.00)
GFR calc Af Amer: 55 mL/min — ABNORMAL LOW (ref >60)
GFR calc non Af Amer: 47 mL/min — ABNORMAL LOW (ref >60)
Glucose, Bld: 196 mg/dL — ABNORMAL HIGH (ref 70–99)
Potassium: 5.2 mmol/L — ABNORMAL HIGH (ref 3.5–5.1)
Sodium: 136 mmol/L (ref 135–145)

## 2019-07-25 MED ORDER — SODIUM CHLORIDE 0.9 % IV SOLN: INTRAVENOUS | Status: AC

## 2019-07-25 MED ORDER — OSMOLITE 1.2 CAL PO LIQD: 1000.0000 mL | ORAL | Status: DC

## 2019-07-25 MED ORDER — SODIUM CHLORIDE 0.9 % IV BOLUS
1000.0000 mL | Freq: Once | INTRAVENOUS | Status: AC
  Administered 2019-07-25: 1000 mL via INTRAVENOUS

## 2019-07-25 MED ORDER — POLYETHYLENE GLYCOL 3350 17 G PO PACK
17.0000 g | PACK | Freq: Two times a day (BID) | ORAL | Status: DC
  Administered 2019-07-25 – 2019-07-29 (×5): 17 g via ORAL
  Filled 2019-07-25 (×6): qty 1

## 2019-07-25 MED ORDER — ENOXAPARIN SODIUM 40 MG/0.4ML ~~LOC~~ SOLN
40.0000 mg | SUBCUTANEOUS | Status: DC
  Administered 2019-07-25 – 2019-07-27 (×3): 40 mg via SUBCUTANEOUS
  Filled 2019-07-25 (×3): qty 0.4

## 2019-07-25 NOTE — Progress Notes (Signed)
Pt with x1 melena stool, then x1 small bright red clotty watery bowel movement, BP soft 91/67 HR 96 SPO2 100% on room air Webb Silversmith NP aware, H+H stat, NS bolus, VSS post bolus, Hgb 14.4

## 2019-07-25 NOTE — Progress Notes (Signed)
PT Cancellation Note  Patient Details Name: Deanna Joseph MRN: [id number] DOB: 28-Jun-1965   Cancelled Treatment:    Reason Eval/Treat Not Completed: Medical issues which prohibited therapy   Potassium 5.2 and HR in 140's this am.  Will hold session and continue as appropriate.   Danielle Dess 07/25/2019, 10:42 AM

## 2019-07-25 NOTE — Progress Notes (Signed)
OT Cancellation Note  Patient Details Name: Deanna Joseph MRN: [id number] DOB: 1965/09/21   Cancelled Treatment:    Reason Eval/Treat Not Completed: Medical issues which prohibited therapy (Per chart, pt potassium noted to be 5.2 and HR in 140's this am.  Will hold session and continue as appropriate).   Rockney Ghee, M.S., OTR/L Ascom: [phone] 07/25/19, 10:53 AM

## 2019-07-25 NOTE — Progress Notes (Signed)
Ch met with Pt as part of routine rounding. Pt was on the phone with her mom when Ch entered. Pt said that she had diabetes and some other things that she does not know how to name. Pt reported that she cannot see too well. Pt requested for prayer after she had shared how she was feeling. Ch prayed with Pt. Pt was grateful for prayer.    07/25/19 1407  Clinical Encounter Type  Visited With Patient  Visit Type Initial;Psychological support;Spiritual support  Referral From Other (Comment)  Consult/Referral To  (Routine)  Spiritual Encounters  Spiritual Needs Prayer;Emotional

## 2019-07-25 NOTE — Progress Notes (Signed)
[facility] Progress Note  [name]    PYP:[id number]  DOA: 07/17/2019     Date of Service: the patient was seen and examined on 07/25/2019  Chief Complaint  Patient presents with  . Emesis   Brief hospital course: Type II DM, HTN, unintentional weight loss, chronic gastritis and H. pylori. Presents with intractable nausea and vomiting and 30 pound weight loss in last 3 months. SP EGD 07/18/2019 which shows chronic gastritis.  Negative for metaplasia or dysplasia and malignancy. Currently further plan is monitor for tolerance of oral diet.  Assessment and Plan: # Nausea and reduced appetite likely due to severe constipation and stool impaction --Pt reported not having a real BM for at least 2 weeks.  CT a/p on presentation showed "moderate stool throughout the colon."   --high-dose Miralax and enema produced large BM's on 2/16 PLAN: --continue scheduled miralax BID  # Weight loss due to decreased PO intake # Failure to thrive --Pt has not had significant PO intake since presentation.  Albumin trended down to 3.3 today from 4.2 on presentation. --Constipation resolved, but pt is slow to return to normal PO intake.  Discussed with pt that temp tube feed may help her regain strength and jump start her appetite. PLAN: --Place Dobhoff tube and start continuous tube feed --Ad lib oral intake  # Reported bloody (or red) watery stools --Hgb remained stable and wnl.  Maybe due to strawberry [place] ice? PLAN: --continue to monitor for Hgb and BP  Acute on chronic gastritis. History of H. pylori infection Elevated lipase Extensive work-up performed. EGD shows evidence of chronic gastritis without any acute abnormality. Patient was started on Reglan and bowel regimen so far no response or improvement. Continue PPI twice daily. Reglan dose increased on 07/22/2019. MRI brain was also performed to rule out any central process which is negative for any acute  abnormality.  2.  Acute kidney injury --likely pre-renal.   --IVF boluses f/b MIVF@75   Metabolic acidosis. Hypokalemia.  3.  Type 2 diabetes mellitus with hyperlipidemia. Diet controlled. Hemoglobin A1c 6.5. Continuing Lipitor. Continue sliding scale insulin.  4.  Left eye ptosis Bell's palsy Outpatient follow-up.  5.  Depression Continue Paxil. Continue Remeron (new)   Diet: Regular diet  DVT Prophylaxis: Subcutaneous Heparin    Advance goals of care discussion: Full code  Family Communication: not today  Disposition:  Pt is from home, admitted with nausea and vomiting.  Now that pt had bowel cleaned out, pt felt better and less nauseated, but is slow to return to normal PO intake.  Given the amount of time pt has not had proper nutrition, decision was made to start temp tube feed to help pt regain some strength and hopefully jump start her appetite.  Also needs to monitor for BP and resolution of AKI.   Subjective:  Pt had large BM's yesterday with high-dose Miralax and enema.    Overnight and this morning, pt was noted to have watery red stools, however, Hgb has remained wnl and stable.  Pt was also hypotensive overnight and this morning, so total of 2L boluses given with improvement.  Pt reported feeling a bit better today, but very sleepy.  Nausea and dizziness improved, and can now sit up in bed, however, still not eating very much.  No fever, dyspnea, chest pain, increased swelling.  Discussed option of temp tube feed, and pt was agreeable.   Physical Exam: Constitutional: NAD, AAOx3 HEENT: conjunctivae and lids normal, EOMI CV:  RRR tachycardic, no M,R,G. Distal pulses +2.  No cyanosis.   RESP: CTA B/L, normal respiratory effort  GI: +BS, NTND, soft Extremities: No effusions, edema, or tenderness in BLE SKIN: warm, dry and intact Neuro: II - XII grossly intact.  Sensation intact   Vitals:   07/25/19 1634 07/25/19 1853 07/25/19 1855 07/25/19 1958  BP:  116/75 (!) 78/65 104/75 124/82  Pulse: (!) 122 (!) 116 (!) 116 (!) 117  Resp: 14   16  Temp: 98.6 F (37 C)   98.2 F (36.8 C)  TempSrc: Oral     SpO2: 100%   100%  Weight:      Height:        Intake/Output Summary (Last 24 hours) at 07/25/2019 2002 Last data filed at 07/25/2019 1300 Gross per 24 hour  Intake 240 ml  Output 0 ml  Net 240 ml   Filed Weights   07/17/19 1755  Weight: 54.8 kg    Data Reviewed: I have personally reviewed and interpreted daily labs, tele strips, imagings as discussed above. I reviewed all nursing notes, pharmacy notes, vitals, pertinent old records I have discussed plan of care as described above with RN and patient/family.  CBC: Recent Labs  Lab 07/21/19 0434 07/21/19 0434 07/22/19 0428 07/22/19 0428 07/23/19 0256 07/24/19 0507 07/24/19 2130 07/25/19 0505 07/25/19 1139  WBC 3.3*  --  3.6*  --  3.8* 4.7  --  8.6  --   HGB 11.2*   < > 11.6*   < > 11.6* 11.9* 14.4 14.2 12.6  HCT 32.4*   < > 33.4*   < > 34.2* 35.2* 44.2 41.8 37.1  MCV 79.0*  --  78.6*  --  80.3 80.4  --  80.5  --   PLT 141*  --  124*  --  127* 139*  --  200  --    < > = values in this interval not displayed.   Basic Metabolic Panel: Recent Labs  Lab 07/19/19 1111 07/20/19 0636 07/21/19 0434 07/22/19 0428 07/23/19 0256 07/24/19 0507 07/25/19 0505  NA 139   < > 135 136 135 137 136  K 3.3*   < > 3.1* 3.2* 4.8 4.9 5.2*  CL 107   < > 100 100 104 102 100  CO2 17*   < > 21* 20* 20* 24 16*  GLUCOSE 82   < > 76 77 83 85 196*  BUN 15   < > 9 11 15 16  27*  CREATININE 0.83   < > 0.68 0.68 0.77 0.78 1.29*  CALCIUM 8.4*   < > 8.2* 8.3* 8.4* 8.6* 9.1  MG 1.7  --   --  1.8  --   --  2.3   < > = values in this interval not displayed.    Studies: No results found.  Scheduled Meds: . atorvastatin  20 mg Oral q1800  . enoxaparin (LOVENOX) injection  40 mg Subcutaneous Q24H  . feeding supplement (ENSURE ENLIVE)  237 mL Oral TID BM  . metoCLOPramide  10 mg Oral TID AC  .  mirtazapine  7.5 mg Oral QHS  . pantoprazole  40 mg Oral BID  . PARoxetine  20 mg Oral Daily  . polyethylene glycol  17 g Oral Daily  . senna-docusate  1 tablet Oral BID   Continuous Infusions: . sodium chloride 75 mL/hr at 07/25/19 1303  . feeding supplement (OSMOLITE 1.2 CAL)     PRN Meds: acetaminophen **OR** acetaminophen, ondansetron **OR** ondansetron (  ZOFRAN) IV  Darlin Priestly, MD [facility] 07/25/2019 8:02 PM  To reach On-call, see care teams to locate the attending and reach out to them via [email or web address]. If 7PM-7AM, please contact night-coverage If you still have difficulty reaching the attending provider, please page the [facility] (Director on Call) for [facility] on amion for assistance.

## 2019-07-25 NOTE — TOC Progression Note (Signed)
Transition of Care [facility]) - Progression Note    Patient Details  Name: Deanna Joseph MRN: [id number] Date of Birth: Dec 22, 1965  Transition of Care [facility]) CM/SW Contact  Franceska Strahm, Gardiner Rhyme, LCSW Phone Number: 07/25/2019, 11:03 AM  Clinical Narrative:  Met pt to see how well she is doing. She reports she is trying to hang in there. Discussed bsc and follow up therapies. She wants to know if she has to pay for this. Will see if she qualifies for charity care and get back with pt. Will see husband when here to answer any questions. Have made PT follow up referral to [facility] awaiting if can accept the case. Will continue to follow and assist with discharge needs.     Expected Discharge Plan: Home/Self Care Barriers to Discharge: Inadequate or no insurance, Continued Medical Work up  Expected Discharge Plan and Services Expected Discharge Plan: Home/Self Care In-house Referral: Clinical Social Work     Living arrangements for the past 2 months: Single Family Home                                       Social Determinants of Health (SDOH) Interventions    Readmission Risk Interventions No flowsheet data found.

## 2019-07-26 ENCOUNTER — Inpatient Hospital Stay: Payer: Self-pay

## 2019-07-26 LAB — GLUCOSE, CAPILLARY
Glucose-Capillary: 113 mg/dL — ABNORMAL HIGH (ref 70–99)
Glucose-Capillary: 116 mg/dL — ABNORMAL HIGH (ref 70–99)
Glucose-Capillary: 118 mg/dL — ABNORMAL HIGH (ref 70–99)
Glucose-Capillary: 118 mg/dL — ABNORMAL HIGH (ref 70–99)
Glucose-Capillary: 92 mg/dL (ref 70–99)

## 2019-07-26 LAB — BASIC METABOLIC PANEL
Anion gap: 10 (ref 5–15)
BUN: 22 mg/dL — ABNORMAL HIGH (ref 6–20)
CO2: 21 mmol/L — ABNORMAL LOW (ref 22–32)
Calcium: 8.1 mg/dL — ABNORMAL LOW (ref 8.9–10.3)
Chloride: 106 mmol/L (ref 98–111)
Creatinine, Ser: 0.86 mg/dL (ref 0.44–1.00)
GFR calc Af Amer: >60 mL/min (ref >60)
GFR calc non Af Amer: >60 mL/min (ref >60)
Glucose, Bld: 144 mg/dL — ABNORMAL HIGH (ref 70–99)
Potassium: 3.7 mmol/L (ref 3.5–5.1)
Sodium: 137 mmol/L (ref 135–145)

## 2019-07-26 LAB — CBC
HCT: 34.6 % — ABNORMAL LOW (ref 36.0–46.0)
Hemoglobin: 11.5 g/dL — ABNORMAL LOW (ref 12.0–15.0)
MCH: 27 pg (ref 26.0–34.0)
MCHC: 33.2 g/dL (ref 30.0–36.0)
MCV: 81.2 fL (ref 80.0–100.0)
Platelets: 160 10*3/uL (ref 150–400)
RBC: 4.26 MIL/uL (ref 3.87–5.11)
RDW: 18.5 % — ABNORMAL HIGH (ref 11.5–15.5)
WBC: 7.9 10*3/uL (ref 4.0–10.5)
nRBC: 0.4 % — ABNORMAL HIGH (ref 0.0–0.2)

## 2019-07-26 LAB — MAGNESIUM: Magnesium: 2 mg/dL (ref 1.7–2.4)

## 2019-07-26 MED ORDER — PRO-STAT SUGAR FREE PO LIQD
30.0000 mL | Freq: Every day | ORAL | Status: DC
  Administered 2019-07-26 – 2019-07-30 (×5): 30 mL

## 2019-07-26 MED ORDER — SODIUM CHLORIDE 0.9 % IV BOLUS
1000.0000 mL | Freq: Once | INTRAVENOUS | Status: AC
  Administered 2019-07-26: 1000 mL via INTRAVENOUS

## 2019-07-26 MED ORDER — OSMOLITE 1.5 CAL PO LIQD
1000.0000 mL | ORAL | Status: DC
  Administered 2019-07-26 – 2019-07-27 (×2): 1000 mL

## 2019-07-26 NOTE — Progress Notes (Signed)
Physical Therapy Treatment Patient Details Name: Deanna Joseph MRN: [id number] DOB: 12-12-1965 Today's Date: 07/26/2019    History of Present Illness 54 yo female with onset of N&V throughout Jan was admitted for testing.  Currently with gastritis, low K+ and general weakness.  PMHx:  gastritis, DM, HTN, AKI    PT Comments    Pt continues to be limited with ability to participate with PT. Today the biggest issue was less nausea but significant lightheadedness symptoms with getting to sitting and even with time to stabilize BP remained low and symptoms persisted. (85/64 with HR 120s, minimal improvement after sitting for a few minutes).  Pt was eager to try and do some walking today, but ultimately was unable to even attempt standing.  Pt has not been able to walk more than a few feet since getting to the hospital.  In all likelihood when she does stabilize she should be able to show appropriate function for going home with HHPT but has not truly shown the ability to safely do so yet.    Follow Up Recommendations  Home health PT;Supervision/Assistance - 24 hour(per improved mobility once medically stable)     Equipment Recommendations  Rolling walker with 5" wheels;3in1 (PT);Wheelchair (measurements PT);Wheelchair cushion (measurements PT)    Recommendations for Other Services       Precautions / Restrictions Precautions Precautions: Fall Precaution Comments: limited mobility tolerance due to N&V, watch HR Restrictions Weight Bearing Restrictions: No    Mobility  Bed Mobility Overal bed mobility: Needs Assistance Bed Mobility: Supine to Sit;Sit to Supine Rolling: Supervision Sidelying to sit: Min assist Supine to sit: HOB elevated;Min guard Sit to supine: Min guard   General bed mobility comments: Pt with lightheadedness/dizziness with sitting to standing.  Pt sat for a while w/o improvement of symptoms, took BP with 85/64 and similar reading a few minutes later  Transfers                 General transfer comment: deferred mobility secondary to low BP in transition to sitting (HR in the 120s with very little activity85/64, then 90/65 with no change in symptoms)    Ambulation/Gait                 Stairs             Wheelchair Mobility    Modified Rankin (Stroke Patients Only)       Balance   Sitting-balance support: Feet supported;No upper extremity supported Sitting balance-Leahy Scale: Fair Sitting balance - Comments: steady static sitting and reaching within BOS                                    Cognition Arousal/Alertness: Awake/alert Behavior During Therapy: WFL for tasks assessed/performed Overall Cognitive Status: No family/caregiver present to determine baseline cognitive functioning                                        Exercises General Exercises - Lower Extremity Ankle Circles/Pumps: Strengthening;10 reps Quad Sets: Strengthening;10 reps Heel Slides: Strengthening;10 reps;Both Hip ABduction/ADduction: Strengthening;10 reps;Both    General Comments        Pertinent Vitals/Pain Pain Assessment: (general soreness, does endorse R flank pain with sitting up) Pain Score: (minimal, unrated)    Home Living  Prior Function            PT Goals (current goals can now be found in the care plan section) Progress towards PT goals: Progressing toward goals    Frequency    Min 2X/week      PT Plan Current plan remains appropriate    Co-evaluation              AM-PAC PT "6 Clicks" Mobility   Outcome Measure  Help needed turning from your back to your side while in a flat bed without using bedrails?: None Help needed moving from lying on your back to sitting on the side of a flat bed without using bedrails?: A Little Help needed moving to and from a bed to a chair (including a wheelchair)?: A Little Help needed standing up from a chair  using your arms (e.g., wheelchair or bedside chair)?: A Little Help needed to walk in hospital room?: A Little Help needed climbing 3-5 steps with a railing? : A Lot 6 Click Score: 18    End of Session   Activity Tolerance: Other (comment)(limited due to low BP) Patient left: with bed alarm set;with call bell/phone within reach Nurse Communication: Mobility status;Other (comment)(low BP) PT Visit Diagnosis: Unsteadiness on feet (R26.81);Muscle weakness (generalized) (M62.81);Difficulty in walking, not elsewhere classified (R26.2)     Time: 3300-7622 PT Time Calculation (min) (ACUTE ONLY): 32 min  Charges:  $Therapeutic Exercise: 8-22 mins $Therapeutic Activity: 8-22 mins                     [name], DPT 07/26/2019, 12:01 PM

## 2019-07-26 NOTE — Progress Notes (Signed)
Pt refused to allow RN to place NG tube. States "I just can't do that." RN educated and even tried again to place at a later time. Pt refused again. Will pass along in report to day shift.

## 2019-07-26 NOTE — Progress Notes (Signed)
[facility] Progress Note  [name]    YIR:[id number]  DOA: 07/17/2019     Date of Service: the patient was seen and examined on 07/26/2019  Chief Complaint  Patient presents with  . Emesis   Brief hospital course: Type II DM, HTN, unintentional weight loss, chronic gastritis and H. pylori. Presents with intractable nausea and vomiting and 30 pound weight loss in last 3 months. SP EGD 07/18/2019 which shows chronic gastritis.  Negative for metaplasia or dysplasia and malignancy. Currently further plan is monitor for tolerance of oral diet.  Assessment and Plan: # Nausea and reduced appetite likely due to severe constipation and stool impaction --Pt reported not having a real BM for at least 2 weeks.  CT a/p on presentation showed "moderate stool throughout the colon."   --high-dose Miralax and enema produced large BM's on 2/16 PLAN: --continue scheduled miralax BID  # Weight loss due to decreased PO intake # Failure to thrive --Pt has not had significant PO intake since presentation.  Albumin trended down to 3.3 today from 4.2 on presentation. --Constipation resolved, but pt is slow to return to normal PO intake.  Discussed with pt that temp tube feed may help her regain strength and jump start her appetite. PLAN: --Placed Dobhoff tube and started continuous tube feed today --Ad lib oral intake  # Reported bloody (or red) watery stools --Hgb remained stable and wnl.  Maybe due to strawberry [place] ice? PLAN: --continue to monitor for Hgb and BP --Avoid drinking anything red  Acute on chronic gastritis. History of H. pylori infection Elevated lipase Extensive work-up performed. EGD shows evidence of chronic gastritis without any acute abnormality. Patient was started on Reglan and bowel regimen so far no response or improvement. Continue PPI twice daily. Reglan dose increased on 07/22/2019. MRI brain was also performed to rule out any central process  which is negative for any acute abnormality.  2.  Acute kidney injury, resolved --likely pre-renal.  Improved with IVF.  Metabolic acidosis. Hypokalemia.  3.  Type 2 diabetes mellitus with hyperlipidemia. Diet controlled. Hemoglobin A1c 6.5. Continuing Lipitor. Continue sliding scale insulin.  4.  Left eye ptosis Bell's palsy Outpatient follow-up.  5.  Depression Continue Paxil. Continue Remeron (new)   Diet: Regular diet, tube feed DVT Prophylaxis: Subcutaneous Heparin    Advance goals of care discussion: Full code  Family Communication: updated husband at bedside  Disposition:  Pt is from home, admitted with nausea and vomiting.  Now that pt had bowel cleaned out, pt felt better and less nauseated, but is slow to return to normal PO intake.  Given the amount of time pt has not had proper nutrition, decision was made to start temp tube feed to help pt regain some strength and hopefully jump start her appetite.  Will do 2 days of tube feed and reassess the need for PEG tube placement.   Subjective:  No more red stools.   Pt denied pain.  BP dropped when pt sat up with PT, and pt did feel dizzy.  Still not eating well.  Later, nursing reported PVD scan of >500 ml.  Bladder scan and I/O cath ordered.  Dobhoff tube placed and tube feed started.   Physical Exam: Constitutional: NAD, AAOx3, appeared tired but calm HEENT: conjunctivae and lids normal, EOMI CV: RRR tachycardic, no M,R,G. Distal pulses +2.  No cyanosis.   RESP: CTA B/L, normal respiratory effort  GI: +BS, NTND, soft Extremities: No effusions, edema, or tenderness  in BLE SKIN: warm, dry and intact Neuro: II - XII grossly intact.  Sensation intact   Vitals:   07/25/19 2345 07/26/19 0349 07/26/19 0811 07/26/19 1556  BP: 124/74 110/70 108/74 120/71  Pulse: (!) 121 (!) 106 (!) 109 99  Resp: 16 18 17 17   Temp: 98.7 F (37.1 C) 98.3 F (36.8 C) 97.8 F (36.6 C) 97.6 F (36.4 C)  TempSrc:  Oral Oral Oral   SpO2: 100% 100% 100% 100%  Weight:      Height:        Intake/Output Summary (Last 24 hours) at 07/26/2019 1624 Last data filed at 07/26/2019 1300 Gross per 24 hour  Intake 240 ml  Output --  Net 240 ml   Filed Weights   07/17/19 1755  Weight: 54.8 kg    Data Reviewed: I have personally reviewed and interpreted daily labs, tele strips, imagings as discussed above. I reviewed all nursing notes, pharmacy notes, vitals, pertinent old records I have discussed plan of care as described above with RN and patient/family.  CBC: Recent Labs  Lab 07/22/19 0428 07/22/19 0428 07/23/19 0256 07/23/19 0256 07/24/19 0507 07/24/19 2130 07/25/19 0505 07/25/19 1139 07/26/19 0417  WBC 3.6*  --  3.8*  --  4.7  --  8.6  --  7.9  HGB 11.6*   < > 11.6*   < > 11.9* 14.4 14.2 12.6 11.5*  HCT 33.4*   < > 34.2*   < > 35.2* 44.2 41.8 37.1 34.6*  MCV 78.6*  --  80.3  --  80.4  --  80.5  --  81.2  PLT 124*  --  127*  --  139*  --  200  --  160   < > = values in this interval not displayed.   Basic Metabolic Panel: Recent Labs  Lab 07/22/19 0428 07/23/19 0256 07/24/19 0507 07/25/19 0505 07/26/19 0417  NA 136 135 137 136 137  K 3.2* 4.8 4.9 5.2* 3.7  CL 100 104 102 100 106  CO2 20* 20* 24 16* 21*  GLUCOSE 77 83 85 196* 144*  BUN 11 15 16  27* 22*  CREATININE 0.68 0.77 0.78 1.29* 0.86  CALCIUM 8.3* 8.4* 8.6* 9.1 8.1*  MG 1.8  --   --  2.3 2.0    Studies: DG Naso G Tube Plc W/Fl W/Rad  Result Date: 07/26/2019 CLINICAL DATA:  Feeding tube needed. EXAM: NASO G TUBE PLACEMENT WITH FL AND WITH RAD CONTRAST:  None. FLUOROSCOPY TIME:  Fluoroscopy Time:  1 minutes 12 seconds Radiation Exposure Index (if provided by the fluoroscopic device): 12.0 mGy COMPARISON:  Abdomen 07/23/2019. FINDINGS: Under fluoroscopic guidance a feeding tube was placed with tip placed in the duodenum. Tube was secured with tape. No complications. IMPRESSION: Successful feeding tube placement. Electronically Signed   By:  07/28/2019  Register   On: 07/26/2019 10:35    Scheduled Meds: . atorvastatin  20 mg Oral q1800  . enoxaparin (LOVENOX) injection  40 mg Subcutaneous Q24H  . feeding supplement (ENSURE ENLIVE)  237 mL Oral TID BM  . feeding supplement (OSMOLITE 1.5 CAL)  1,000 mL Per Tube Q24H  . feeding supplement (PRO-STAT SUGAR FREE 64)  30 mL Per Tube Daily  . metoCLOPramide  10 mg Oral TID AC  . mirtazapine  7.5 mg Oral QHS  . pantoprazole  40 mg Oral BID  . PARoxetine  20 mg Oral Daily  . polyethylene glycol  17 g Oral BID  . senna-docusate  1  tablet Oral BID   Continuous Infusions:  PRN Meds: acetaminophen **OR** acetaminophen, ondansetron **OR** ondansetron (ZOFRAN) IV  Darlin Priestly, MD [facility] 07/26/2019 4:24 PM  To reach On-call, see care teams to locate the attending and reach out to them via [email or web address]. If 7PM-7AM, please contact night-coverage If you still have difficulty reaching the attending provider, please page the [facility] (Director on Call) for [facility] on amion for assistance.

## 2019-07-26 NOTE — Progress Notes (Addendum)
Nutrition Follow-up  RD working remotely.  DOCUMENTATION CODES:   Severe malnutrition in context of chronic illness  INTERVENTION:  Addendum: Received call from kitchen that Osmolite 1.2 Cal is currently out of stock. Recalculated tube feed regimen with Osmolite 1.5 Cal.  Will monitor for plan of care. Recommend placement of post-pyloric feeding tube by diagnostic radiology under fluoroscopy. Patient is struggling with N/V and will likely tolerate post-pyloric tube feeding best.  If NGT is placed recommend Initiating Osmolite 1.5 Cal at 10 mL/hr and increasing by 10 mL/hr every 8 hours to goal rate of 40 mL/hr. Also provide Pro-Stat 30 mL once daily per tube. This regimen provides 1540 kcal, 75 grams of protein, 730 mL H2O daily.  If tube feeds are initiated provide a minimum free water flush of 30 mL Q4hrs to maintain tube patency. If plan is to try to meet fluid needs enterally recommend a free water flush of 120 mL Q4hrs. This would provide a total of 1450 mL H2O daily including the water in tube feeding.  If patient is started on tube feeds she will be at risk for refeeding syndrome. Recommend monitoring potassium, phosphorus, and magnesium daily for at least 3 days and replacing as needed.  Continue Ensure Enlive po TID, each supplement provides 350 kcal and 20 grams of protein.  NUTRITION DIAGNOSIS:   Severe Malnutrition related to chronic illness(intractable N/V) as evidenced by energy intake < or equal to 75% for > or equal to 1 month, 27.7% weight loss over 4 months, mild-moderate fat depletion, moderate-severe muscle depletion.  Ongoing.  GOAL:   Patient will meet greater than or equal to 90% of their needs  Not progressing.  MONITOR:   PO intake, Supplement acceptance, Labs, Weight trends, I & O's  REASON FOR ASSESSMENT:   Malnutrition Screening Tool    ASSESSMENT:   54 year old Caucasian female with a PMH of H. pylori gastritis 2015, T2DM, HTN, s/p  cholecystectomy admitted for intractable nausea and vomiting and significant weight loss x 3 months  Spoke with patient over the phone. She reports she was not able to eat much yesterday still. She is about to try to eat some breakfast now. She reports she was also not able to drink much of her Ensure yesterday either. According to chart NGT placement was attempted last night but was not successful.  Medications reviewed and include: Reglan 10 mg TID, Remeron 7.5 mg QHS, pantoprazole, Miralax, senna-docusate 1 tablet BID.  Labs reviewed: CBG 92-148, CO2 21, BUN 22.  Diet Order:   Diet Order            Diet regular Room service appropriate? Yes; Fluid consistency: Thin  Diet effective now             EDUCATION NEEDS:   Education needs have been addressed  Skin:  Skin Assessment: Reviewed RN Assessment  Last BM:  06/12/2019 per chart  Height:   Ht Readings from Last 1 Encounters:  07/17/19 5\' 1"  (1.549 m)   Weight:   Wt Readings from Last 1 Encounters:  07/17/19 54.8 kg   Ideal Body Weight:  47.7 kg  BMI:  Body mass index is 22.82 kg/m.  Estimated Nutritional Needs:   Kcal:  1500-1700kcal/day  Protein:  75-85g/day  Fluid:  >1.4L/day  09/14/19, MS, RD, LDN Pager number available on Amion

## 2019-07-27 LAB — CBC
HCT: 29.3 % — ABNORMAL LOW (ref 36.0–46.0)
Hemoglobin: 9.9 g/dL — ABNORMAL LOW (ref 12.0–15.0)
MCH: 27.3 pg (ref 26.0–34.0)
MCHC: 33.8 g/dL (ref 30.0–36.0)
MCV: 80.7 fL (ref 80.0–100.0)
Platelets: 130 10*3/uL — ABNORMAL LOW (ref 150–400)
RBC: 3.63 MIL/uL — ABNORMAL LOW (ref 3.87–5.11)
RDW: 18.2 % — ABNORMAL HIGH (ref 11.5–15.5)
WBC: 5.1 10*3/uL (ref 4.0–10.5)
nRBC: 0.6 % — ABNORMAL HIGH (ref 0.0–0.2)

## 2019-07-27 LAB — BASIC METABOLIC PANEL
Anion gap: 9 (ref 5–15)
BUN: 14 mg/dL (ref 6–20)
CO2: 20 mmol/L — ABNORMAL LOW (ref 22–32)
Calcium: 7.7 mg/dL — ABNORMAL LOW (ref 8.9–10.3)
Chloride: 107 mmol/L (ref 98–111)
Creatinine, Ser: 0.54 mg/dL (ref 0.44–1.00)
GFR calc Af Amer: >60 mL/min (ref >60)
GFR calc non Af Amer: >60 mL/min (ref >60)
Glucose, Bld: 127 mg/dL — ABNORMAL HIGH (ref 70–99)
Potassium: 3 mmol/L — ABNORMAL LOW (ref 3.5–5.1)
Sodium: 136 mmol/L (ref 135–145)

## 2019-07-27 LAB — GLUCOSE, CAPILLARY
Glucose-Capillary: 100 mg/dL — ABNORMAL HIGH (ref 70–99)
Glucose-Capillary: 128 mg/dL — ABNORMAL HIGH (ref 70–99)
Glucose-Capillary: 131 mg/dL — ABNORMAL HIGH (ref 70–99)
Glucose-Capillary: 131 mg/dL — ABNORMAL HIGH (ref 70–99)
Glucose-Capillary: 137 mg/dL — ABNORMAL HIGH (ref 70–99)
Glucose-Capillary: 141 mg/dL — ABNORMAL HIGH (ref 70–99)

## 2019-07-27 LAB — MAGNESIUM: Magnesium: 1.7 mg/dL (ref 1.7–2.4)

## 2019-07-27 LAB — PHOSPHORUS: Phosphorus: 2.2 mg/dL — ABNORMAL LOW (ref 2.5–4.6)

## 2019-07-27 MED ORDER — K PHOS MONO-SOD PHOS DI & MONO 155-852-130 MG PO TABS
500.0000 mg | ORAL_TABLET | Freq: Three times a day (TID) | ORAL | Status: AC
  Administered 2019-07-27: 500 mg via ORAL
  Filled 2019-07-27 (×3): qty 2

## 2019-07-27 NOTE — Progress Notes (Signed)
D: Pt alert and oriented x 4, however forgetfulness progress with the day. Pt takes meds whole with water slowly one by one with some difficulty. Pt as day progresses needs constant reminders.   A: Scheduled medications administered to pt, per MD orders. Support and encouragement provided. Frequent verbal contact made.   R: No adverse drug reactions noted. Pt complaint with medications and treatment plan. Pt interacts well with staff on the unit. Pt is stable at this time, will continue to monitor and provide care for as ordered.

## 2019-07-27 NOTE — Progress Notes (Signed)
Occupational Therapy Treatment Patient Details Name: Deanna Joseph MRN: [id number] DOB: 10/13/1965 Today's Date: 07/27/2019    History of present illness 54 yo female with onset of N&V throughout Jan was admitted for testing.  Currently with gastritis, low K+ and general weakness.  PMHx:  gastritis, DM, HTN, AKI   OT comments  Pt seen for OT tx this date to f/u re: safety with ADLs/ADL mobility. Pt with some vague dizziness throughout session, but BP maintained with positional changes (see precaution section) and RN notified of findings. OT facilitates pt participation in SPS with arm in arm to [facility] adjacent to bed with MIN/MOD A. Pt able to perform peri care with MIN A with sit/lateral lean technique. Pt requires setup for hand hygiene and t/f's back to bed with similar level of assist required. Pt completes bed mobility with CGA with HOB elevated. Pt left in bed with bed alarm on with [facility] greater than 30 degrees for feeding tube precautions and RN presenting to adjust feeding tube settings. Overall, anticipate that pt will be appropriate for HHOT with 24/7 supv as nausea resolves and mobilizing becomes less taxing.    Follow Up Recommendations  Home health OT;Supervision/Assistance - 24 hour    Equipment Recommendations       Recommendations for Other Services      Precautions / Restrictions Precautions Precautions: Fall Precaution Comments: limited mobility tolerance due to Nausea, watch HR and BP with mobility Restrictions Weight Bearing Restrictions: No Other Position/Activity Restrictions: BP 95/66 laying in bed, 96/63 in EOB sitting, 93/68 standing, 106/68 when returned to bed. HR 93 laying in bed, 112 in sitting, 127 in standing, 115 when returned to bed. RN notified of these VS       Mobility Bed Mobility Overal bed mobility: Needs Assistance Bed Mobility: Supine to Sit;Sit to Supine     Supine to sit: HOB elevated;Min guard Sit to supine: Min guard   General bed  mobility comments: some dizziness initially in EOB sitting, but resolves somewhat with ankle pumps. Pt endorses vague dizziness throughout session  Transfers Overall transfer level: Needs assistance Equipment used: 1 person hand held assist Transfers: Stand Pivot Transfers   Stand pivot transfers: Min assist;Mod assist       General transfer comment: Pt's BP checked throughout positional changes (see precaution section). Appears relatively stable. Reported to RN supine and sitting BP and she okays transfer trial to/from [facility].    Balance Overall balance assessment: Needs assistance Sitting-balance support: Feet supported;No upper extremity supported Sitting balance-Leahy Scale: Fair Sitting balance - Comments: G static sitting, F dynamic   Standing balance support: Bilateral upper extremity supported Standing balance-Leahy Scale: Fair Standing balance comment: arm in arm for static standing wtih MIN/MOD A                           ADL either performed or assessed with clinical judgement   ADL Overall ADL's : Needs assistance/impaired     Grooming: Wash/dry hands;Set up;Sitting                   Toilet Transfer: Minimal assistance;Moderate assistance;Stand-pivot;BSC Toilet Transfer Details (indicate cue type and reason): Pt requires arm in arm with gait belt for SPS to BSC. Some dizziness reported, somewhat subsides with ankle pumps in EOB sitting. Toileting- Clothing Manipulation and Hygiene: Minimal assistance;Sitting/lateral lean               Vision   Additional Comments:  improved tracking noted this date on assessment, pt reports decreased blurriness.   Perception     Praxis      Cognition Arousal/Alertness: Awake/alert Behavior During Therapy: WFL for tasks assessed/performed Overall Cognitive Status: No family/caregiver present to determine baseline cognitive functioning                                 General Comments: some  delayed processing noted, but pt is primarily appropriate with command following.        Exercises     Shoulder Instructions       General Comments      Pertinent Vitals/ Pain       Pain Assessment: Faces Faces Pain Scale: Hurts a little bit Pain Location: abdomen-vauge discomfort Pain Descriptors / Indicators: Discomfort Pain Intervention(s): Limited activity within patient's tolerance  Home Living                                          Prior Functioning/Environment              Frequency  Min 2X/week        Progress Toward Goals  OT Goals(current goals can now be found in the care plan section)  Progress towards OT goals: Progressing toward goals  Acute Rehab OT Goals Patient Stated Goal: be less dizzy, have less pain and go home OT Goal Formulation: With patient Time For Goal Achievement: 08/07/19 Potential to Achieve Goals: Good  Plan Discharge plan needs to be updated;Frequency remains appropriate    Co-evaluation                 AM-PAC OT "6 Clicks" Daily Activity     Outcome Measure   Help from another person eating meals?: A Little Help from another person taking care of personal grooming?: A Little Help from another person toileting, which includes using toliet, bedpan, or urinal?: A Little Help from another person bathing (including washing, rinsing, drying)?: A Lot Help from another person to put on and taking off regular upper body clothing?: A Little Help from another person to put on and taking off regular lower body clothing?: A Lot 6 Click Score: 16    End of Session Equipment Utilized During Treatment: Gait belt  OT Visit Diagnosis: Other abnormalities of gait and mobility (R26.89);History of falling (Z91.81);Muscle weakness (generalized) (M62.81);Other symptoms and signs involving cognitive function;Dizziness and giddiness (R42)   Activity Tolerance Other (comment)(dizziness)   Patient Left in bed;with  call bell/phone within reach;with bed alarm set;with nursing/sitter in room(with nurse present to hand fluid with tube feed.)   Nurse Communication Mobility status;Other (comment)(notified RN of VS with mobilization)        Time: 2585-2778 OT Time Calculation (min): 38 min  Charges: OT General Charges $OT Visit: 1 Visit OT Treatments $Self Care/Home Management : 23-37 mins $Therapeutic Activity: 8-22 mins  Gerrianne Scale, MS, OTR/L ascom [phone] 07/27/19, 1:09 PM

## 2019-07-27 NOTE — TOC Progression Note (Signed)
Transition of Care [facility]) - Progression Note    Patient Details  Name: Deanna Joseph MRN: [id number] Date of Birth: 12/08/1965  [facility]) CM/SW Contact  Myda Detwiler, Lemar Livings, LCSW Phone Number: 07/27/2019, 3:15 PM  Clinical Narrative:  [facility] reports unable to accept referral. Will see if another agency can take but due to pt being uninsured it will be difficult. Pt doing a little bit better today, still has NG tube. Pt reports he husband will assist at discharge and her main issue is weakness which she knows will take time to come back. See if still here on Monday     Expected Discharge Plan: Home/Self Care Barriers to Discharge: Inadequate or no insurance, Continued Medical Work up  Expected Discharge Plan and Services Expected Discharge Plan: Home/Self Care In-house Referral: Clinical Social Work     Living arrangements for the past 2 months: Single Family Home                                       Social Determinants of Health (SDOH) Interventions    Readmission Risk Interventions No flowsheet data found.

## 2019-07-27 NOTE — Plan of Care (Signed)

## 2019-07-27 NOTE — Progress Notes (Signed)
[facility] Progress Note  [name]    ZDG:[id number]  DOA: 07/17/2019     Date of Service: the patient was seen and examined on 07/27/2019  Chief Complaint  Patient presents with  . Emesis   Brief hospital course: Type II DM, HTN, unintentional weight loss, chronic gastritis and H. pylori. Presents with intractable nausea and vomiting and 30 pound weight loss in last 3 months. SP EGD 07/18/2019 which shows chronic gastritis.  Negative for metaplasia or dysplasia and malignancy. Currently further plan is monitor for tolerance of oral diet.  Assessment and Plan: # Nausea and reduced appetite likely due to severe constipation and stool impaction --Pt reported not having a real BM for at least 2 weeks.  CT a/p on presentation showed "moderate stool throughout the colon."   --high-dose Miralax and enema produced large BM's on 2/16 PLAN: --continue scheduled miralax BID  # Weight loss due to decreased PO intake # Failure to thrive --Pt has not had significant PO intake since presentation.  Albumin trended down to 3.3 from 4.2 on presentation. --Constipation resolved, but pt is slow to return to normal PO intake.  Discussed with pt that temp tube feed may help her regain strength and jump start her appetite. --Dobhoff tube placed on 2/18 PLAN: --continue tube feed  --Ad lib oral intake  # Reported bloody (or red) watery stools --Hgb remained stable and wnl.  Maybe due to strawberry [place] ice? PLAN: --continue to monitor for Hgb and BP --Avoid drinking anything red  Acute on chronic gastritis. History of H. pylori infection Elevated lipase Extensive work-up performed. EGD shows evidence of chronic gastritis without any acute abnormality. Patient was started on Reglan and bowel regimen so far no response or improvement. Continue PPI twice daily. Reglan dose increased on 07/22/2019. MRI brain was also performed to rule out any central process which is negative  for any acute abnormality.  2.  Acute kidney injury, resolved --likely pre-renal.  Improved with IVF.  Metabolic acidosis. Hypokalemia.  3.  Type 2 diabetes mellitus with hyperlipidemia. Diet controlled. Hemoglobin A1c 6.5. Continuing Lipitor. Continue sliding scale insulin.  4.  Left eye ptosis Bell's palsy Outpatient follow-up.  5.  Depression Continue Paxil. Continue Remeron (new)   Diet: Regular diet, tube feed DVT Prophylaxis: Subcutaneous Heparin    Advance goals of care discussion: Full code  Family Communication: not today  Disposition:  Pt is from home, admitted with nausea and vomiting.  Now that pt had bowel cleaned out, pt felt better and less nauseated, but is slow to return to normal PO intake.  Given the amount of time pt has not had proper nutrition, decision was made to start temp tube feed to help pt regain some strength and hopefully jump start her appetite.  Will do 2 days of tube feed and reassess the need for PEG tube placement.  Currently, PT rec SNF rehab.   Subjective:  Pt reported doing a little better.  Less nauseated, and less dizzy with being up to the bedside commode.  Was able to urinate on her own.  Was incontinent of stools.  No fever, chest pain, abdominal pain, N/V, dysuria.  Still very weak.  Was not able to increase tube feed up to goal rate yet, due to pt's intolerance, per nursing.   Physical Exam: Constitutional: NAD, AAOx3, appeared tired but calm HEENT: conjunctivae and lids normal, EOMI CV: RRR tachycardic, no M,R,G. Distal pulses +2.  No cyanosis.   RESP: CTA  B/L, normal respiratory effort  GI: +BS, NTND, soft Extremities: No effusions, edema, or tenderness in BLE SKIN: warm, dry and intact Neuro: II - XII grossly intact.  Sensation intact   Vitals:   07/26/19 1556 07/26/19 2342 07/27/19 0743 07/27/19 1559  BP: 120/71 128/74 109/73 109/72  Pulse: 99 97 99 99  Resp: 17 17 16 18   Temp: 97.6 F (36.4 C) 97.9 F (36.6  C) 97.9 F (36.6 C)   TempSrc: Oral     SpO2: 100% 100% 99% 100%  Weight:      Height:        Intake/Output Summary (Last 24 hours) at 07/27/2019 1844 Last data filed at 07/27/2019 1315 Gross per 24 hour  Intake 552.67 ml  Output 300 ml  Net 252.67 ml   Filed Weights   07/17/19 1755  Weight: 54.8 kg    Data Reviewed: I have personally reviewed and interpreted daily labs, tele strips, imagings as discussed above. I reviewed all nursing notes, pharmacy notes, vitals, pertinent old records I have discussed plan of care as described above with RN and patient/family.  CBC: Recent Labs  Lab 07/23/19 0256 07/23/19 0256 07/24/19 0507 07/24/19 0507 07/24/19 2130 07/25/19 0505 07/25/19 1139 07/26/19 0417 07/27/19 0500  WBC 3.8*  --  4.7  --   --  8.6  --  7.9 5.1  HGB 11.6*   < > 11.9*   < > 14.4 14.2 12.6 11.5* 9.9*  HCT 34.2*   < > 35.2*   < > 44.2 41.8 37.1 34.6* 29.3*  MCV 80.3  --  80.4  --   --  80.5  --  81.2 80.7  PLT 127*  --  139*  --   --  200  --  160 130*   < > = values in this interval not displayed.   Basic Metabolic Panel: Recent Labs  Lab 07/22/19 0428 07/22/19 0428 07/23/19 0256 07/24/19 0507 07/25/19 0505 07/26/19 0417 07/27/19 0500  NA 136   < > 135 137 136 137 136  K 3.2*   < > 4.8 4.9 5.2* 3.7 3.0*  CL 100   < > 104 102 100 106 107  CO2 20*   < > 20* 24 16* 21* 20*  GLUCOSE 77   < > 83 85 196* 144* 127*  BUN 11   < > 15 16 27* 22* 14  CREATININE 0.68   < > 0.77 0.78 1.29* 0.86 0.54  CALCIUM 8.3*   < > 8.4* 8.6* 9.1 8.1* 7.7*  MG 1.8  --   --   --  2.3 2.0 1.7  PHOS  --   --   --   --   --   --  2.2*   < > = values in this interval not displayed.    Studies: No results found.  Scheduled Meds: . atorvastatin  20 mg Oral q1800  . feeding supplement (ENSURE ENLIVE)  237 mL Oral TID BM  . feeding supplement (OSMOLITE 1.5 CAL)  1,000 mL Per Tube Q24H  . feeding supplement (PRO-STAT SUGAR FREE 64)  30 mL Per Tube Daily  . metoCLOPramide   10 mg Oral TID AC  . mirtazapine  7.5 mg Oral QHS  . pantoprazole  40 mg Oral BID  . PARoxetine  20 mg Oral Daily  . phosphorus  500 mg Oral TID  . polyethylene glycol  17 g Oral BID  . senna-docusate  1 tablet Oral BID   Continuous  Infusions:  PRN Meds: acetaminophen **OR** acetaminophen, ondansetron **OR** ondansetron (ZOFRAN) IV  Darlin Priestly, MD [facility] 07/27/2019 6:44 PM  To reach On-call, see care teams to locate the attending and reach out to them via [email or web address]. If 7PM-7AM, please contact night-coverage If you still have difficulty reaching the attending provider, please page the [facility] (Director on Call) for [facility] on amion for assistance.

## 2019-07-27 NOTE — Progress Notes (Signed)
Physical Therapy Treatment Patient Details Name: Deanna Joseph MRN: [id number] DOB: [birth year] Today's Date: 07/27/2019    History of Present Illness 54 yo female with onset of N&V throughout Jan was admitted for testing.  Currently with gastritis, low K+ and general weakness.  PMHx:  gastritis, DM, HTN, AKI. Pt now has feeding tube placed    PT Comments    Pt is making poor progress towards goals limited by severe weakness with one instance of buckling in B LEs this date while standing at [facility]. Increased number of incontinence episodes per RN with pt unaware of lying in soiled bed. Once on [facility], needs extensive cues for sequencing for hygiene with poor motor planning noted. Required +2 for ambulation back to bed with HR increasing to 129bpm with limited exertion. Discussed dc recommendations and pt agreeable to SNF for short term as she reports she has declined since hospital admission and is currently not at baseline level. Due to pt's weakness, she is at high risk for falls without constant physical assist with mobility efforts. Will continue to progress as able.    Follow Up Recommendations  SNF     Equipment Recommendations  Rolling walker with 5" wheels;3in1 (PT);Wheelchair (measurements PT);Wheelchair cushion (measurements PT)    Recommendations for Other Services       Precautions / Restrictions Precautions Precautions: Fall Precaution Comments: limited mobility tolerance due to Nausea, watch HR and BP with mobility Restrictions Weight Bearing Restrictions: No Other Position/Activity Restrictions: BP 95/66 laying in bed, 96/63 in EOB sitting, 93/68 standing, 106/68 when returned to bed. HR 93 laying in bed, 112 in sitting, 127 in standing, 115 when returned to bed. RN notified of these VS    Mobility  Bed Mobility Overal bed mobility: Needs Assistance Bed Mobility: Supine to Sit;Sit to Supine     Supine to sit: Min assist Sit to supine: Mod assist   General bed  mobility comments: needs heavy encouragement to participate and initiate movement. Once seated at EOB, able to demonstrate upright posture.  Transfers Overall transfer level: Needs assistance Equipment used: 1 person hand held assist Transfers: Sit to/from Stand Sit to Stand: Min assist Stand pivot transfers: Min assist;Mod assist       General transfer comment: transfers performed with HHA. As session progressed, pt became weaker and needed +2 assist for standing. 1 instance of bucling noted.  Ambulation/Gait Ambulation/Gait assistance: Min assist Gait Distance (Feet): 3 Feet Assistive device: 1 person hand held assist Gait Pattern/deviations: Step-to pattern     General Gait Details: needs cues for sequencing for stepping over to [facility]. Pt fatigues very quickly   Stairs             Wheelchair Mobility    Modified Rankin (Stroke Patients Only)       Balance Overall balance assessment: Needs assistance Sitting-balance support: Feet supported;No upper extremity supported Sitting balance-Leahy Scale: Fair Sitting balance - Comments: G static sitting, F dynamic   Standing balance support: Bilateral upper extremity supported Standing balance-Leahy Scale: Poor Standing balance comment: increased sway and buckling noted                            Cognition Arousal/Alertness: Awake/alert Behavior During Therapy: WFL for tasks assessed/performed Overall Cognitive Status: Impaired/Different from baseline  General Comments: delayed processing and problem solving      Exercises Other Exercises Other Exercises: upon getting OOB, pt with noted incontinent BM and soiled bed. Ambulated from bed->BSC with min assist with assist for positioning. Needed dep for set up and sequencing. She had difficulty with cognition and performing hygiene task without step by step instruction from therapist. +2 called for transfer  sit<>stand for brief donning. Bed linen changed by RN     General Comments        Pertinent Vitals/Pain Pain Assessment: No/denies pain Faces Pain Scale: Hurts a little bit Pain Location: abdomen-vauge discomfort Pain Descriptors / Indicators: Discomfort Pain Intervention(s): Limited activity within patient's tolerance    Home Living                      Prior Function            PT Goals (current goals can now be found in the care plan section) Acute Rehab PT Goals Patient Stated Goal: to go home PT Goal Formulation: With patient Time For Goal Achievement: 08/10/19 Potential to Achieve Goals: Good Progress towards PT goals: Progressing toward goals    Frequency    Min 2X/week      PT Plan Discharge plan needs to be updated    Co-evaluation              AM-PAC PT "6 Clicks" Mobility   Outcome Measure  Help needed turning from your back to your side while in a flat bed without using bedrails?: A Little Help needed moving from lying on your back to sitting on the side of a flat bed without using bedrails?: A Little Help needed moving to and from a bed to a chair (including a wheelchair)?: A Lot Help needed standing up from a chair using your arms (e.g., wheelchair or bedside chair)?: A Little Help needed to walk in hospital room?: A Lot Help needed climbing 3-5 steps with a railing? : Total 6 Click Score: 14    End of Session   Activity Tolerance: Patient limited by fatigue Patient left: in bed;with bed alarm set Nurse Communication: Mobility status;Other (comment) PT Visit Diagnosis: Unsteadiness on feet (R26.81);Muscle weakness (generalized) (M62.81);Difficulty in walking, not elsewhere classified (R26.2)     Time: 0737-1062 PT Time Calculation (min) (ACUTE ONLY): 43 min  Charges:  $Gait Training: 8-22 mins $Therapeutic Activity: 23-37 mins                     Greggory Stallion, [place], DPT [phone]    Deanna Joseph 07/27/2019, 4:36  PM

## 2019-07-28 LAB — GLUCOSE, CAPILLARY
Glucose-Capillary: 86 mg/dL (ref 70–99)
Glucose-Capillary: 91 mg/dL (ref 70–99)
Glucose-Capillary: 78 mg/dL (ref 70–99)
Glucose-Capillary: 73 mg/dL (ref 70–99)
Glucose-Capillary: 74 mg/dL (ref 70–99)
Glucose-Capillary: 163 mg/dL — ABNORMAL HIGH (ref 70–99)

## 2019-07-28 LAB — CBC
HCT: 30.6 % — ABNORMAL LOW (ref 36.0–46.0)
Hemoglobin: 10.4 g/dL — ABNORMAL LOW (ref 12.0–15.0)
MCH: 27.6 pg (ref 26.0–34.0)
MCHC: 34 g/dL (ref 30.0–36.0)
MCV: 81.2 fL (ref 80.0–100.0)
Platelets: 143 10*3/uL — ABNORMAL LOW (ref 150–400)
RBC: 3.77 MIL/uL — ABNORMAL LOW (ref 3.87–5.11)
RDW: 18.5 % — ABNORMAL HIGH (ref 11.5–15.5)
WBC: 3.4 10*3/uL — ABNORMAL LOW (ref 4.0–10.5)
nRBC: 0 % (ref 0.0–0.2)

## 2019-07-28 LAB — BASIC METABOLIC PANEL
Anion gap: 16 — ABNORMAL HIGH (ref 5–15)
BUN: 13 mg/dL (ref 6–20)
CO2: 21 mmol/L — ABNORMAL LOW (ref 22–32)
Calcium: 8 mg/dL — ABNORMAL LOW (ref 8.9–10.3)
Chloride: 100 mmol/L (ref 98–111)
Creatinine, Ser: 0.58 mg/dL (ref 0.44–1.00)
GFR calc Af Amer: >60 mL/min (ref >60)
GFR calc non Af Amer: >60 mL/min (ref >60)
Glucose, Bld: 99 mg/dL (ref 70–99)
Potassium: 2.7 mmol/L — CL (ref 3.5–5.1)
Sodium: 137 mmol/L (ref 135–145)

## 2019-07-28 LAB — ALBUMIN: Albumin: 2.5 g/dL — ABNORMAL LOW (ref 3.5–5.0)

## 2019-07-28 LAB — MAGNESIUM: Magnesium: 1.6 mg/dL — ABNORMAL LOW (ref 1.7–2.4)

## 2019-07-28 LAB — PHOSPHORUS: Phosphorus: 2.7 mg/dL (ref 2.5–4.6)

## 2019-07-28 MED ORDER — HALOPERIDOL LACTATE 5 MG/ML IJ SOLN
2.0000 mg | Freq: Once | INTRAMUSCULAR | Status: AC
  Administered 2019-07-28: 2 mg via INTRAMUSCULAR
  Filled 2019-07-28: qty 1

## 2019-07-28 MED ORDER — POTASSIUM CHLORIDE 10 MEQ/100ML IV SOLN
10.0000 meq | INTRAVENOUS | Status: AC
  Administered 2019-07-28 (×6): 10 meq via INTRAVENOUS
  Filled 2019-07-28 (×6): qty 100

## 2019-07-28 MED ORDER — MAGNESIUM SULFATE 2 GM/50ML IV SOLN
2.0000 g | Freq: Once | INTRAVENOUS | Status: AC
  Administered 2019-07-28: 2 g via INTRAVENOUS
  Filled 2019-07-28: qty 50

## 2019-07-28 MED ORDER — DEXTROSE 50 % IV SOLN
12.5000 g | Freq: Once | INTRAVENOUS | Status: AC
  Administered 2019-07-28: 22:00:00 12.5 g via INTRAVENOUS
  Filled 2019-07-28: qty 50

## 2019-07-28 MED ORDER — LORAZEPAM 2 MG/ML IJ SOLN
1.0000 mg | Freq: Once | INTRAMUSCULAR | Status: AC
  Administered 2019-07-28: 1 mg via INTRAVENOUS
  Filled 2019-07-28: qty 1

## 2019-07-28 NOTE — Progress Notes (Signed)
[facility] Progress Note  [name]    RWE:[id number]  DOA: 07/17/2019     Date of Service: the patient was seen and examined on 07/28/2019  Chief Complaint  Patient presents with  . Emesis   Brief hospital course: Type II DM, HTN, unintentional weight loss, chronic gastritis and H. pylori. Presents with intractable nausea and vomiting and 30 pound weight loss in last 3 months. SP EGD 07/18/2019 which shows chronic gastritis.  Negative for metaplasia or dysplasia and malignancy. Currently further plan is monitor for tolerance of oral diet.  Assessment and Plan: # Nausea and reduced appetite likely due to severe constipation and stool impaction --Pt reported not having a real BM for at least 2 weeks.  CT a/p on presentation showed "moderate stool throughout the colon."   --high-dose Miralax and enema produced large BM's on 2/16 PLAN: --continue scheduled miralax BID  # Weight loss due to decreased PO intake # Failure to thrive --Pt has not had significant PO intake since presentation.  Albumin trended down to 3.3 from 4.2 on presentation. --Constipation resolved, but pt is slow to return to normal PO intake.  Discussed with pt that temp tube feed may help her regain strength and jump start her appetite. --Dobhoff tube placed on 2/18, but pt pulled it off night of 2/19. PLAN: --Will hold off another Dobhoff tube placement for now, since husband reported pt taking in more food/drink.  # Reported bloody (or red) watery stools --Hgb remained stable and wnl.  Maybe due to strawberry [place] ice? PLAN: --continue to monitor for Hgb and BP --Avoid drinking anything red  Acute on chronic gastritis. History of H. pylori infection Elevated lipase Extensive work-up performed. EGD shows evidence of chronic gastritis without any acute abnormality. Patient was started on Reglan and bowel regimen so far no response or improvement. Continue PPI twice daily. Reglan dose  increased on 07/22/2019. MRI brain was also performed to rule out any central process which is negative for any acute abnormality.  2.  Acute kidney injury, resolved --likely pre-renal.  Improved with IVF.  Metabolic acidosis. Hypokalemia.  3.  Type 2 diabetes mellitus with hyperlipidemia. Diet controlled. Hemoglobin A1c 6.5. Continuing Lipitor. Continue sliding scale insulin.  4.  Left eye ptosis Bell's palsy Outpatient follow-up.  5.  Depression Continue Paxil. Continue Remeron (new)   Diet: Regular diet, tube feed DVT Prophylaxis: Subcutaneous Heparin    Advance goals of care discussion: Full code  Family Communication: husband updated at bedside  Disposition:  Pt is from home, admitted with nausea and vomiting.  Now that pt had bowel cleaned out, pt felt better and less nauseated, but is slow to return to normal PO intake.  Given the amount of time pt has not had proper nutrition, decision was made to start temp tube feed to help pt regain some strength and hopefully jump start her appetite.  Unfortunately, pt became delirious and pulled out her Dobhoff.  But pt seems to be eating/drinking more.  If pt continues to improve PO intake, will discharge to SNF rehab on Monday if bed available.   Subjective:  Pt was agitated and confused overnight and pulled out her Dobhoff tube.    This morning, pt didn't remember overnight events, was calm, however, was having visual hallucinations.  Husband reported pt was eating/drinking more for him.  No fever, dyspnea, chest pain, abdominal pain, N/V.   Physical Exam: Constitutional: NAD, AAOx3, appeared tired but calm HEENT: conjunctivae and lids normal,  EOMI CV: RRR tachycardic, no M,R,G. Distal pulses +2.  No cyanosis.   RESP: CTA B/L, normal respiratory effort  GI: +BS, NTND, soft Extremities: No effusions, edema, or tenderness in BLE SKIN: warm, dry and intact Neuro: II - XII grossly intact.  Sensation intact   Vitals:    07/27/19 2335 07/28/19 0126 07/28/19 0606 07/28/19 0735  BP: (!) 94/56 98/61  107/74  Pulse: (!) 101 89  81  Resp: 18 18  18   Temp: 98.1 F (36.7 C) 97.8 F (36.6 C)  (!) 97.5 F (36.4 C)  TempSrc:      SpO2: 100% 100%  97%  Weight:   60.1 kg   Height:        Intake/Output Summary (Last 24 hours) at 07/28/2019 1519 Last data filed at 07/28/2019 1300 Gross per 24 hour  Intake 684.56 ml  Output --  Net 684.56 ml   Filed Weights   07/17/19 1755 07/28/19 0606  Weight: 54.8 kg 60.1 kg    Data Reviewed: I have personally reviewed and interpreted daily labs, tele strips, imagings as discussed above. I reviewed all nursing notes, pharmacy notes, vitals, pertinent old records I have discussed plan of care as described above with RN and patient/family.  CBC: Recent Labs  Lab 07/24/19 0507 07/24/19 2130 07/25/19 0505 07/25/19 1139 07/26/19 0417 07/27/19 0500 07/28/19 0334  WBC 4.7  --  8.6  --  7.9 5.1 3.4*  HGB 11.9*   < > 14.2 12.6 11.5* 9.9* 10.4*  HCT 35.2*   < > 41.8 37.1 34.6* 29.3* 30.6*  MCV 80.4  --  80.5  --  81.2 80.7 81.2  PLT 139*  --  200  --  160 130* 143*   < > = values in this interval not displayed.   Basic Metabolic Panel: Recent Labs  Lab 07/22/19 0428 07/23/19 0256 07/24/19 0507 07/25/19 0505 07/26/19 0417 07/27/19 0500 07/28/19 0334  NA 136   < > 137 136 137 136 137  K 3.2*   < > 4.9 5.2* 3.7 3.0* 2.7*  CL 100   < > 102 100 106 107 100  CO2 20*   < > 24 16* 21* 20* 21*  GLUCOSE 77   < > 85 196* 144* 127* 99  BUN 11   < > 16 27* 22* 14 13  CREATININE 0.68   < > 0.78 1.29* 0.86 0.54 0.58  CALCIUM 8.3*   < > 8.6* 9.1 8.1* 7.7* 8.0*  MG 1.8  --   --  2.3 2.0 1.7 1.6*  PHOS  --   --   --   --   --  2.2* 2.7   < > = values in this interval not displayed.    Studies: No results found.  Scheduled Meds: . atorvastatin  20 mg Oral q1800  . feeding supplement (ENSURE ENLIVE)  237 mL Oral TID BM  . feeding supplement (OSMOLITE 1.5 CAL)  1,000  mL Per Tube Q24H  . feeding supplement (PRO-STAT SUGAR FREE 64)  30 mL Per Tube Daily  . metoCLOPramide  10 mg Oral TID AC  . mirtazapine  7.5 mg Oral QHS  . pantoprazole  40 mg Oral BID  . PARoxetine  20 mg Oral Daily  . polyethylene glycol  17 g Oral BID  . senna-docusate  1 tablet Oral BID   Continuous Infusions:  PRN Meds: acetaminophen **OR** acetaminophen, ondansetron **OR** ondansetron (ZOFRAN) IV  07/30/19, MD [facility] 07/28/2019 3:19 PM  To reach On-call, see care teams to locate the attending and reach out to them via [email or web address]. If 7PM-7AM, please contact night-coverage If you still have difficulty reaching the attending provider, please page the [facility] (Director on Call) for [facility] on amion for assistance.

## 2019-07-29 LAB — BASIC METABOLIC PANEL
Anion gap: 10 (ref 5–15)
BUN: 10 mg/dL (ref 6–20)
CO2: 24 mmol/L (ref 22–32)
Calcium: 8 mg/dL — ABNORMAL LOW (ref 8.9–10.3)
Chloride: 106 mmol/L (ref 98–111)
Creatinine, Ser: 0.58 mg/dL (ref 0.44–1.00)
GFR calc Af Amer: >60 mL/min (ref >60)
GFR calc non Af Amer: >60 mL/min (ref >60)
Glucose, Bld: 103 mg/dL — ABNORMAL HIGH (ref 70–99)
Potassium: 3.2 mmol/L — ABNORMAL LOW (ref 3.5–5.1)
Sodium: 140 mmol/L (ref 135–145)

## 2019-07-29 LAB — GLUCOSE, CAPILLARY
Glucose-Capillary: 102 mg/dL — ABNORMAL HIGH (ref 70–99)
Glucose-Capillary: 105 mg/dL — ABNORMAL HIGH (ref 70–99)
Glucose-Capillary: 106 mg/dL — ABNORMAL HIGH (ref 70–99)
Glucose-Capillary: 108 mg/dL — ABNORMAL HIGH (ref 70–99)
Glucose-Capillary: 134 mg/dL — ABNORMAL HIGH (ref 70–99)
Glucose-Capillary: 146 mg/dL — ABNORMAL HIGH (ref 70–99)
Glucose-Capillary: 93 mg/dL (ref 70–99)
Glucose-Capillary: 97 mg/dL (ref 70–99)
Glucose-Capillary: 97 mg/dL (ref 70–99)

## 2019-07-29 LAB — CBC
HCT: 28.3 % — ABNORMAL LOW (ref 36.0–46.0)
Hemoglobin: 9.5 g/dL — ABNORMAL LOW (ref 12.0–15.0)
MCH: 27.4 pg (ref 26.0–34.0)
MCHC: 33.6 g/dL (ref 30.0–36.0)
MCV: 81.6 fL (ref 80.0–100.0)
Platelets: 170 10*3/uL (ref 150–400)
RBC: 3.47 MIL/uL — ABNORMAL LOW (ref 3.87–5.11)
RDW: 19 % — ABNORMAL HIGH (ref 11.5–15.5)
WBC: 2.5 10*3/uL — ABNORMAL LOW (ref 4.0–10.5)
nRBC: 0.8 % — ABNORMAL HIGH (ref 0.0–0.2)

## 2019-07-29 LAB — PHOSPHORUS: Phosphorus: 3.7 mg/dL (ref 2.5–4.6)

## 2019-07-29 LAB — MAGNESIUM: Magnesium: 1.9 mg/dL (ref 1.7–2.4)

## 2019-07-29 MED ORDER — POTASSIUM CHLORIDE CRYS ER 20 MEQ PO TBCR
40.0000 meq | EXTENDED_RELEASE_TABLET | Freq: Once | ORAL | Status: AC
  Administered 2019-07-29: 40 meq via ORAL
  Filled 2019-07-29: qty 2

## 2019-07-29 NOTE — Progress Notes (Signed)
Physical Therapy Treatment Patient Details Name: ALONIA DIBUONO MRN: [id number] DOB: 10-04-1965 Today's Date: 07/29/2019    History of Present Illness 54 yo female with onset of N&V throughout Jan was admitted for testing.  Currently with gastritis, low K+ and general weakness.  PMHx:  gastritis, DM, HTN, AKI. Pt now has feeding tube placed    PT Comments    Pt is a pleasant 54 year old F who was admitted for above diagnosis. Pt demonstrating decline in ability to perform functional mobility this session demonstrating inability to complete STS transfer +2. Pt performs bed mobility with min/mod A requiring increased assistance with sit>supine when returning to bed, and transfers initiating STS with min/mod A +2 (with nurse tech) for safety, however then requiring total assist today to return to sitting edge of bed secondary to legs buckling during STS attempt. At start of session, pt instructed in supine therapeutic exercise, performing small-range bridges. BP re-assessed for response to activity and again once sitting, remaining within acceptable range (see objective measurements for details) throughout . Pt then assisted onto bed pan (for urgency) performing rolling with min A, though unable to void/have bowel movement. Pt then performs supine>sit requiring min A for trunk management and sequencing for cuing/setup, and requiring mod A when returning sit>supine for trunk and LE management.  Pt demonstrates deficits with strength and balance impairing functional mobility, requiring continued skilled PT intervention.    Follow Up Recommendations  SNF     Equipment Recommendations  Rolling walker with 5" wheels;3in1 (PT);Wheelchair (measurements PT);Wheelchair cushion (measurements PT)    Recommendations for Other Services       Precautions / Restrictions Precautions Precautions: Fall Precaution Comments: legs buckling with STS Restrictions Weight Bearing Restrictions: No Other  Position/Activity Restrictions: BP 105/73 lying in bed following brief supine ther ex; BP 101/63 once sitting; HR 107 lying in bed following brief supine TE; HR 108 once sitting edge of bed    Mobility  Bed Mobility Overal bed mobility: Needs Assistance Bed Mobility: Supine to Sit;Sit to Supine Rolling: Min assist   Supine to sit: Min assist(for trunk management; cuing for sequencing/setup) Sit to supine: Mod assist;+2 for physical assistance(for trunk and LEs)   General bed mobility comments: Pt requiring min A for rolling - assisting pt on bedpan as she initially thinks she is going to have a bowel movement imminently (though then unable to). Cuing provided for sequencing and setup during bed mobility.  Transfers Overall transfer level: Needs assistance Equipment used: Rolling walker (2 wheeled) Transfers: Sit to/from Stand Sit to Stand: +2 safety/equipment;Total assist(demonstrating legs buckling with STS attempt, requring +2 total assist to return to sitting edge of bed)         General transfer comment: Pt demos leg buckling with STS attempt, requiring +2 total assist back to sitting edge of bed.  Ambulation/Gait             General Gait Details: Gait deferred as pt's leg buckle with STS attempt   Stairs             Wheelchair Mobility    Modified Rankin (Stroke Patients Only)       Balance Overall balance assessment: Needs assistance Sitting-balance support: Feet supported;No upper extremity supported Sitting balance-Leahy Scale: Fair Sitting balance - Comments: Able to sit steady edge of bed without UE support   Standing balance support: Bilateral upper extremity supported Standing balance-Leahy Scale: Zero Standing balance comment: Pt demonstrating legs buckling with STS attempt, requiring total  assist +2 to return to sitting edge of bed                            Cognition Arousal/Alertness: Awake/alert Behavior During Therapy: WFL for  tasks assessed/performed Overall Cognitive Status: Within Functional Limits for tasks assessed                                        Exercises General Exercises - Lower Extremity Gluteal Sets: Both;10 reps;Strengthening(in hooklying, performing bridges within small range, 2x10reps)    General Comments        Pertinent Vitals/Pain Pain Assessment: 0-10 Pain Score: 6 (reports at start of session soreness in "butt and back") Pain Descriptors / Indicators: Sore Pain Intervention(s): Limited activity within patient's tolerance;Monitored during session    Home Living                      Prior Function            PT Goals (current goals can now be found in the care plan section) Acute Rehab PT Goals Patient Stated Goal: to go home PT Goal Formulation: With patient Time For Goal Achievement: 08/10/19 Potential to Achieve Goals: Fair Progress towards PT goals: Not progressing toward goals - comment    Frequency    Min 2X/week      PT Plan Current plan remains appropriate    Co-evaluation              AM-PAC PT "6 Clicks" Mobility   Outcome Measure  Help needed turning from your back to your side while in a flat bed without using bedrails?: A Little Help needed moving from lying on your back to sitting on the side of a flat bed without using bedrails?: A Little Help needed moving to and from a bed to a chair (including a wheelchair)?: Total Help needed standing up from a chair using your arms (e.g., wheelchair or bedside chair)?: Total Help needed to walk in hospital room?: Total Help needed climbing 3-5 steps with a railing? : Total 6 Click Score: 10    End of Session Equipment Utilized During Treatment: Gait belt Activity Tolerance: Patient limited by fatigue Patient left: in bed;with nursing/sitter in room Nurse Communication: Mobility status;Other (comment) PT Visit Diagnosis: Unsteadiness on feet (R26.81);Muscle weakness  (generalized) (M62.81);Difficulty in walking, not elsewhere classified (R26.2)     Time: 7619-5093 PT Time Calculation (min) (ACUTE ONLY): 40 min  Charges:  $Therapeutic Exercise: 8-22 mins $Therapeutic Activity: 23-37 mins                     Kendal Hymen, PT, DPT 07/29/19, 12:04 PM

## 2019-07-29 NOTE — Plan of Care (Signed)
  Problem: Education: Goal: Knowledge of General Education information will improve Description: Including pain rating scale, medication(s)/side effects and non-pharmacologic comfort measures Outcome: Progressing   Problem: Health Behavior/Discharge Planning: Goal: Ability to manage health-related needs will improve Outcome: Progressing   Problem: Clinical Measurements: Goal: Ability to maintain clinical measurements within normal limits will improve Outcome: Progressing Goal: Will remain free from infection Outcome: Progressing Goal: Diagnostic test results will improve Outcome: Progressing Goal: Respiratory complications will improve Outcome: Progressing Goal: Cardiovascular complication will be avoided Outcome: Progressing   Problem: Coping: Goal: Level of anxiety will decrease Outcome: Progressing   Problem: Pain Managment: Goal: General experience of comfort will improve Outcome: Progressing   Problem: Safety: Goal: Ability to remain free from injury will improve Outcome: Progressing   

## 2019-07-29 NOTE — Progress Notes (Addendum)
[facility] Progress Note  [name]    VPX:[id number]  DOA: 07/17/2019     Date of Service: the patient was seen and examined on 07/29/2019  Chief Complaint  Patient presents with  . Emesis   Brief hospital course: Type II DM, HTN, unintentional weight loss, chronic gastritis and H. pylori. Presents with intractable nausea and vomiting and 30 pound weight loss in last 3 months. SP EGD 07/18/2019 which shows chronic gastritis.  Negative for metaplasia or dysplasia and malignancy. Currently further plan is monitor for tolerance of oral diet.  Assessment and Plan: # Nausea and reduced appetite likely due to severe constipation and stool impaction, resolved --Pt reported not having a real BM for at least 2 weeks.  CT a/p on presentation showed "moderate stool throughout the colon."   --high-dose Miralax and enema produced large BM's on 2/16 PLAN: --continue scheduled miralax BID  # Weight loss due to decreased PO intake # Failure to thrive --Pt has not had significant PO intake since presentation.  Albumin trended down to 3.3 from 4.2 on presentation. --Constipation resolved, but pt is slow to return to normal PO intake.  Discussed with pt that temp tube feed may help her regain strength and jump start her appetite.  However, Dobhoff tube placed on 2/18, but pt pulled it off night of 2/19. PLAN: --Will hold off another Dobhoff tube placement for now, since husband reported pt taking in more food/drink.  # Reported bloody (or red) watery stools --Hgb remained stable and wnl.  Maybe due to strawberry [place] ice? PLAN: --continue to monitor for Hgb and BP --Avoid drinking anything red  Acute on chronic gastritis. History of H. pylori infection Elevated lipase Extensive work-up performed. EGD shows evidence of chronic gastritis without any acute abnormality. Patient was started on Reglan and bowel regimen so far no response or improvement. Continue PPI twice  daily. Reglan dose increased on 07/22/2019. MRI brain was also performed to rule out any central process which is negative for any acute abnormality.  2.  Acute kidney injury, resolved --likely pre-renal.  Improved with IVF.  Metabolic acidosis, resolved  Hypokalemia --Replete PRN  3.  Type 2 diabetes mellitus with hyperlipidemia. Diet controlled. Hemoglobin A1c 6.5. No need for fingersticks or SSI.  4.  Left eye ptosis Bell's palsy Outpatient follow-up.  5.  Depression Continue Paxil. Continue Remeron (new)   Diet: Regular diet DVT Prophylaxis: Subcutaneous Heparin    Advance goals of care discussion: Full code  Family Communication: not today  Disposition:  Pt is from home, admitted with nausea and vomiting.  Now that pt had bowel cleaned out, pt felt better and less nauseated, but is slow to return to normal PO intake.  Given the amount of time pt has not had proper nutrition, decision was made to start temp tube feed to help pt regain some strength and hopefully jump start her appetite.  Unfortunately, pt became delirious and pulled out her Dobhoff.  But pt seems to be eating/drinking more.  Currently pt is very weak and PT rec SNF rehab, however, pt has no insurance.  If pt continues to improve PO intake, will discharge home with husband.   Subjective:  Per husband's records, pt ate some spaghetti with meat sauce, green beans for dinner last night.  Per records, drank 2 Ensures.  Pt was less agitated today.  Pt offered no complaints.  No fever, dyspnea, chest pain, abdominal pain, N/V.  Good urine output.   Physical  Exam: Constitutional: NAD, AAOx3, appeared tired but calm HEENT: conjunctivae and lids normal, EOMI CV: RRR tachycardic, no M,R,G. Distal pulses +2.  No cyanosis.   RESP: CTA B/L, normal respiratory effort  GI: +BS, NTND, soft Extremities: No effusions, edema, or tenderness in BLE SKIN: warm, dry and intact Neuro: II - XII grossly intact.  Sensation  intact   Vitals:   07/29/19 0418 07/29/19 0815 07/29/19 1132 07/29/19 1135  BP: 120/69 104/71 105/73 92/67  Pulse: 95 96 (!) 107 (!) 101  Resp: 16 18    Temp:  97.7 F (36.5 C)    TempSrc:  Oral    SpO2: 98% 99%    Weight:      Height:        Intake/Output Summary (Last 24 hours) at 07/29/2019 1338 Last data filed at 07/29/2019 1300 Gross per 24 hour  Intake 580.11 ml  Output --  Net 580.11 ml   Filed Weights   07/17/19 1755 07/28/19 0606  Weight: 54.8 kg 60.1 kg    Data Reviewed: I have personally reviewed and interpreted daily labs, tele strips, imagings as discussed above. I reviewed all nursing notes, pharmacy notes, vitals, pertinent old records I have discussed plan of care as described above with RN and patient/family.  CBC: Recent Labs  Lab 07/25/19 0505 07/25/19 0505 07/25/19 1139 07/26/19 0417 07/27/19 0500 07/28/19 0334 07/29/19 0632  WBC 8.6  --   --  7.9 5.1 3.4* 2.5*  HGB 14.2   < > 12.6 11.5* 9.9* 10.4* 9.5*  HCT 41.8   < > 37.1 34.6* 29.3* 30.6* 28.3*  MCV 80.5  --   --  81.2 80.7 81.2 81.6  PLT 200  --   --  160 130* 143* 170   < > = values in this interval not displayed.   Basic Metabolic Panel: Recent Labs  Lab 07/25/19 0505 07/26/19 0417 07/27/19 0500 07/28/19 0334 07/29/19 0632  NA 136 137 136 137 140  K 5.2* 3.7 3.0* 2.7* 3.2*  CL 100 106 107 100 106  CO2 16* 21* 20* 21* 24  GLUCOSE 196* 144* 127* 99 103*  BUN 27* 22* 14 13 10   CREATININE 1.29* 0.86 0.54 0.58 0.58  CALCIUM 9.1 8.1* 7.7* 8.0* 8.0*  MG 2.3 2.0 1.7 1.6* 1.9  PHOS  --   --  2.2* 2.7 3.7    Studies: No results found.  Scheduled Meds: . atorvastatin  20 mg Oral q1800  . feeding supplement (ENSURE ENLIVE)  237 mL Oral TID BM  . feeding supplement (OSMOLITE 1.5 CAL)  1,000 mL Per Tube Q24H  . feeding supplement (PRO-STAT SUGAR FREE 64)  30 mL Per Tube Daily  . metoCLOPramide  10 mg Oral TID AC  . mirtazapine  7.5 mg Oral QHS  . pantoprazole  40 mg Oral BID  .  PARoxetine  20 mg Oral Daily  . polyethylene glycol  17 g Oral BID  . senna-docusate  1 tablet Oral BID   Continuous Infusions:  PRN Meds: acetaminophen **OR** acetaminophen, ondansetron **OR** ondansetron (ZOFRAN) IV  , MD [facility] 07/29/2019 1:38 PM  To reach On-call, see care teams to locate the attending and reach out to them via [email or web address]. If 7PM-7AM, please contact night-coverage If you still have difficulty reaching the attending provider, please page the [facility] (Director on Call) for [facility] on amion for assistance.

## 2019-07-29 NOTE — Progress Notes (Signed)
Patient's blood sugar 73. Refused to take anything PO. Received verbal order from Webb Silversmith NP for 12.5g dextrose 1x. Blood sugar at 163.

## 2019-07-29 NOTE — Plan of Care (Signed)

## 2019-07-30 LAB — BASIC METABOLIC PANEL
Anion gap: 9 (ref 5–15)
BUN: 12 mg/dL (ref 6–20)
CO2: 26 mmol/L (ref 22–32)
Calcium: 8.5 mg/dL — ABNORMAL LOW (ref 8.9–10.3)
Chloride: 107 mmol/L (ref 98–111)
Creatinine, Ser: 0.55 mg/dL (ref 0.44–1.00)
GFR calc Af Amer: >60 mL/min (ref >60)
GFR calc non Af Amer: >60 mL/min (ref >60)
Glucose, Bld: 108 mg/dL — ABNORMAL HIGH (ref 70–99)
Potassium: 4 mmol/L (ref 3.5–5.1)
Sodium: 142 mmol/L (ref 135–145)

## 2019-07-30 LAB — CBC
HCT: 31.4 % — ABNORMAL LOW (ref 36.0–46.0)
Hemoglobin: 10.6 g/dL — ABNORMAL LOW (ref 12.0–15.0)
MCH: 27.9 pg (ref 26.0–34.0)
MCHC: 33.8 g/dL (ref 30.0–36.0)
MCV: 82.6 fL (ref 80.0–100.0)
Platelets: 213 10*3/uL (ref 150–400)
RBC: 3.8 MIL/uL — ABNORMAL LOW (ref 3.87–5.11)
RDW: 19.4 % — ABNORMAL HIGH (ref 11.5–15.5)
WBC: 2.5 10*3/uL — ABNORMAL LOW (ref 4.0–10.5)
nRBC: 0.8 % — ABNORMAL HIGH (ref 0.0–0.2)

## 2019-07-30 LAB — GLUCOSE, CAPILLARY
Glucose-Capillary: 95 mg/dL (ref 70–99)
Glucose-Capillary: 95 mg/dL (ref 70–99)

## 2019-07-30 LAB — MAGNESIUM: Magnesium: 2 mg/dL (ref 1.7–2.4)

## 2019-07-30 LAB — PHOSPHORUS: Phosphorus: 3.4 mg/dL (ref 2.5–4.6)

## 2019-07-30 MED ORDER — ENSURE ENLIVE PO LIQD: 237.0000 mL | Freq: Three times a day (TID) | ORAL | Status: AC

## 2019-07-30 MED ORDER — POLYETHYLENE GLYCOL 3350 17 G PO PACK: 17.0000 g | PACK | Freq: Two times a day (BID) | ORAL | 0 refills | Status: DC

## 2019-07-30 NOTE — Discharge Summary (Addendum)
Physician Discharge Summary   Deanna Joseph  female DOB: March 08, 1966  CBU:[id number]  PCP: Hillery Aldo, MD  Admit date: 07/17/2019 Discharge date: 07/30/2019  Admitted From: home Disposition:  Home.  PT rec SNF rehab due to weakness, however, pt had no insurance.  Pt's husband is able to take care of pt at home.  Wheelchair provided with charity care.  CODE STATUS: Full code  Discharge Instructions    Diet - low sodium heart healthy   Complete by: As directed    Discharge instructions   Complete by: As directed    Try to drink at least 3 Ensures a day and increase your food intake gradually.  You are very weak, so please have your husband help you if you need to get up and about.  Make sure you have 1-2 BM's per day.  You need to take Miralax every day to prevent severe constipation.  Dr. Darlin Priestly - -   Increase activity slowly   Complete by: As directed        Hospital Course:  For full details, please see H&P, progress notes, consult notes and ancillary notes.  Briefly,  Deanna Joseph is a 54 y.o. Caucasian female with hx of Type II DM, HTN, chronic gastritis and H. Pylori who presented with intractable nausea and vomiting and 30 pound weight loss in last 3 months.  SP EGD 07/18/2019 which shows chronic gastritis.  Negative for metaplasia or dysplasia and malignancy. Currently further plan is monitor for tolerance of oral diet.  # Nausea and reduced appetite likely due to severe constipation and stool impaction, resolved Pt reported not having a real BM for at least 2 weeks.  CT a/p on presentation showed "moderate stool throughout the colon."  Pt was prescribed high-dose Miralax and enema which produced large BM's on 2/16.  Pt was then continued on scheduled miralax BID.  Pt and husband were stressed the importance of taking Miralax to keep stool soft and prevent constipation.  # Weight loss due to decreased PO intake # Severe weakness  # Failure to thrive Pt  had not had significant PO intake since presentation (or since much earlier than that).  Albumin trended down to 3.3 from 4.2 on presentation.  Poor PO intake was thought to be largely due to severe constipation and nausea (see above).    Pt's nausea resolved after the bowel clean-out, and pt started oral intake again.  Pt was doing better with taking in oral fluids and was able to maintain hydration and stable kidney function without MIVF.  Pt however was slow to pick up oral food intake, and remained extremely weak largely due to lack of nutrition and energy source.  Pt agreed for temporary tube feed to help her regain strength and jump start her appetite.  Dobhoff tube placed on 2/18, but pt pulled it off night of 2/19 due to development of hospital delirium.  Decision was made not to re-insert feeding tube since husband was reporting pt taking in more food/drink, and per charting, pt was drinking at least 2 Ensures per day for several days prior to discharge.  Husband will help pt eat more at home.  Pt's oral intake was generally improving, albeit slowly, so the expectation is that now that nausea and constipation have resolved, with time, pt will return to normal PO intake.  Severe Malnutrition related to poor PO intake as evidenced by energy intake < or equal to 75% for > or equal to  1 month, 27.7% weight loss over 4 months, mild-moderate fat depletion, moderate-severe muscle depletion.  Acute on chronic gastritis. History of H. pylori infection Elevated lipase Extensive work-up performed. EGD shows evidence of chronic gastritis without any acute abnormality. Continued PPI twice daily, and pt was started on Reglan.  MRI brain was also performed to rule out any central process which is negative for any acute abnormality.  Acute kidney injury, resolved Likely pre-renal.  Improved with IVF.  Metabolic acidosis, resolved  Hypokalemia Repleted PRN  Type 2 diabetes mellitus with  hyperlipidemia. Diet controlled. Hemoglobin A1c 6.5.  No need for fingersticks or SSI.  Left eye ptosis Bell's palsy Outpatient follow-up.  Depression Continued home Paxil.  Remeron was given during hospitalization but not continued after discharge.   Discharge Diagnoses:  Principal Problem:   Intractable vomiting with nausea Active Problems:   Unintentional weight loss of more than 10 pounds in 90 days   History of chronic gastritis   History of Helicobacter pylori infection   Diet-controlled diabetes mellitus (HCC)   AKI (acute kidney injury) (HCC)   Metabolic acidosis   Hypokalemia   Hypotension   Gastroparesis   Acute gastritis without hemorrhage   Weakness   Type 2 diabetes mellitus with hyperlipidemia (HCC)   Depression   Hypomagnesemia   Nausea   Ptosis, left eyelid   Protein-calorie malnutrition, severe    Discharge Instructions:  Allergies as of 07/30/2019      Reactions   Penicillins    Rash      Medication List    STOP taking these medications   traMADol 50 MG tablet Commonly known as: Ultram     TAKE these medications   aspirin EC 81 MG tablet Take 81 mg by mouth daily.   atorvastatin 20 MG tablet Commonly known as: LIPITOR Take 20 mg by mouth daily.   cetirizine 10 MG tablet Commonly known as: ZYRTEC Take 10 mg by mouth daily.   feeding supplement (ENSURE ENLIVE) Liqd Take 237 mLs by mouth 3 (three) times daily between meals.   gabapentin 300 MG capsule Commonly known as: NEURONTIN Take 300 mg by mouth at bedtime.   omeprazole 20 MG capsule Commonly known as: PRILOSEC Take 20 mg by mouth daily.   PARoxetine 40 MG tablet Commonly known as: PAXIL Take 40 mg by mouth daily.   polyethylene glycol 17 g packet Commonly known as: MIRALAX / GLYCOLAX Take 17 g by mouth 2 (two) times daily. Can decrease to once a day if having >2 BM's per day.       Follow-up Information    Hillery Aldo, MD Follow up in 5 day(s).   Specialty:  Family Medicine Contact information: [address] [place] [address] [phone]        Galen Manila, MD Follow up in 4 day(s).   Specialty: Ophthalmology Why: blurred vision left eye Contact information: [address] [place] [address] [phone]           Allergies  Allergen Reactions  . Penicillins     Rash      The results of significant diagnostics from this hospitalization (including imaging, microbiology, ancillary and laboratory) are listed below for reference.   Consultations:   Procedures/Studies: MR BRAIN W WO CONTRAST  Result Date: 07/21/2019 CLINICAL DATA:  Nausea/vomiting. Additional history provided: Persistent nausea/gastritis, rule out central causes, weakness, hyperlipidemia, hypokalemia, type 2 diabetes, ptosis left eyelid EXAM: MRI HEAD WITHOUT AND WITH CONTRAST TECHNIQUE: Multiplanar, multiecho pulse sequences of  the brain and surrounding structures were obtained without and with intravenous contrast. CONTRAST:  73mL GADAVIST GADOBUTROL 1 MMOL/ML IV SOLN COMPARISON:  Brain MRI 12/20/2006 FINDINGS: Brain: The examination is intermittently motion degraded. Most notably there is moderate motion degradation of the axial T2 FLAIR sequence and moderate/severe motion degradation of the coronal T1 weighted postcontrast sequence. There is no evidence of acute infarct. No evidence of intracranial mass. No midline shift or extra-axial fluid collection. No chronic intracranial blood products. Mild scattered and ill-defined T2/FLAIR hyperintensity within the cerebral white matter and pons has progressed from prior examination 12/20/2006. Findings are nonspecific, but may reflect chronic small vessel ischemic disease given known history of diabetes mellitus. Cerebral volume is normal for age. No abnormal intracranial enhancement. Vascular: Flow voids maintained within the proximal large arterial vessels. Skull and upper cervical spine:  Expected enhancement within the proximal large arterial vessels and dural venous sinuses. Sinuses/Orbits: Visualized orbits demonstrate no acute abnormality. Mild ethmoid sinus mucosal thickening. No significant mastoid effusion. IMPRESSION: Motion degraded examination. Most notably, there is moderate/severe motion degradation of the coronal T1 weighted postcontrast imaging. Within this limitation, there is no evidence of acute intracranial abnormality. Mild (but advanced for age) scattered and ill-defined T2 hyperintensity within the cerebral white matter and pons has progressed since MRI 12/20/2006. Findings are nonspecific but may reflect sequela of chronic small vessel ischemic disease given known history of diabetes mellitus. Electronically Signed   By: Kellie Simmering DO   On: 07/21/2019 17:06   CT ABDOMEN PELVIS W CONTRAST  Result Date: 07/17/2019 CLINICAL DATA:  54 year old female with nausea vomiting. EXAM: CT ABDOMEN AND PELVIS WITH CONTRAST TECHNIQUE: Multidetector CT imaging of the abdomen and pelvis was performed using the standard protocol following bolus administration of intravenous contrast. CONTRAST:  99mL OMNIPAQUE IOHEXOL 300 MG/ML  SOLN COMPARISON:  None. FINDINGS: Lower chest: There is a 4 mm right lower lobe nodule (series 4, image 3), possibly a partially calcified granuloma. The visualized lung bases are otherwise clear. No intra-abdominal free air or free fluid. Hepatobiliary: The liver is unremarkable. No intrahepatic biliary duct dilatation. Cholecystectomy. Pancreas: Unremarkable. No pancreatic ductal dilatation or surrounding inflammatory changes. Spleen: Normal in size without focal abnormality. Adrenals/Urinary Tract: The adrenal glands are unremarkable. The kidneys, visualized ureters, and urinary bladder appear unremarkable. Stomach/Bowel: There is moderate stool throughout the colon. There is no bowel obstruction or active inflammation. Normal appendix. Vascular/Lymphatic: Minimal  aortoiliac atherosclerotic disease. The IVC is unremarkable. No portal venous gas. There is no adenopathy. Reproductive: Hysterectomy.  No adnexal masses. Other: None Musculoskeletal: No acute or significant osseous findings. IMPRESSION: 1. No acute intra-abdominopelvic pathology. 2. Mild Aortic Atherosclerosis (ICD10-I70.0). Electronically Signed   By: Anner Crete M.D.   On: 07/17/2019 19:54   DG Abd Portable 1V  Result Date: 07/23/2019 CLINICAL DATA:  Constipation EXAM: PORTABLE ABDOMEN - 1 VIEW COMPARISON:  None. FINDINGS: Bowel gas pattern is unremarkable on this single view. There is overall mild stool burden. Cholecystectomy clips are noted. IMPRESSION: Mild stool burden. Electronically Signed   By: Macy Mis M.D.   On: 07/23/2019 08:25   DG Loyce Dys Tube Plc W/Fl W/Rad  Result Date: 07/26/2019 CLINICAL DATA:  Feeding tube needed. EXAM: NASO G TUBE PLACEMENT WITH FL AND WITH RAD CONTRAST:  None. FLUOROSCOPY TIME:  Fluoroscopy Time:  1 minutes 12 seconds Radiation Exposure Index (if provided by the fluoroscopic device): 12.0 mGy COMPARISON:  Abdomen 07/23/2019. FINDINGS: Under fluoroscopic guidance a feeding tube was placed with tip placed  in the duodenum. Tube was secured with tape. No complications. IMPRESSION: Successful feeding tube placement. Electronically Signed   By: Maisie Fus  Register   On: 07/26/2019 10:35   US Abdomen Limited RUQ  Result Date: 07/20/2019 CLINICAL DATA:  Inpatient. History of cholecystectomy, nausea and vomiting and right upper quadrant abdominal pain. Weight loss over 2 months. EXAM: ULTRASOUND ABDOMEN LIMITED RIGHT UPPER QUADRANT COMPARISON:  07/17/2019 CT abdomen/pelvis. FINDINGS: Gallbladder: No gallstones or wall thickening visualized. No sonographic Murphy sign noted by sonographer. Common bile duct: Diameter: 3 mm Liver: No focal lesion identified. Within normal limits in parenchymal echogenicity. Portal vein is patent on color Doppler imaging with normal  direction of blood flow towards the liver. Other: None. IMPRESSION: Normal post cholecystectomy right upper quadrant abdominal sonogram. No biliary ductal dilatation. Electronically Signed   By: Delbert Phenix M.D.   On: 07/20/2019 10:35      Labs: BNP (last 3 results) No results for input(s): BNP in the last 8760 hours. Basic Metabolic Panel: Recent Labs  Lab 07/26/19 0417 07/27/19 0500 07/28/19 0334 07/29/19 0632 07/30/19 0316  NA 137 136 137 140 142  K 3.7 3.0* 2.7* 3.2* 4.0  CL 106 107 100 106 107  CO2 21* 20* 21* 24 26  GLUCOSE 144* 127* 99 103* 108*  BUN 22* 14 13 10 12   CREATININE 0.86 0.54 0.58 0.58 0.55  CALCIUM 8.1* 7.7* 8.0* 8.0* 8.5*  MG 2.0 1.7 1.6* 1.9 2.0  PHOS  --  2.2* 2.7 3.7 3.4   Liver Function Tests: Recent Labs  Lab 07/25/19 0505 07/28/19 0334  AST 19  --   ALT 17  --   ALKPHOS 65  --   BILITOT 1.7*  --   PROT 6.6  --   ALBUMIN 3.3* 2.5*   No results for input(s): LIPASE, AMYLASE in the last 168 hours. No results for input(s): AMMONIA in the last 168 hours. CBC: Recent Labs  Lab 07/26/19 0417 07/27/19 0500 07/28/19 0334 07/29/19 0632 07/30/19 0316  WBC 7.9 5.1 3.4* 2.5* 2.5*  HGB 11.5* 9.9* 10.4* 9.5* 10.6*  HCT 34.6* 29.3* 30.6* 28.3* 31.4*  MCV 81.2 80.7 81.2 81.6 82.6  PLT 160 130* 143* 170 213   Cardiac Enzymes: No results for input(s): CKTOTAL, CKMB, CKMBINDEX, TROPONINI in the last 168 hours. BNP: Invalid input(s): POCBNP CBG: Recent Labs  Lab 07/29/19 1202 07/29/19 1647 07/29/19 1959 07/29/19 2343 07/30/19 0332  GLUCAP 134* 102* 97 108* 95   D-Dimer No results for input(s): DDIMER in the last 72 hours. Hgb A1c No results for input(s): HGBA1C in the last 72 hours. Lipid Profile No results for input(s): CHOL, HDL, LDLCALC, TRIG, CHOLHDL, LDLDIRECT in the last 72 hours. Thyroid function studies No results for input(s): TSH, T4TOTAL, T3FREE, THYROIDAB in the last 72 hours.  Invalid input(s): FREET3 Anemia work  up No results for input(s): VITAMINB12, FOLATE, FERRITIN, TIBC, IRON, RETICCTPCT in the last 72 hours. Urinalysis    Component Value Date/Time   COLORURINE YELLOW (A) 07/17/2019 1809   APPEARANCEUR CLEAR (A) 07/17/2019 1809   LABSPEC 1.030 07/17/2019 1809   PHURINE 6.0 07/17/2019 1809   GLUCOSEU NEGATIVE 07/17/2019 1809   HGBUR NEGATIVE 07/17/2019 1809   BILIRUBINUR NEGATIVE 07/17/2019 1809   KETONESUR 20 (A) 07/17/2019 1809   PROTEINUR NEGATIVE 07/17/2019 1809   NITRITE NEGATIVE 07/17/2019 1809   LEUKOCYTESUR TRACE (A) 07/17/2019 1809   Sepsis Labs Invalid input(s): PROCALCITONIN,  WBC,  LACTICIDVEN Microbiology No results found for this or any previous  visit (from the past 240 hour(s)).   Total time spend on discharging this patient, including the last patient exam, discussing the hospital stay, instructions for ongoing care as it relates to all pertinent caregivers, as well as preparing the medical discharge records, prescriptions, and/or referrals as applicable, is 40 minutes.    Darlin Priestly, MD  [facility] 07/30/2019, 9:49 AM  If 7PM-7AM, please contact night-coverage

## 2019-07-30 NOTE — Progress Notes (Cosign Needed)
           Patient suffers from gastritis and debility   which impairs their ability to perform daily activities like ADL's and tolieting   in the home.  A rolling walker  will not resolve issue with performing activities of daily living.  A wheelchair will allow patient to safely perform daily activities.  Patient is not able to propel themselves in the home using a standard weight wheelchair due to  Endurance and fatigue .  Patient can self propel in the lightweight wheelchair. She will need this for 6-9 months.

## 2019-07-30 NOTE — TOC Transition Note (Signed)
Transition of Care [facility]) - CM/SW Discharge Note   Patient Details  Name: Deanna Joseph MRN: [id number] Date of Birth: [birth year]  [facility]) CM/SW Contact:  Elease Hashimoto, LCSW Phone Number: 07/30/2019, 1:26 PM   Clinical Narrative:   Met with pt and husband to discuss wheelchair to be delivered to their home and will contact husband regarding this. Pt hopes with being at home she will get stronger, but husband aware will need to physically assist her at discharge. He is prepared to provide this level of care. Have gotten [facility] to see pt for a few visits to make sure doing well at home. Pt feels she needs to be moving and will do this once home and with husband's help. Pt has rollator at home and will use this. Both feel ready for DC today.    Final next level of care: [place] Barriers to Discharge: Barriers Resolved   Patient Goals and CMS Choice Patient states their goals for this hospitalization and ongoing recovery are:: I want them to figure out what is wrong with me and then get better.      Discharge Placement                Patient to be transferred to facility by: Home via husband Name of family member notified: Husband Patient and family notified of of transfer: 07/30/19  Discharge Plan and Services In-house Referral: Clinical Social Work              DME Arranged: Youth worker wheelchair with seat cushion DME Agency: [employer] Date DME Agency Contacted: 07/30/19 Time DME Agency Contacted: 25 Representative spoke with at DME Agency: [place]oy [place]: PT [place]: [facility] ([facility] [employer]) Date [place]: 07/30/19 Time [place]: 1326 Representative spoke with at [place]: [place] ([place]) Interventions     Readmission Risk Interventions No flowsheet data found.

## 2019-07-30 NOTE — Progress Notes (Signed)
D: Pt alert and oriented x4. Pt does experience forgetfulness at times.   A: Pt and spouse received discharge and medication education/information. Pt belongings were gathered and taken with.   R: Pt and spouse verbalized understanding of discharge and medication education/information.  Pt escorted to medical mall front lobby via wheelchair by staff where spouse picked her up.

## 2019-07-30 NOTE — TOC Progression Note (Addendum)
Transition of Care [facility]) - Progression Note    Patient Details  Name: Deanna Joseph MRN: [id number] Date of Birth: 28-Jul-1965  Transition of Care [facility]) CM/SW Contact  Islam Eichinger, Lemar Livings, LCSW Phone Number: 07/30/2019, 4:02 PM  Clinical Narrative:  Sherron Monday with [place]-RN who reports pt is not safe going home in a car she requires too mch assist. Husband not happy with this idea but will contact EMS to have pt transported home. Have contacted and packet in chart. Made bedside RN aware of this. Will prepare pt for DC.  4:08 pm Husband feels he has plenty of help once pt gets home to get her into the house. He does not want EMS if can be helped. RN aware and this worker has called and cancelled EMS and if plan changes will leave packet in chart in case the plan changes again.    Expected Discharge Plan: Home/Self Care Barriers to Discharge: Barriers Resolved  Expected Discharge Plan and Services Expected Discharge Plan: Home/Self Care In-house Referral: Clinical Social Work     Living arrangements for the past 2 months: Single Family Home Expected Discharge Date: 07/30/19               DME Arranged: Community education officer wheelchair with seat cushion DME Agency: [employer] Date DME Agency Contacted: 07/30/19 Time DME Agency Contacted: 1325 Representative spoke with at DME Agency: Nida Boatman HH Arranged: PT HH Agency: [facility] ([facility] [employer]) Date HH Agency Contacted: 07/30/19 Time HH Agency Contacted: 1326 Representative spoke with at [facility] Agency: Rosey Bath   Social Determinants of Health (SDOH) Interventions    Readmission Risk Interventions No flowsheet data found.

## 2019-07-31 LAB — GLUCOSE, CAPILLARY: Glucose-Capillary: 91 mg/dL (ref 70–99)

## 2019-08-01 NOTE — Progress Notes (Signed)
Pt needs an standard weight wheelchair with foot rests and brake extenders. She has endurance and fatigue issues and is only taking a few steps with therapy.

## 2019-08-01 NOTE — Progress Notes (Signed)
PT Contact Note   Patient suffers from generalized weakness due to debility which impairs his/her ability to perform daily activities like toileting, feeding, dressing, grooming, bathing in the home. A cane, walker, crutch will not resolve the patient's issue with performing activities of daily living. A standard wheelchair is required/recommended and will allow patient to safely perform daily activities.   Patient can safely propel the wheelchair in the home or has a caregiver who can provide assistance.    Deanna Joseph H. Manson Passey, PT, DPT, NCS 08/01/19, 9:47 AM [phone]

## 2019-08-03 MED ORDER — ENOXAPARIN SODIUM 40 MG/0.4ML ~~LOC~~ SOLN
40.00 | SUBCUTANEOUS | Status: DC
Start: 2019-08-03 — End: 2019-08-03

## 2019-08-03 MED ORDER — ATORVASTATIN CALCIUM 20 MG PO TABS
20.00 | ORAL_TABLET | ORAL | Status: DC
Start: 2019-08-04 — End: 2019-08-03

## 2019-08-03 MED ORDER — MIRTAZAPINE 15 MG PO TABS
7.50 | ORAL_TABLET | ORAL | Status: DC
Start: 2019-08-03 — End: 2019-08-03

## 2019-08-03 MED ORDER — DIPHENHYDRAMINE HCL 25 MG PO CAPS
25.00 | ORAL_CAPSULE | ORAL | Status: DC
Start: ? — End: 2019-08-03

## 2019-08-03 MED ORDER — GABAPENTIN 300 MG PO CAPS
300.00 | ORAL_CAPSULE | ORAL | Status: DC
Start: 2019-08-03 — End: 2019-08-03

## 2019-08-03 MED ORDER — PAROXETINE HCL 30 MG PO TABS
30.00 | ORAL_TABLET | ORAL | Status: DC
Start: 2019-08-04 — End: 2019-08-03

## 2019-08-03 MED ORDER — MAGNESIUM OXIDE 400 MG PO TABS
800.00 | ORAL_TABLET | ORAL | Status: DC
Start: 2019-08-03 — End: 2019-08-03

## 2019-08-03 MED ORDER — NITROFURANTOIN MONOHYD MACRO 100 MG PO CAPS
100.00 | ORAL_CAPSULE | ORAL | Status: DC
Start: 2019-08-03 — End: 2019-08-03

## 2019-08-03 MED ORDER — PANTOPRAZOLE SODIUM 40 MG PO TBEC
40.00 | DELAYED_RELEASE_TABLET | ORAL | Status: DC
Start: 2019-08-04 — End: 2019-08-03

## 2019-08-03 MED ORDER — MELATONIN 3 MG PO TABS
3.00 | ORAL_TABLET | ORAL | Status: DC
Start: 2019-08-03 — End: 2019-08-03

## 2019-08-03 MED ORDER — CETIRIZINE HCL 10 MG PO TABS
10.00 | ORAL_TABLET | ORAL | Status: DC
Start: 2019-08-04 — End: 2019-08-03

## 2019-08-03 MED ORDER — POLYETHYLENE GLYCOL 3350 17 GM/SCOOP PO POWD
17.00 | ORAL | Status: DC
Start: 2019-08-04 — End: 2019-08-03

## 2019-08-07 DIAGNOSIS — F04 Amnestic disorder due to known physiological condition: Secondary | ICD-10-CM | POA: Insufficient documentation

## 2022-02-02 IMAGING — RF DG NASO G TUBE PLC W/FL W/RAD
3 series · 6 of 6 positions shown · IV contrast (agent unspecified)
Comparison: Abdomen 07/23/2019.

CLINICAL DATA: Feeding tube needed.

EXAM:
NASO G TUBE PLACEMENT WITH FL AND WITH RAD
CONTRAST:  None.
FLUOROSCOPY TIME:  Fluoroscopy Time:  1 minutes 12 seconds
Radiation Exposure Index (if provided by the fluoroscopic device):
12.0 mGy

[Series 1: fluoro_iodine 2fps_bw · 0.17mm/px · 1 of 1 slices shown (1 of 2)]
[im 1/1]
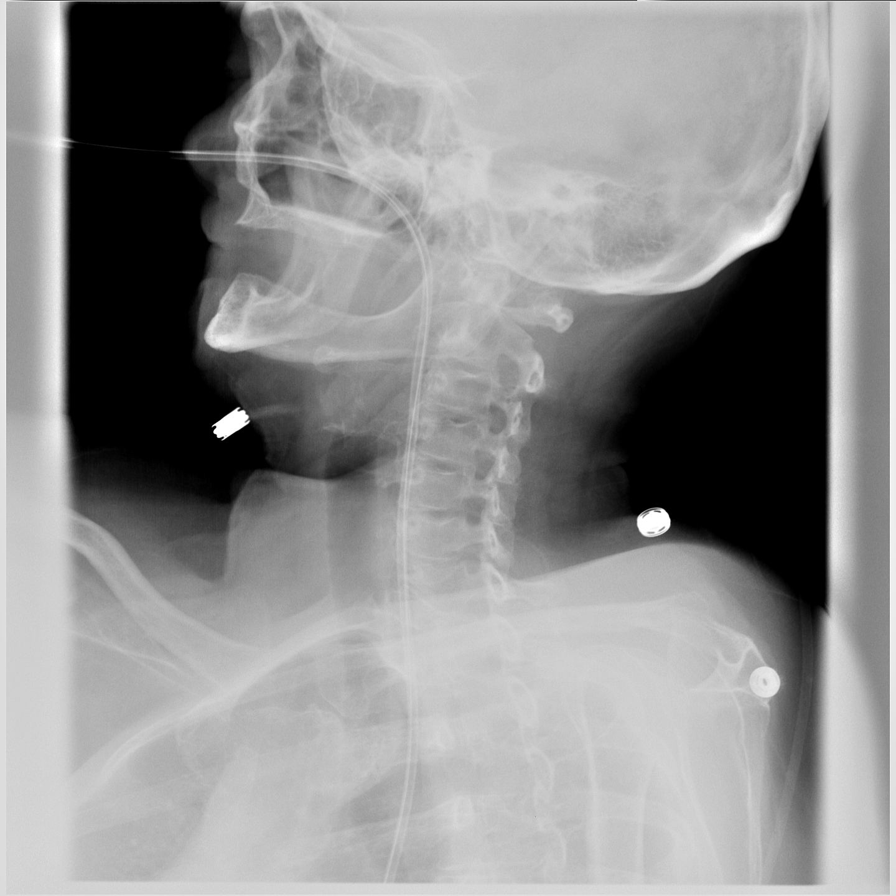

[Series 2: fluoro_iodine 2fps_bw · 0.17mm/px · 1 of 1 slices shown (2 of 2)]
[im 1/1]
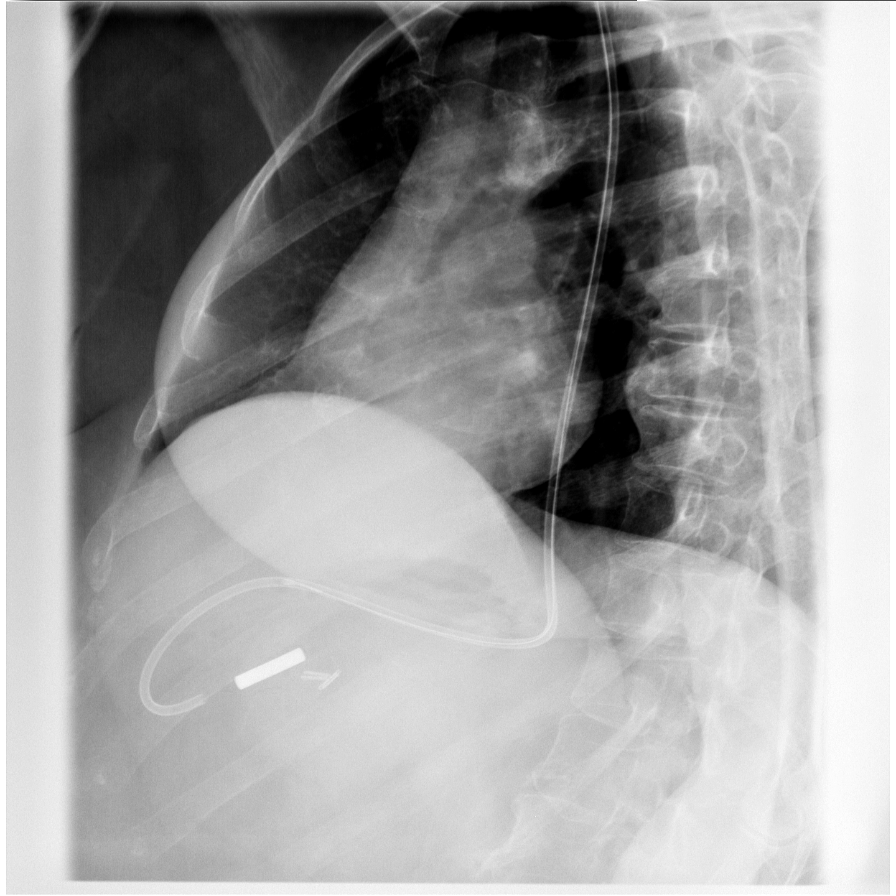

[Series 3: cp_standard · 0.26mm/px · 4 of 10 frames shown]
[frame 2/10]
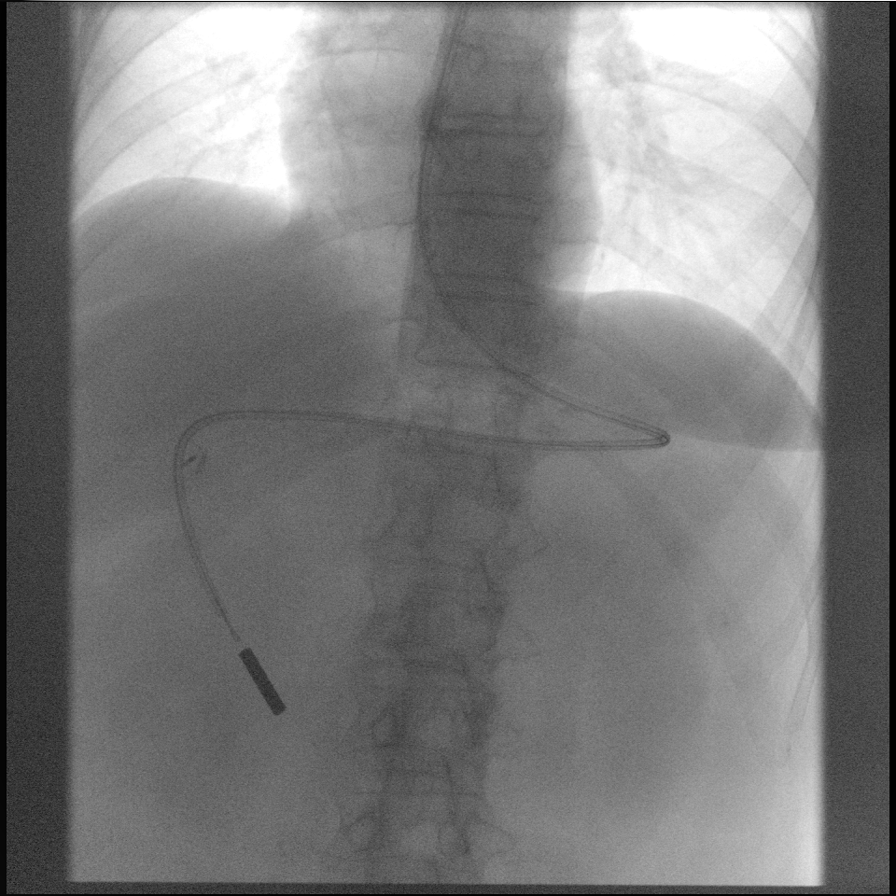
[frame 4/10]
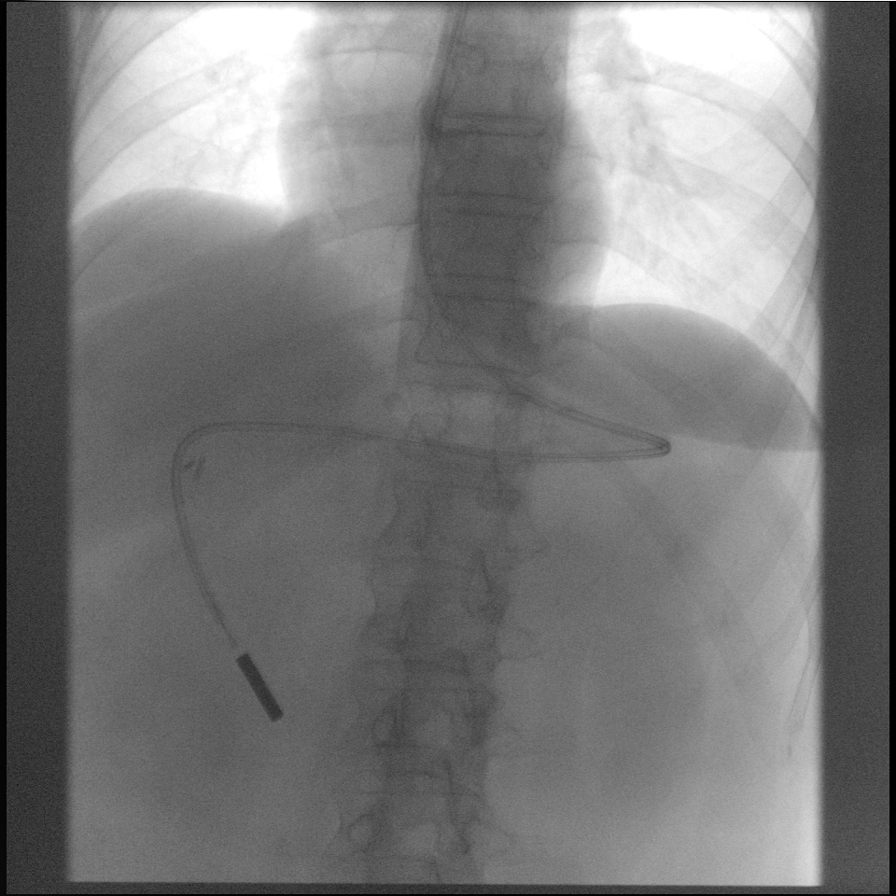
[frame 6/10]
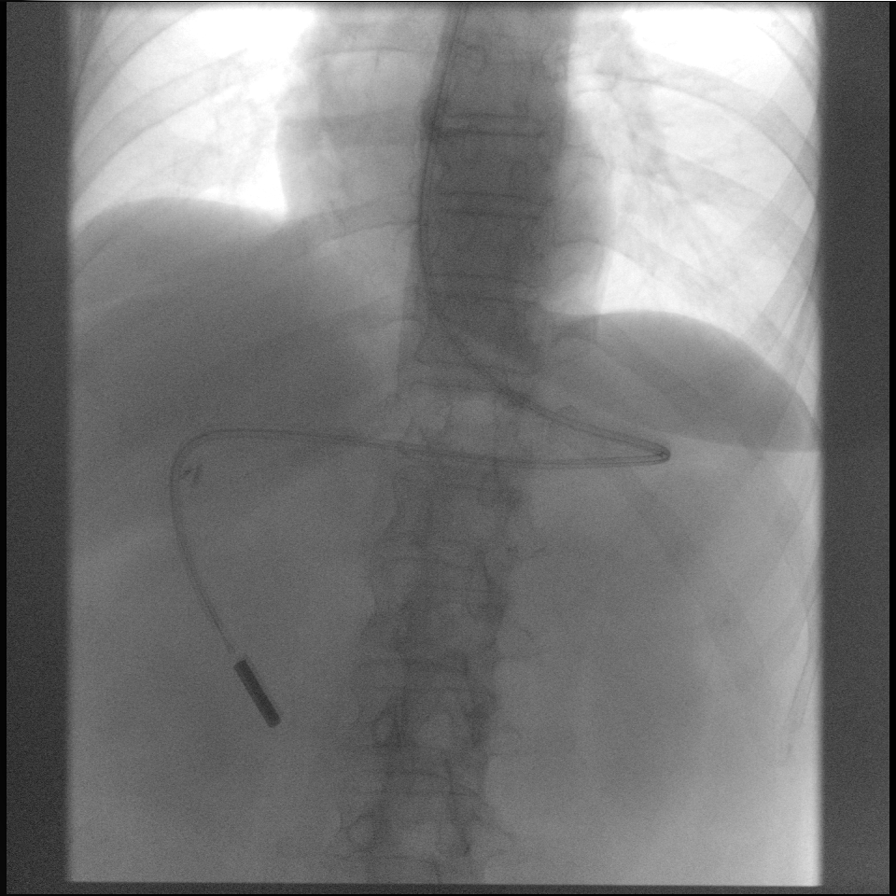
[frame 9/10]
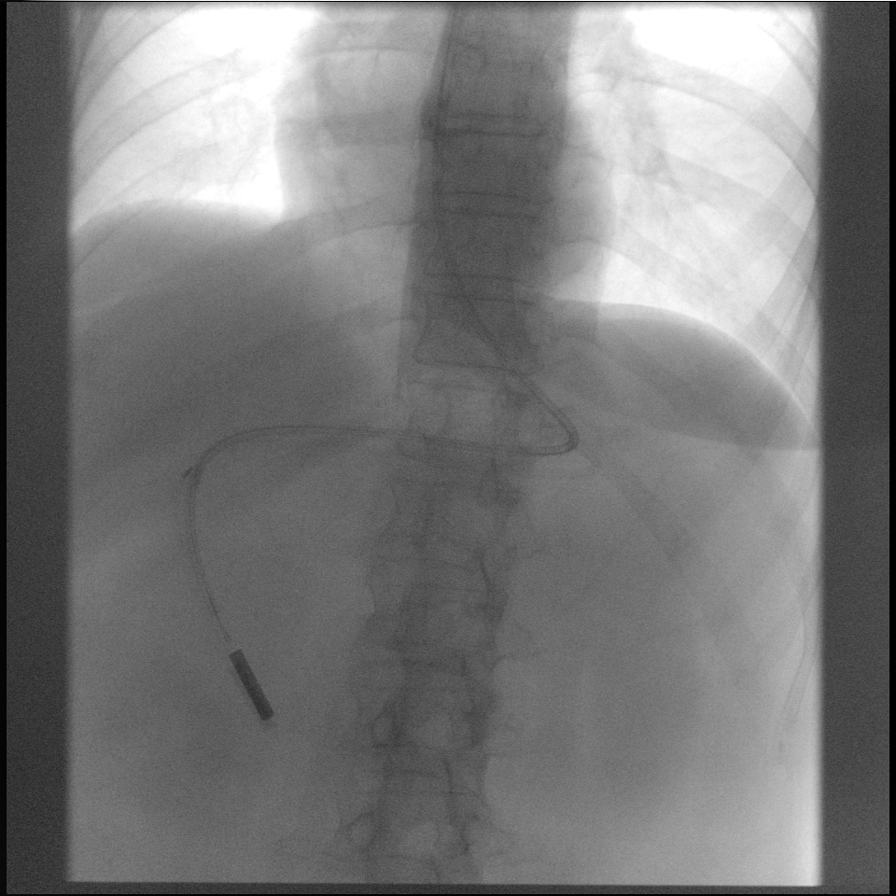

[6 of 6 positions shown; findings below may reference images not displayed]

FINDINGS: Under fluoroscopic guidance a feeding tube was placed with tip
placed in the duodenum. Tube was secured with tape. No
complications.
IMPRESSION: Successful feeding tube placement.

## 2022-09-30 ENCOUNTER — Emergency Department: Payer: Medicaid Other

## 2022-09-30 ENCOUNTER — Encounter: Payer: Self-pay | Admitting: Emergency Medicine

## 2022-09-30 ENCOUNTER — Other Ambulatory Visit: Payer: Self-pay

## 2022-09-30 ENCOUNTER — Observation Stay: Payer: Medicaid Other

## 2022-09-30 ENCOUNTER — Inpatient Hospital Stay
Admission: EM | Admit: 2022-09-30 | Discharge: 2022-10-07 | DRG: 312 | Disposition: A | Discharge status: Home Health | Payer: Medicaid Other | Attending: Internal Medicine | Admitting: Internal Medicine

## 2022-09-30 DIAGNOSIS — I951 Orthostatic hypotension: Principal | ICD-10-CM | POA: Diagnosis present

## 2022-09-30 DIAGNOSIS — R4781 Slurred speech: Secondary | ICD-10-CM | POA: Diagnosis present

## 2022-09-30 DIAGNOSIS — E512 Wernicke's encephalopathy: Secondary | ICD-10-CM | POA: Diagnosis not present

## 2022-09-30 DIAGNOSIS — R4189 Other symptoms and signs involving cognitive functions and awareness: Secondary | ICD-10-CM | POA: Diagnosis not present

## 2022-09-30 DIAGNOSIS — B961 Klebsiella pneumoniae [K. pneumoniae] as the cause of diseases classified elsewhere: Secondary | ICD-10-CM | POA: Diagnosis present

## 2022-09-30 DIAGNOSIS — N179 Acute kidney failure, unspecified: Secondary | ICD-10-CM | POA: Diagnosis not present

## 2022-09-30 DIAGNOSIS — R1111 Vomiting without nausea: Secondary | ICD-10-CM | POA: Diagnosis not present

## 2022-09-30 DIAGNOSIS — S0990XA Unspecified injury of head, initial encounter: Secondary | ICD-10-CM | POA: Diagnosis not present

## 2022-09-30 DIAGNOSIS — R32 Unspecified urinary incontinence: Secondary | ICD-10-CM | POA: Diagnosis present

## 2022-09-30 DIAGNOSIS — E1169 Type 2 diabetes mellitus with other specified complication: Secondary | ICD-10-CM | POA: Diagnosis not present

## 2022-09-30 DIAGNOSIS — R200 Anesthesia of skin: Secondary | ICD-10-CM | POA: Diagnosis present

## 2022-09-30 DIAGNOSIS — S7001XA Contusion of right hip, initial encounter: Secondary | ICD-10-CM | POA: Diagnosis present

## 2022-09-30 DIAGNOSIS — E785 Hyperlipidemia, unspecified: Secondary | ICD-10-CM | POA: Diagnosis present

## 2022-09-30 DIAGNOSIS — N1831 Chronic kidney disease, stage 3a: Secondary | ICD-10-CM | POA: Diagnosis present

## 2022-09-30 DIAGNOSIS — S42202A Unspecified fracture of upper end of left humerus, initial encounter for closed fracture: Secondary | ICD-10-CM | POA: Diagnosis present

## 2022-09-30 DIAGNOSIS — Z043 Encounter for examination and observation following other accident: Secondary | ICD-10-CM | POA: Diagnosis not present

## 2022-09-30 DIAGNOSIS — R55 Syncope and collapse: Secondary | ICD-10-CM | POA: Diagnosis not present

## 2022-09-30 DIAGNOSIS — S42215A Unspecified nondisplaced fracture of surgical neck of left humerus, initial encounter for closed fracture: Secondary | ICD-10-CM

## 2022-09-30 DIAGNOSIS — R9431 Abnormal electrocardiogram [ECG] [EKG]: Secondary | ICD-10-CM | POA: Diagnosis not present

## 2022-09-30 DIAGNOSIS — S42309A Unspecified fracture of shaft of humerus, unspecified arm, initial encounter for closed fracture: Secondary | ICD-10-CM | POA: Insufficient documentation

## 2022-09-30 DIAGNOSIS — R296 Repeated falls: Secondary | ICD-10-CM | POA: Diagnosis present

## 2022-09-30 DIAGNOSIS — R4182 Altered mental status, unspecified: Secondary | ICD-10-CM | POA: Diagnosis not present

## 2022-09-30 DIAGNOSIS — Z803 Family history of malignant neoplasm of breast: Secondary | ICD-10-CM

## 2022-09-30 DIAGNOSIS — E1122 Type 2 diabetes mellitus with diabetic chronic kidney disease: Secondary | ICD-10-CM | POA: Diagnosis present

## 2022-09-30 DIAGNOSIS — Z79899 Other long term (current) drug therapy: Secondary | ICD-10-CM

## 2022-09-30 DIAGNOSIS — N390 Urinary tract infection, site not specified: Secondary | ICD-10-CM | POA: Diagnosis not present

## 2022-09-30 DIAGNOSIS — R079 Chest pain, unspecified: Secondary | ICD-10-CM | POA: Diagnosis not present

## 2022-09-30 DIAGNOSIS — Z9049 Acquired absence of other specified parts of digestive tract: Secondary | ICD-10-CM

## 2022-09-30 DIAGNOSIS — M25519 Pain in unspecified shoulder: Secondary | ICD-10-CM | POA: Diagnosis not present

## 2022-09-30 DIAGNOSIS — S199XXA Unspecified injury of neck, initial encounter: Secondary | ICD-10-CM | POA: Diagnosis not present

## 2022-09-30 DIAGNOSIS — M25512 Pain in left shoulder: Secondary | ICD-10-CM | POA: Diagnosis not present

## 2022-09-30 DIAGNOSIS — Z880 Allergy status to penicillin: Secondary | ICD-10-CM

## 2022-09-30 DIAGNOSIS — M25552 Pain in left hip: Secondary | ICD-10-CM | POA: Diagnosis not present

## 2022-09-30 DIAGNOSIS — W19XXXA Unspecified fall, initial encounter: Secondary | ICD-10-CM | POA: Diagnosis present

## 2022-09-30 LAB — CBG MONITORING, ED: Glucose-Capillary: 152 mg/dL — ABNORMAL HIGH (ref 70–99)

## 2022-09-30 LAB — URINALYSIS, ROUTINE W REFLEX MICROSCOPIC
Bilirubin Urine: NEGATIVE
Glucose, UA: NEGATIVE mg/dL
Ketones, ur: NEGATIVE mg/dL
Nitrite: POSITIVE — AB
Protein, ur: 100 mg/dL — AB
Specific Gravity, Urine: 1.006 (ref 1.005–1.030)
WBC, UA: >50 WBC/hpf (ref 0–5)
pH: 6 (ref 5.0–8.0)

## 2022-09-30 LAB — COMPREHENSIVE METABOLIC PANEL
ALT: 15 U/L (ref 0–44)
AST: 22 U/L (ref 15–41)
Albumin: 3.4 g/dL — ABNORMAL LOW (ref 3.5–5.0)
Alkaline Phosphatase: 72 U/L (ref 38–126)
Anion gap: 10 (ref 5–15)
BUN: 18 mg/dL (ref 6–20)
CO2: 20 mmol/L — ABNORMAL LOW (ref 22–32)
Calcium: 8.8 mg/dL — ABNORMAL LOW (ref 8.9–10.3)
Chloride: 101 mmol/L (ref 98–111)
Creatinine, Ser: 1.29 mg/dL — ABNORMAL HIGH (ref 0.44–1.00)
GFR, Estimated: 49 mL/min — ABNORMAL LOW (ref >60)
Glucose, Bld: 189 mg/dL — ABNORMAL HIGH (ref 70–99)
Potassium: 4.4 mmol/L (ref 3.5–5.1)
Sodium: 131 mmol/L — ABNORMAL LOW (ref 135–145)
Total Bilirubin: 0.5 mg/dL (ref 0.3–1.2)
Total Protein: 6.8 g/dL (ref 6.5–8.1)

## 2022-09-30 LAB — CBC WITH DIFFERENTIAL/PLATELET
Abs Immature Granulocytes: 0.09 10*3/uL — ABNORMAL HIGH (ref 0.00–0.07)
Basophils Absolute: 0.1 10*3/uL (ref 0.0–0.1)
Basophils Relative: 1 %
Eosinophils Absolute: 0.1 10*3/uL (ref 0.0–0.5)
Eosinophils Relative: 1 %
HCT: 33.8 % — ABNORMAL LOW (ref 36.0–46.0)
Hemoglobin: 11.4 g/dL — ABNORMAL LOW (ref 12.0–15.0)
Immature Granulocytes: 1 %
Lymphocytes Relative: 23 %
Lymphs Abs: 2.2 10*3/uL (ref 0.7–4.0)
MCH: 30.2 pg (ref 26.0–34.0)
MCHC: 33.7 g/dL (ref 30.0–36.0)
MCV: 89.4 fL (ref 80.0–100.0)
Monocytes Absolute: 0.7 10*3/uL (ref 0.1–1.0)
Monocytes Relative: 8 %
Neutro Abs: 6.6 10*3/uL (ref 1.7–7.7)
Neutrophils Relative %: 66 %
Platelets: 255 10*3/uL (ref 150–400)
RBC: 3.78 MIL/uL — ABNORMAL LOW (ref 3.87–5.11)
RDW: 12.8 % (ref 11.5–15.5)
WBC: 9.8 10*3/uL (ref 4.0–10.5)
nRBC: 0 % (ref 0.0–0.2)

## 2022-09-30 LAB — TROPONIN I (HIGH SENSITIVITY): Troponin I (High Sensitivity): 4 ng/L (ref <18)

## 2022-09-30 LAB — GLUCOSE, CAPILLARY: Glucose-Capillary: 140 mg/dL — ABNORMAL HIGH (ref 70–99)

## 2022-09-30 LAB — HEMOGLOBIN A1C
Hgb A1c MFr Bld: 7.9 % — ABNORMAL HIGH (ref 4.8–5.6)
Mean Plasma Glucose: 180.03 mg/dL

## 2022-09-30 LAB — CREATININE, URINE, RANDOM: Creatinine, Urine: 44 mg/dL

## 2022-09-30 LAB — FOLATE: Folate: >40 ng/mL (ref >5.9)

## 2022-09-30 LAB — SODIUM, URINE, RANDOM: Sodium, Ur: 51 mmol/L

## 2022-09-30 MED ORDER — ONDANSETRON HCL 4 MG PO TABS: 4.0000 mg | ORAL_TABLET | Freq: Four times a day (QID) | ORAL | Status: DC | PRN

## 2022-09-30 MED ORDER — ADULT MULTIVITAMIN LIQUID CH
15.0000 mL | Freq: Every day | ORAL | Status: DC
  Administered 2022-09-30 – 2022-10-07 (×8): 15 mL via ORAL
  Filled 2022-09-30 (×8): qty 15

## 2022-09-30 MED ORDER — CYCLOBENZAPRINE HCL 10 MG PO TABS
5.0000 mg | ORAL_TABLET | Freq: Once | ORAL | Status: DC
  Filled 2022-09-30: qty 1

## 2022-09-30 MED ORDER — SODIUM CHLORIDE 0.9 % IV SOLN
1.0000 g | Freq: Once | INTRAVENOUS | Status: AC
  Administered 2022-09-30: 1 g via INTRAVENOUS
  Filled 2022-09-30: qty 10

## 2022-09-30 MED ORDER — SODIUM CHLORIDE 0.9 % IV SOLN
1.0000 g | INTRAVENOUS | Status: DC
  Administered 2022-10-01 – 2022-10-02 (×2): 1 g via INTRAVENOUS
  Filled 2022-09-30 (×2): qty 10

## 2022-09-30 MED ORDER — CYCLOBENZAPRINE HCL 10 MG PO TABS
5.0000 mg | ORAL_TABLET | Freq: Once | ORAL | Status: AC
  Administered 2022-09-30: 5 mg via ORAL

## 2022-09-30 MED ORDER — MORPHINE SULFATE (PF) 2 MG/ML IV SOLN
2.0000 mg | Freq: Once | INTRAVENOUS | Status: AC
  Administered 2022-09-30: 2 mg via INTRAVENOUS
  Filled 2022-09-30: qty 1

## 2022-09-30 MED ORDER — INSULIN ASPART 100 UNIT/ML IJ SOLN: 0.0000 [IU] | Freq: Every day | INTRAMUSCULAR | Status: DC

## 2022-09-30 MED ORDER — ONDANSETRON HCL 4 MG/2ML IJ SOLN: 4.0000 mg | Freq: Four times a day (QID) | INTRAMUSCULAR | Status: DC | PRN

## 2022-09-30 MED ORDER — SODIUM CHLORIDE 0.9 % IV SOLN: INTRAVENOUS | Status: DC

## 2022-09-30 MED ORDER — SODIUM CHLORIDE 0.9% FLUSH
3.0000 mL | Freq: Two times a day (BID) | INTRAVENOUS | Status: DC
  Administered 2022-10-01 – 2022-10-07 (×12): 3 mL via INTRAVENOUS

## 2022-09-30 MED ORDER — LACTATED RINGERS IV BOLUS
1000.0000 mL | Freq: Once | INTRAVENOUS | Status: AC
  Administered 2022-09-30: 1000 mL via INTRAVENOUS

## 2022-09-30 MED ORDER — ENOXAPARIN SODIUM 40 MG/0.4ML IJ SOSY
40.0000 mg | PREFILLED_SYRINGE | INTRAMUSCULAR | Status: DC
  Administered 2022-09-30 – 2022-10-06 (×7): 40 mg via SUBCUTANEOUS
  Filled 2022-09-30 (×7): qty 0.4

## 2022-09-30 MED ORDER — TRAZODONE HCL 50 MG PO TABS
50.0000 mg | ORAL_TABLET | Freq: Every day | ORAL | Status: DC
  Administered 2022-09-30 – 2022-10-06 (×7): 50 mg via ORAL
  Filled 2022-09-30 (×7): qty 1

## 2022-09-30 MED ORDER — PANTOPRAZOLE SODIUM 40 MG PO TBEC
40.0000 mg | DELAYED_RELEASE_TABLET | Freq: Every day | ORAL | Status: DC
  Administered 2022-09-30 – 2022-10-07 (×8): 40 mg via ORAL
  Filled 2022-09-30 (×8): qty 1

## 2022-09-30 MED ORDER — HYDROMORPHONE HCL 1 MG/ML IJ SOLN
0.5000 mg | INTRAMUSCULAR | Status: DC | PRN
  Administered 2022-09-30 – 2022-10-01 (×4): 0.5 mg via INTRAVENOUS
  Filled 2022-09-30 (×4): qty 1

## 2022-09-30 MED ORDER — MORPHINE SULFATE (PF) 2 MG/ML IV SOLN
2.0000 mg | INTRAVENOUS | Status: DC | PRN
  Administered 2022-09-30: 2 mg via INTRAVENOUS
  Filled 2022-09-30: qty 1

## 2022-09-30 MED ORDER — INSULIN ASPART 100 UNIT/ML IJ SOLN
0.0000 [IU] | Freq: Three times a day (TID) | INTRAMUSCULAR | Status: DC
  Administered 2022-09-30: 1 [IU] via SUBCUTANEOUS
  Administered 2022-09-30: 2 [IU] via SUBCUTANEOUS
  Administered 2022-10-01 – 2022-10-02 (×4): 1 [IU] via SUBCUTANEOUS
  Administered 2022-10-02 – 2022-10-03 (×2): 2 [IU] via SUBCUTANEOUS
  Administered 2022-10-03: 1 [IU] via SUBCUTANEOUS
  Filled 2022-09-30 (×9): qty 1

## 2022-09-30 MED ORDER — GABAPENTIN 300 MG PO CAPS
300.0000 mg | ORAL_CAPSULE | Freq: Every day | ORAL | Status: DC
  Administered 2022-09-30 – 2022-10-06 (×7): 300 mg via ORAL
  Filled 2022-09-30 (×7): qty 1

## 2022-09-30 MED ORDER — INSULIN ASPART 100 UNIT/ML IJ SOLN: 3.0000 [IU] | Freq: Three times a day (TID) | INTRAMUSCULAR | Status: DC

## 2022-09-30 NOTE — Progress Notes (Signed)
PT Cancellation Note  Patient Details Name: Deanna Joseph MRN: [id number] DOB: 04-21-1966   Cancelled Treatment:    Reason Eval/Treat Not Completed: Other (comment) Chart reviewed, chat with attending MD.  Will wait for ortho consult before starting PT; will continue to follow and initiate eval when appropriate.  [name] 09/30/2022, 1:15 PM

## 2022-09-30 NOTE — ED Notes (Signed)
Pt returned from CT via stretcher.

## 2022-09-30 NOTE — ED Notes (Signed)
Pt transported to Xray via stretcher

## 2022-09-30 NOTE — Assessment & Plan Note (Addendum)
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

## 2022-09-30 NOTE — Assessment & Plan Note (Signed)
Positive syncopal event today with noted secondary left humerus fracture Suspect multifactorial in setting of UTI and chronic deconditioning/weakness CT head stable Given?  Slurred speech and confusion (subacute versus chronic), will check MRI of the brain as well as 2D echo Clinically dry IV fluid hydration Check orthostatics PT OT evaluation

## 2022-09-30 NOTE — ED Provider Notes (Signed)
[facility] Provider Note    Event Date/Time   First MD Initiated Contact with Patient 09/30/22 0700     (approximate)   History   Fall   HPI  Deanna Joseph is a 57 y.o. female with past medical history of Bell's palsy type 2 diabetes GERD Warnicke's encephalopathy who presents because of multiple falls.  Patient's husband notes that he heard a thump this morning and found patient on the floor.  She was trying to communicate but was slurring her speech and was only partially conscious.  She then came to and tried to get her up and she fell again.  Patient does not really remember the fall cannot tell me if she was lightheaded or not.  Looks to her husband to answer questions when I ask her questions.  She endorses significant left shoulder pain.  She has chronic numbness in bilateral upper and lower extremities is unchanged no new numbness or weakness.  Does endorse hip pain as well.  Denies any chest pain or dyspnea.  No recent illnesses fevers chills.  She is incontinent of urine which is her baseline.  Denies other urinary symptoms.  Has been eating and drinking normally.  Husband tells her she is at her baseline currently.  She was not able to ambulate after the fall today secondary to pain.     Past Medical History:  Diagnosis Date   Diabetes mellitus without complication     Patient Active Problem List   Diagnosis Date Noted   Protein-calorie malnutrition, severe 07/24/2019   Nausea    Ptosis, left eyelid    Acute gastritis without hemorrhage    Weakness    Type 2 diabetes mellitus with hyperlipidemia    Depression    Hypomagnesemia    Gastroparesis 07/18/2019   Intractable vomiting with nausea 07/17/2019   Unintentional weight loss of more than 10 pounds in 90 days 07/17/2019   History of chronic gastritis 07/17/2019   History of Helicobacter pylori infection 07/17/2019   Diet-controlled diabetes mellitus 07/17/2019   AKI (acute kidney  injury) 07/17/2019   Metabolic acidosis 07/17/2019   Hypokalemia 07/17/2019   Hypotension 07/17/2019     Physical Exam  Triage Vital Signs: ED Triage Vitals  Enc Vitals Group     BP 09/30/22 0637 (!) 155/73     Pulse Rate 09/30/22 0637 73     Resp 09/30/22 0637 18     Temp 09/30/22 0637 98.1 F (36.7 C)     Temp Source 09/30/22 0637 Oral     SpO2 09/30/22 0637 98 %     Weight 09/30/22 0630 149 lb (67.6 kg)     Height 09/30/22 0630  (1.549 m)     Head Circumference --      Peak Flow --      Pain Score 09/30/22 0647 8     Pain Loc --      Pain Edu? --      Excl. in GC? --     Most recent vital signs: Vitals:   09/30/22 0637 09/30/22 0900  BP: (!) 155/73 (!) 158/70  Pulse: 73 86  Resp: 18 12  Temp: 98.1 F (36.7 C)   SpO2: 98% 100%     General: Awake, no distress.  CV:  Good peripheral perfusion.  Resp:  Normal effort.  Abd:  No distention.  Neuro:             Awake, Alert, Oriented x 3  Other:  + Midline and left paraspinal C-spine tenderness but normal range of motion of the C-spine Bilateral anterior chest wall tenderness no crepitus no ecchymosis Abdomen is soft nontender Tenderness to palpation of bilateral hips left greater than right but patient is able to range both hips without difficulty Tenderness palpation over the left anterior shoulder very limited range of motion and there is some crepitus with range of motion of the left shoulder no focal tenderness of forearm wrist elbow bilateral upper extremities     ED Results / Procedures / Treatments  Labs (all labs ordered are listed, but only abnormal results are displayed) Labs Reviewed  CBC WITH DIFFERENTIAL/PLATELET - Abnormal; Notable for the following components:      Result Value   RBC 3.78 (*)    Hemoglobin 11.4 (*)    HCT 33.8 (*)    Abs Immature Granulocytes 0.09 (*)    All other components within normal limits  COMPREHENSIVE METABOLIC PANEL - Abnormal; Notable for the following  components:   Sodium 131 (*)    CO2 20 (*)    Glucose, Bld 189 (*)    Creatinine, Ser 1.29 (*)    Calcium 8.8 (*)    Albumin 3.4 (*)    GFR, Estimated 49 (*)    All other components within normal limits  URINALYSIS, ROUTINE W REFLEX MICROSCOPIC - Abnormal; Notable for the following components:   Color, Urine YELLOW (*)    APPearance CLOUDY (*)    Hgb urine dipstick SMALL (*)    Protein, ur 100 (*)    Nitrite POSITIVE (*)    Leukocytes,Ua LARGE (*)    Bacteria, UA MANY (*)    All other components within normal limits  URINE CULTURE  TROPONIN I (HIGH SENSITIVITY)     EKG  EKG interpretation performed by myself: NSR, nml axis, nml intervals, no acute ischemic changes    RADIOLOGY I reviewed and interpreted the CT scan of the brain which does not show any acute intracranial process    PROCEDURES:  Critical Care performed: No  .1-3 Lead EKG Interpretation  Performed by: Georga Hacking, MD Authorized by: Georga Hacking, MD     Interpretation: normal     ECG rate assessment: normal     Rhythm: sinus rhythm     Ectopy: none     Conduction: normal     The patient is on the cardiac monitor to evaluate for evidence of arrhythmia and/or significant heart rate changes.   MEDICATIONS ORDERED IN ED: Medications  lactated ringers bolus 1,000 mL (0 mLs Intravenous Stopped 09/30/22 1016)  cefTRIAXone (ROCEPHIN) 1 g in sodium chloride 0.9 % 100 mL IVPB (0 g Intravenous Stopped 09/30/22 0748)  morphine (PF) 2 MG/ML injection 2 mg (2 mg Intravenous Given 09/30/22 0744)  morphine (PF) 2 MG/ML injection 2 mg (2 mg Intravenous Given 09/30/22 0920)     IMPRESSION / MDM / ASSESSMENT AND PLAN / ED COURSE  I reviewed the triage vital signs and the nursing notes.                              Patient's presentation is most consistent with acute presentation with potential threat to life or bodily function.  Differential diagnosis includes, but is not limited to, intracranial  hemorrhage, C-spine fracture, humeral head fracture, clavicle fracture, AC separation, syncope, orthostatic hypotension  Patient is a 57 year old female who is chronically ill with memory impairment  neuropathy, Warnicke's encephalopathy?  Who presents today because of 2 falls with left shoulder pain.  Patient's husband tells me that he heard a thump found her on the ground and she was slurring her speech patient does not recall the event or whether she had loss of consciousness or presyncope or syncope or lightheadedness.  He tried to get her up and she fell again.  She complains of left shoulder pain arm pain hip pain.  Otherwise has been doing well patient's husband denies any change in mental status.  She was incontinent during the episode but she wears a depends and is incontinent frequently.  Denies any urinary urgency frequency or dysuria no fevers.  Patient does look chronically ill she is tender over the left shoulder has significant decreased range of motion but is neurovascular tact with good pulses.  She does have some tenderness of bilateral hips but is able to range the hips my suspicion for fracture is low.  Also has some chest wall tenderness tenderness over the left hand but no outward signs of trauma no ecchymosis.  I am suspicious for syncopal episode given patient was slurring her speech when husband found her may have had hypotension resulting in this.  Plan to obtain CT head C-spine x-ray pelvis x-ray left shoulder left hand and labs and EKG.  Patient's labs are notable for an AKI with creatinine 1.3 from baseline around 0.5.  Bicarb and sodium slightly low.  Troponin negative.  EC with mild anemia no leukocytosis.  Urinalysis has greater than 50 white cells many bacteria nitrate positive.  Will treat as possible UTI.  Urine culture sent.  EKG has no concerning findings suggest cardiac source of syncope.  Patient's x-ray of the left shoulder does show a proximal humerus fracture that is  mildly displaced.  CT head and C-spine are without acute abnormality.  X-ray of the pelvis and hips does not show any acute fracture.  Attempted to ambulate the patient but she has significant left shoulder and bilateral hip pain.  This obtain CT of the pelvis which is negative for fracture.  Again attempted to ambulate the patient but she was unable due to severe pain in the left shoulder, spite 2 doses of IV morphine.  Given patient's chronic debility, now with shoulder fracture causing significant pain AKI and UTI I do feel that she would benefit from admission.  She is receiving Rocephin IV fluids.  Will discuss with the hospitalist.  I have Dr. Valentina Shaggy in with orthopedics to let him know about the patient's proximal humerus fracture.  Patient is in a shoulder immobilizer. FINAL CLINICAL IMPRESSION(S) / ED DIAGNOSES   Final diagnoses:  Fall, initial encounter  Urinary tract infection without hematuria, site unspecified  AKI (acute kidney injury)     Rx / DC Orders   ED Discharge Orders     None        Note:  This document was prepared using Dragon voice recognition software and may include unintentional dictation errors.   Georga Hacking, MD 09/30/22 1025

## 2022-09-30 NOTE — Assessment & Plan Note (Signed)
UA indicative of infection in setting of worsening confusion and syncope  IV rocephin  Urine culture  Follow

## 2022-09-30 NOTE — Assessment & Plan Note (Signed)
Creatinine 1.3 today with GFR in the 40s with baseline creatinine around 0.6 Clinically dry IV fluid hydration Check FENa Renal imaging as clinically indicated Hold nephrotoxic agents Monitor

## 2022-09-30 NOTE — Plan of Care (Signed)

## 2022-09-30 NOTE — ED Notes (Signed)
Pt still in MRI 

## 2022-09-30 NOTE — Progress Notes (Signed)
PT Cancellation Note  Patient Details Name: Deanna Joseph MRN: [id number] DOB: 10-24-1965   Cancelled Treatment:    Reason Eval/Treat Not Completed: Other (comment) Spoke with nurse, reports pt has c/o increased hip/leg pain and needed increased pain meds.  Still no ortho note, etc.  Will hold PT this date and try back when appropriate.    [name], DPT 09/30/2022, 4:43 PM

## 2022-09-30 NOTE — Plan of Care (Signed)
  Problem: Education: Goal: Knowledge of condition and prescribed therapy will improve 09/30/2022 1635 by Leonie Douglas, RN Outcome: Progressing 09/30/2022 1634 by Leonie Douglas, RN Outcome: Progressing   Problem: Cardiac: Goal: Will achieve and/or maintain adequate cardiac output 09/30/2022 1635 by Leonie Douglas, RN Outcome: Progressing 09/30/2022 1634 by Leonie Douglas, RN Outcome: Progressing   Problem: Physical Regulation: Goal: Complications related to the disease process, condition or treatment will be avoided or minimized Outcome: Progressing   Problem: Education: Goal: Ability to describe self-care measures that may prevent or decrease complications (Diabetes Survival Skills Education) will improve 09/30/2022 1635 by Leonie Douglas, RN Outcome: Progressing 09/30/2022 1634 by Leonie Douglas, RN Outcome: Progressing Goal: Individualized Educational Video(s) Outcome: Progressing   Problem: Coping: Goal: Ability to adjust to condition or change in health will improve Outcome: Progressing   Problem: Fluid Volume: Goal: Ability to maintain a balanced intake and output will improve 09/30/2022 1635 by Leonie Douglas, RN Outcome: Progressing 09/30/2022 1634 by Leonie Douglas, RN Outcome: Progressing   Problem: Health Behavior/Discharge Planning: Goal: Ability to identify and utilize available resources and services will improve Outcome: Progressing Goal: Ability to manage health-related needs will improve 09/30/2022 1635 by Leonie Douglas, RN Outcome: Progressing 09/30/2022 1634 by Leonie Douglas, RN Outcome: Progressing   Problem: Metabolic: Goal: Ability to maintain appropriate glucose levels will improve 09/30/2022 1635 by Leonie Douglas, RN Outcome: Progressing 09/30/2022 1634 by Leonie Douglas, RN Outcome: Progressing   Problem: Nutritional: Goal: Maintenance of adequate nutrition will improve 09/30/2022 1635 by Leonie Douglas, RN Outcome: Progressing 09/30/2022 1634 by Leonie Douglas, RN Outcome: Progressing Goal: Progress toward achieving an optimal weight will improve Outcome: Progressing   Problem: Skin Integrity: Goal: Risk for impaired skin integrity will decrease Outcome: Progressing   Problem: Tissue Perfusion: Goal: Adequacy of tissue perfusion will improve Outcome: Progressing   Problem: Education: Goal: Knowledge of General Education information will improve Description: Including pain rating scale, medication(s)/side effects and non-pharmacologic comfort measures Outcome: Progressing   Problem: Health Behavior/Discharge Planning: Goal: Ability to manage health-related needs will improve Outcome: Progressing   Problem: Clinical Measurements: Goal: Ability to maintain clinical measurements within normal limits will improve Outcome: Progressing Goal: Will remain free from infection Outcome: Progressing Goal: Diagnostic test results will improve Outcome: Progressing Goal: Respiratory complications will improve Outcome: Progressing Goal: Cardiovascular complication will be avoided Outcome: Progressing   Problem: Activity: Goal: Risk for activity intolerance will decrease Outcome: Progressing   Problem: Nutrition: Goal: Adequate nutrition will be maintained Outcome: Progressing   Problem: Coping: Goal: Level of anxiety will decrease Outcome: Progressing   Problem: Elimination: Goal: Will not experience complications related to bowel motility Outcome: Progressing Goal: Will not experience complications related to urinary retention Outcome: Progressing   Problem: Pain Managment: Goal: General experience of comfort will improve Outcome: Progressing   Problem: Safety: Goal: Ability to remain free from injury will improve Outcome: Progressing   Problem: Skin Integrity: Goal: Risk for impaired skin integrity will decrease Outcome: Progressing   Problem:  Activity: Goal: Ability to tolerate increased activity will improve Outcome: Progressing   Problem: Pain Management: Goal: Pain level will decrease with appropriate interventions Outcome: Progressing

## 2022-09-30 NOTE — H&P (Signed)
History and Physical    [name]:[id number] DOB: 06/08/1965 DOA: 09/30/2022 DOS: the patient was seen and examined on 09/30/2022 PCP: Hillery Aldo, MD  Patient coming from: Home  Chief Complaint:  Chief Complaint  Patient presents with   Fall   HPI: Deanna Joseph is a 57 y.o. female with medical history significant of cognitive decline, type 2 diabetes, urinary incontinence, weakness,?  Warnicke encephalopathy presenting with left humerus fracture, syncope, UTI.  History from the patient as well as husband in setting of cognitive decline.  Per report, patient had a witnessed fall where patient landed on her left shoulder.  Patient was unconscious transiently.  Patient was then awakened by her husband.  Positive mild confusion.  Patient does not remember what happened.  Patient does report having some generalized weakness as well as confusion prior to symptoms.  Patient has been states this is a fairly chronic issue.  Urinary incontinence is also fairly chronic.  Weakness and confusion have been more exacerbated over the past week or so.  No fevers or chills.  No nausea or vomiting.  No chest pain or shortness of breath.  Noted recent extensive admission multiple years ago for cognitive decline and weakness.  Noted prior diagnosis of Warnicke encephalopathy in the [facility] system.  No overt dysuria or foul-smelling urine. Presented to the ER afebrile, hemodynamically stable.  Satting well on room air.  White count 9.8, hemoglobin 11.4, platelets 255, creatinine 1.3, glucose 189 urinalysis nitrite leukocyte positive.  CT head grossly stable apart from premature temporal lobe atrophy.  Left shoulder plain films with surgical neck humerus fracture with mild impaction.  CT C-spine and CT pelvis grossly stable. Review of Systems: As mentioned in the history of present illness. All other systems reviewed and are negative. Past Medical History:  Diagnosis Date   Diabetes mellitus without  complication    Past Surgical History:  Procedure Laterality Date   CESAREAN SECTION     CHOLECYSTECTOMY     DILATION AND CURETTAGE OF UTERUS     ESOPHAGOGASTRODUODENOSCOPY N/A 07/18/2019   Procedure: ESOPHAGOGASTRODUODENOSCOPY (EGD);  Surgeon: Toledo, Boykin Nearing, MD;  Location: [facility] ENDOSCOPY;  Service: Gastroenterology;  Laterality: N/A;   Social History:  reports that she has never smoked. She has never used smokeless tobacco. No history on file for alcohol use and drug use.  Allergies  Allergen Reactions   Penicillins     Rash     Family History  Problem Relation Age of Onset   Breast cancer Maternal Aunt 55    Prior to Admission medications   Medication Sig Start Date End Date Taking? Authorizing Provider  acetaminophen (TYLENOL) 500 MG tablet Take 500 mg by mouth every 6 (six) hours as needed.   Yes [provider]  folic acid (FOLVITE) 1 MG tablet Take 1 mg by mouth daily.   Yes [provider]  gabapentin (NEURONTIN) 300 MG capsule Take 300 mg by mouth at bedtime.   Yes [provider]  omeprazole (PRILOSEC) 20 MG capsule Take 20 mg by mouth daily.   Yes [provider]  polyethylene glycol (MIRALAX / GLYCOLAX) 17 g packet Take 17 g by mouth 2 (two) times daily. Can decrease to once a day if having >2 BM's per day. 07/30/19  Yes Darlin Priestly, MD  traZODone (DESYREL) 50 MG tablet Take 50 mg by mouth at bedtime.   Yes [provider]  aspirin EC 81 MG tablet Take 81 mg by mouth daily.  Patient not taking: Reported on 09/30/2022    [provider]  atorvastatin (LIPITOR) 20 MG tablet Take 20 mg by mouth daily. Patient not taking: Reported on 09/30/2022    [provider]  carbamazepine (TEGRETOL) 200 MG tablet Take 200 mg by mouth 2 (two) times daily. Patient not taking: Reported on 09/30/2022 07/13/22   [provider]  cetirizine (ZYRTEC) 10 MG tablet Take 10 mg by mouth daily. Patient not taking: Reported on  09/30/2022    [provider]  DULoxetine (CYMBALTA) 60 MG capsule Take 60 mg by mouth daily. Patient not taking: Reported on 09/30/2022    [provider]  feeding supplement, ENSURE ENLIVE, (ENSURE ENLIVE) LIQD Take 237 mLs by mouth 3 (three) times daily between meals. 07/30/19   Darlin Priestly, MD  PARoxetine (PAXIL) 40 MG tablet Take 40 mg by mouth daily. Patient not taking: Reported on 09/30/2022    [provider]  Prenatal Vit-Fe Fumarate-FA (M-NATAL PLUS) 27-1 MG TABS Take 1 tablet by mouth daily. Patient not taking: Reported on 09/30/2022 08/30/22   [provider]  rosuvastatin (CRESTOR) 5 MG tablet Take 5 mg by mouth daily. Patient not taking: Reported on 09/30/2022 07/27/22   [provider]  topiramate (TOPAMAX) 25 MG tablet Take 25 mg by mouth daily. Patient not taking: Reported on 09/30/2022 08/30/22   [provider]    Physical Exam: Vitals:   09/30/22 0637 09/30/22 0900 09/30/22 1030 09/30/22 1100  BP: (!) 155/73 (!) 158/70 (!) 161/71 136/72  Pulse: 73 86 91 86  Resp: 18 12 19 20   Temp: 98.1 F (36.7 C)  98.4 F (36.9 C)   TempSrc: Oral  Oral   SpO2: 98% 100% 100% 100%  Weight:      Height:       Physical Exam Constitutional:      Appearance: She is normal weight.  HENT:     Head: Normocephalic.     Nose: Nose normal.     Mouth/Throat:     Mouth: Mucous membranes are dry.  Eyes:     Pupils: Pupils are equal, round, and reactive to light.  Cardiovascular:     Rate and Rhythm: Normal rate and regular rhythm.  Pulmonary:     Effort: Pulmonary effort is normal.  Abdominal:     General: Bowel sounds are normal.  Musculoskeletal:     Cervical back: Normal range of motion.     Comments: Left shoulder in sling  Skin:    General: Skin is warm.  Neurological:     General: No focal deficit present.     Comments: Positive mild confusion, otherwise grossly stable neuroexam  Psychiatric:        Mood and Affect: Mood  normal.     Data Reviewed:  There are no new results to review at this time. CT PELVIS WO CONTRAST CLINICAL DATA:  Pain after fall  EXAM: CT PELVIS WITHOUT CONTRAST  TECHNIQUE: Multidetector CT imaging of the pelvis was performed following the standard protocol without intravenous contrast.  RADIATION DOSE REDUCTION: This exam was performed according to the departmental dose-optimization program which includes automated exposure control, adjustment of the mA and/or kV according to patient size and/or use of iterative reconstruction technique.  COMPARISON:  X-ray earlier 09/30/2022  FINDINGS: Urinary Tract:  Preserved contours of the urinary bladder.  Bowel: Visualized bowel in the pelvis is nondilated. There is scattered colonic stool. Normal retrocecal appendix.  Vascular/Lymphatic: Mild scattered vascular calcifications. No specific abnormal  lymph node enlargement identified in the pelvis.  Reproductive:  No adnexal mass.  Other:  Small amount of free fluid in the pelvis.  Musculoskeletal: Degenerative changes seen of the visualized lumbar spine. There is some sclerosis along the superior endplate of S1. Mild joint space loss, osteophytes and sclerosis of the sacroiliac joints. Concentric mild joint space loss of the hips with osteophytes. No fracture is clearly seen today. Mild anasarca.  IMPRESSION: Multifocal changes.  No fracture or dislocation seen.  Trace nonspecific free fluid in the pelvis.  Electronically Signed   By: Karen Kays M.D.   On: 09/30/2022 09:55 DG Hand Complete Left CLINICAL DATA:  Pain after fall  EXAM: LEFT HAND - COMPLETE 3 VIEW  COMPARISON:  None Available.  FINDINGS: Monitoring lead obscures the distal aspect of the second phalanx. Otherwise no underlying fracture or dislocation. Preserved joint spaces and bone mineralization. Minimal degenerative changes of the interphalangeal joints.  IMPRESSION: No acute osseous  abnormality  Electronically Signed   By: Karen Kays M.D.   On: 09/30/2022 09:45 DG HIPS BILAT WITH PELVIS MIN 5 VIEWS CLINICAL DATA:  Fall  EXAM: DG HIP (WITH OR WITHOUT PELVIS) 5V BILAT  COMPARISON:  March 16, 2019 right hip radiograph.  FINDINGS: No evidence of hip fracture or dislocation. Moderate degenerative changes of the bilateral hips. Soft tissues are unremarkable.  IMPRESSION: 1. No acute fracture or dislocation. 2. Moderate degenerative changes of the bilateral hips.  Electronically Signed   By: Allegra Lai M.D.   On: 09/30/2022 08:55 DG Chest Portable 1 View CLINICAL DATA:  Left hip pain after fall.  EXAM: PORTABLE CHEST 1 VIEW  COMPARISON:  None Available.  FINDINGS: The heart size and mediastinal contours are within normal limits. Both lungs are clear. No visible pleural effusions or pneumothorax. No acute osseous abnormality.  IMPRESSION: No active disease.  Electronically Signed   By: Feliberto Harts M.D.   On: 09/30/2022 08:54 DG Shoulder Left CLINICAL DATA:  Left shoulder pain after fall.  EXAM: LEFT SHOULDER - 3 VIEW  COMPARISON:  None Available.  FINDINGS: Surgical neck humerus fracture without measurable displacement. On the scapular Y-view there is lateral sided impaction by 7 mm. Some comminution towards the base of the greater tuberosity but no complete tuberosity fracture is noted. Located glenohumeral and acromioclavicular joints. Negative covered left chest.  IMPRESSION: Surgical neck humerus fracture with mild impaction.  Electronically Signed   By: Tiburcio Pea M.D.   On: 09/30/2022 07:31 CT Cervical Spine Wo Contrast CLINICAL DATA:  Moderate to severe head trauma  EXAM: CT HEAD WITHOUT CONTRAST  CT CERVICAL SPINE WITHOUT CONTRAST  TECHNIQUE: Multidetector CT imaging of the head and cervical spine was performed following the standard protocol without intravenous contrast. Multiplanar CT image  reconstructions of the cervical spine were also generated.  RADIATION DOSE REDUCTION: This exam was performed according to the departmental dose-optimization program which includes automated exposure control, adjustment of the mA and/or kV according to patient size and/or use of iterative reconstruction technique.  COMPARISON:  None Available.  FINDINGS: CT HEAD FINDINGS  Brain: No evidence of acute infarction, hemorrhage, hydrocephalus, extra-axial collection or mass lesion/mass effect. Prominent medial temporal lobe atrophy, age premature.  Vascular: No hyperdense vessel or unexpected calcification.  Skull: Normal. Negative for fracture or focal lesion.  Sinuses/Orbits: No acute finding.  CT CERVICAL SPINE FINDINGS  Alignment: Normal.  Skull base and vertebrae: No acute fracture. No primary bone lesion or focal pathologic process.  Soft tissues  and spinal canal: No prevertebral fluid or swelling. No visible canal hematoma.  Disc levels:  No significant degenerative changes  Upper chest: No visible injury  Other: Partial coverage of bilateral parotid atrophy.  IMPRESSION: 1. No evidence of acute intracranial or cervical spine injury. 2. Premature temporal lobe atrophy.  Electronically Signed   By: Tiburcio Pea M.D.   On: 09/30/2022 07:23 CT HEAD WO CONTRAST ( ) CLINICAL DATA:  Moderate to severe head trauma  EXAM: CT HEAD WITHOUT CONTRAST  CT CERVICAL SPINE WITHOUT CONTRAST  TECHNIQUE: Multidetector CT imaging of the head and cervical spine was performed following the standard protocol without intravenous contrast. Multiplanar CT image reconstructions of the cervical spine were also generated.  RADIATION DOSE REDUCTION: This exam was performed according to the departmental dose-optimization program which includes automated exposure control, adjustment of the mA and/or kV according to patient size and/or use of iterative reconstruction  technique.  COMPARISON:  None Available.  FINDINGS: CT HEAD FINDINGS  Brain: No evidence of acute infarction, hemorrhage, hydrocephalus, extra-axial collection or mass lesion/mass effect. Prominent medial temporal lobe atrophy, age premature.  Vascular: No hyperdense vessel or unexpected calcification.  Skull: Normal. Negative for fracture or focal lesion.  Sinuses/Orbits: No acute finding.  CT CERVICAL SPINE FINDINGS  Alignment: Normal.  Skull base and vertebrae: No acute fracture. No primary bone lesion or focal pathologic process.  Soft tissues and spinal canal: No prevertebral fluid or swelling. No visible canal hematoma.  Disc levels:  No significant degenerative changes  Upper chest: No visible injury  Other: Partial coverage of bilateral parotid atrophy.  IMPRESSION: 1. No evidence of acute intracranial or cervical spine injury. 2. Premature temporal lobe atrophy.  Electronically Signed   By: Tiburcio Pea M.D.   On: 09/30/2022 07:23  Lab Results  Component Value Date   WBC 9.8 09/30/2022   HGB 11.4 (L) 09/30/2022   HCT 33.8 (L) 09/30/2022   MCV 89.4 09/30/2022   PLT 255 09/30/2022   Last metabolic panel Lab Results  Component Value Date   GLUCOSE 189 (H) 09/30/2022   NA 131 (L) 09/30/2022   K 4.4 09/30/2022   CL 101 09/30/2022   CO2 20 (L) 09/30/2022   BUN 18 09/30/2022   CREATININE 1.29 (H) 09/30/2022   GFRNONAA 49 (L) 09/30/2022   CALCIUM 8.8 (L) 09/30/2022   PHOS 3.4 07/30/2019   PROT 6.8 09/30/2022   ALBUMIN 3.4 (L) 09/30/2022   BILITOT 0.5 09/30/2022   ALKPHOS 72 09/30/2022   AST 22 09/30/2022   ALT 15 09/30/2022   ANIONGAP 10 09/30/2022    Assessment and Plan: * Syncope Positive syncopal event today with noted secondary left humerus fracture Suspect multifactorial in setting of UTI and chronic deconditioning/weakness CT head stable Given?  Slurred speech and confusion (subacute versus chronic), will check MRI of the brain as  well as 2D echo Clinically dry IV fluid hydration Check orthostatics PT OT evaluation  Cognitive decline 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     Humerus fracture Noted Surgical neck humerus fracture with mild impaction Sling in place  Case discussed w/ Dr. Audelia Acton w/ orthopedics per Dr. Sidney Ace  Follow    UTI (urinary tract infection) UA indicative of infection in setting of worsening  confusion and syncope  IV rocephin  Urine culture  Follow    Type 2 diabetes mellitus with hyperlipidemia SSI  A1C  AKI (acute kidney injury) Creatinine 1.3 today with GFR in the 40s with baseline creatinine around 0.6 Clinically dry IV fluid hydration Check FENa Renal imaging as clinically indicated Hold nephrotoxic agents Monitor      Advance Care Planning:   Code Status: Full Code   Consults: Pending ortho consulted w/ Dr. Audelia Acton per Dr. Sidney Ace   Family Communication: Husband at the bedside   Severity of Illness: The appropriate patient status for this patient is OBSERVATION. Observation status is judged to be reasonable and necessary in order to provide the required intensity of service to ensure the patient's safety. The patient's presenting symptoms, physical exam findings, and initial radiographic and laboratory data in the context of their medical condition is felt to place them at decreased risk for further clinical deterioration. Furthermore, it is anticipated that the patient will be medically stable for discharge from the hospital within 2 midnights of admission.   Author: Floydene Flock, MD 09/30/2022 11:52 AM  For on call review [email or web address].

## 2022-09-30 NOTE — ED Notes (Signed)
Pt remains gone to MRI.

## 2022-09-30 NOTE — Assessment & Plan Note (Signed)
Noted Surgical neck humerus fracture with mild impaction Sling in place  Case discussed w/ Dr. Audelia Acton w/ orthopedics per Dr. Sidney Ace  Follow

## 2022-09-30 NOTE — ED Triage Notes (Addendum)
Pt presents via [facility] following multiple falls. Pt endorses L shoulder pain  following the fall with associated N/V that occurred this AM. Pt unsure if she hit her head during the fall - no LOC. Pt often wears two briefs and she was completely saturated upon arrival with urine. Hx of frequent UTIs. A&Ox4 at this time. Denies CP or SOB.    VSS with EMS

## 2022-09-30 NOTE — ED Notes (Signed)
Attempted to ambulate pt, but she could not tolerate. Slight position changes cause pt to wince in pain. MD notified.

## 2022-09-30 NOTE — ED Notes (Signed)
Pt transported to MRI 

## 2022-09-30 NOTE — Assessment & Plan Note (Signed)
SSI  A1C  

## 2022-10-01 ENCOUNTER — Observation Stay (HOSPITAL_COMMUNITY)
Admit: 2022-10-01 | Discharge: 2022-10-01 | Disposition: A | Discharge status: Home / Self Care | Payer: Medicaid Other | Attending: Family Medicine | Admitting: Family Medicine

## 2022-10-01 DIAGNOSIS — R4189 Other symptoms and signs involving cognitive functions and awareness: Secondary | ICD-10-CM | POA: Diagnosis not present

## 2022-10-01 DIAGNOSIS — Z79899 Other long term (current) drug therapy: Secondary | ICD-10-CM | POA: Diagnosis not present

## 2022-10-01 DIAGNOSIS — M25512 Pain in left shoulder: Secondary | ICD-10-CM | POA: Diagnosis not present

## 2022-10-01 DIAGNOSIS — Z9049 Acquired absence of other specified parts of digestive tract: Secondary | ICD-10-CM | POA: Diagnosis not present

## 2022-10-01 DIAGNOSIS — R9431 Abnormal electrocardiogram [ECG] [EKG]: Secondary | ICD-10-CM | POA: Diagnosis not present

## 2022-10-01 DIAGNOSIS — N390 Urinary tract infection, site not specified: Secondary | ICD-10-CM | POA: Diagnosis not present

## 2022-10-01 DIAGNOSIS — R200 Anesthesia of skin: Secondary | ICD-10-CM | POA: Diagnosis not present

## 2022-10-01 DIAGNOSIS — E1122 Type 2 diabetes mellitus with diabetic chronic kidney disease: Secondary | ICD-10-CM | POA: Diagnosis not present

## 2022-10-01 DIAGNOSIS — Z880 Allergy status to penicillin: Secondary | ICD-10-CM | POA: Diagnosis not present

## 2022-10-01 DIAGNOSIS — E785 Hyperlipidemia, unspecified: Secondary | ICD-10-CM | POA: Diagnosis not present

## 2022-10-01 DIAGNOSIS — M25552 Pain in left hip: Secondary | ICD-10-CM | POA: Diagnosis not present

## 2022-10-01 DIAGNOSIS — R32 Unspecified urinary incontinence: Secondary | ICD-10-CM | POA: Diagnosis not present

## 2022-10-01 DIAGNOSIS — R079 Chest pain, unspecified: Secondary | ICD-10-CM | POA: Diagnosis not present

## 2022-10-01 DIAGNOSIS — S42202A Unspecified fracture of upper end of left humerus, initial encounter for closed fracture: Secondary | ICD-10-CM | POA: Diagnosis not present

## 2022-10-01 DIAGNOSIS — R55 Syncope and collapse: Secondary | ICD-10-CM | POA: Diagnosis not present

## 2022-10-01 DIAGNOSIS — S7001XS Contusion of right hip, sequela: Secondary | ICD-10-CM | POA: Diagnosis not present

## 2022-10-01 DIAGNOSIS — R296 Repeated falls: Secondary | ICD-10-CM | POA: Diagnosis not present

## 2022-10-01 DIAGNOSIS — S199XXA Unspecified injury of neck, initial encounter: Secondary | ICD-10-CM | POA: Diagnosis not present

## 2022-10-01 DIAGNOSIS — Z803 Family history of malignant neoplasm of breast: Secondary | ICD-10-CM | POA: Diagnosis not present

## 2022-10-01 DIAGNOSIS — N1831 Chronic kidney disease, stage 3a: Secondary | ICD-10-CM | POA: Diagnosis not present

## 2022-10-01 DIAGNOSIS — E512 Wernicke's encephalopathy: Secondary | ICD-10-CM | POA: Diagnosis not present

## 2022-10-01 DIAGNOSIS — W19XXXA Unspecified fall, initial encounter: Secondary | ICD-10-CM | POA: Diagnosis present

## 2022-10-01 DIAGNOSIS — S7001XA Contusion of right hip, initial encounter: Secondary | ICD-10-CM

## 2022-10-01 DIAGNOSIS — S42292A Other displaced fracture of upper end of left humerus, initial encounter for closed fracture: Secondary | ICD-10-CM | POA: Diagnosis not present

## 2022-10-01 DIAGNOSIS — Z043 Encounter for examination and observation following other accident: Secondary | ICD-10-CM | POA: Diagnosis not present

## 2022-10-01 DIAGNOSIS — R4781 Slurred speech: Secondary | ICD-10-CM | POA: Diagnosis not present

## 2022-10-01 DIAGNOSIS — E1169 Type 2 diabetes mellitus with other specified complication: Secondary | ICD-10-CM | POA: Diagnosis not present

## 2022-10-01 DIAGNOSIS — I951 Orthostatic hypotension: Secondary | ICD-10-CM | POA: Diagnosis not present

## 2022-10-01 DIAGNOSIS — B961 Klebsiella pneumoniae [K. pneumoniae] as the cause of diseases classified elsewhere: Secondary | ICD-10-CM | POA: Diagnosis not present

## 2022-10-01 DIAGNOSIS — S0990XA Unspecified injury of head, initial encounter: Secondary | ICD-10-CM | POA: Diagnosis not present

## 2022-10-01 DIAGNOSIS — N179 Acute kidney failure, unspecified: Secondary | ICD-10-CM | POA: Diagnosis not present

## 2022-10-01 LAB — GLUCOSE, CAPILLARY
Glucose-Capillary: 145 mg/dL — ABNORMAL HIGH (ref 70–99)
Glucose-Capillary: 147 mg/dL — ABNORMAL HIGH (ref 70–99)
Glucose-Capillary: 148 mg/dL — ABNORMAL HIGH (ref 70–99)
Glucose-Capillary: 142 mg/dL — ABNORMAL HIGH (ref 70–99)
Glucose-Capillary: 155 mg/dL — ABNORMAL HIGH (ref 70–99)

## 2022-10-01 LAB — COMPREHENSIVE METABOLIC PANEL
ALT: 14 U/L (ref 0–44)
AST: 21 U/L (ref 15–41)
Albumin: 3.1 g/dL — ABNORMAL LOW (ref 3.5–5.0)
Alkaline Phosphatase: 63 U/L (ref 38–126)
Anion gap: 6 (ref 5–15)
BUN: 18 mg/dL (ref 6–20)
CO2: 24 mmol/L (ref 22–32)
Calcium: 8.2 mg/dL — ABNORMAL LOW (ref 8.9–10.3)
Chloride: 105 mmol/L (ref 98–111)
Creatinine, Ser: 1.39 mg/dL — ABNORMAL HIGH (ref 0.44–1.00)
GFR, Estimated: 45 mL/min — ABNORMAL LOW (ref >60)
Glucose, Bld: 160 mg/dL — ABNORMAL HIGH (ref 70–99)
Potassium: 4.8 mmol/L (ref 3.5–5.1)
Sodium: 135 mmol/L (ref 135–145)
Total Bilirubin: 0.6 mg/dL (ref 0.3–1.2)
Total Protein: 6.1 g/dL — ABNORMAL LOW (ref 6.5–8.1)

## 2022-10-01 LAB — CBC
HCT: 29.2 % — ABNORMAL LOW (ref 36.0–46.0)
Hemoglobin: 9.7 g/dL — ABNORMAL LOW (ref 12.0–15.0)
MCH: 30.7 pg (ref 26.0–34.0)
MCHC: 33.2 g/dL (ref 30.0–36.0)
MCV: 92.4 fL (ref 80.0–100.0)
Platelets: 227 10*3/uL (ref 150–400)
RBC: 3.16 MIL/uL — ABNORMAL LOW (ref 3.87–5.11)
RDW: 13.2 % (ref 11.5–15.5)
WBC: 7.5 10*3/uL (ref 4.0–10.5)
nRBC: 0 % (ref 0.0–0.2)

## 2022-10-01 LAB — ECHOCARDIOGRAM COMPLETE
AR max vel: 2.3 cm2
AV Area VTI: 2.81 cm2
AV Area mean vel: 2.34 cm2
AV Mean grad: 3.5 mmHg
AV Peak grad: 6.2 mmHg
Ao pk vel: 1.25 m/s
Area-P 1/2: 4.57 cm2
Height: 61 in
MV VTI: 2.25 cm2
S' Lateral: 2.1 cm
Weight: 2772.5 oz

## 2022-10-01 LAB — HIV ANTIBODY (ROUTINE TESTING W REFLEX): HIV Screen 4th Generation wRfx: NONREACTIVE

## 2022-10-01 MED ORDER — OXYCODONE HCL 5 MG PO TABS
5.0000 mg | ORAL_TABLET | Freq: Four times a day (QID) | ORAL | Status: DC | PRN
  Administered 2022-10-01 – 2022-10-07 (×15): 5 mg via ORAL
  Filled 2022-10-01 (×15): qty 1

## 2022-10-01 MED ORDER — MORPHINE SULFATE (PF) 2 MG/ML IV SOLN
1.0000 mg | INTRAVENOUS | Status: DC | PRN
  Administered 2022-10-01: 1 mg via INTRAVENOUS
  Filled 2022-10-01: qty 1

## 2022-10-01 MED ORDER — HYDROCODONE-ACETAMINOPHEN 5-325 MG PO TABS
1.0000 | ORAL_TABLET | ORAL | Status: DC | PRN
  Administered 2022-10-01: 2 via ORAL
  Filled 2022-10-01: qty 2

## 2022-10-01 NOTE — Progress Notes (Signed)
*  PRELIMINARY RESULTS* Echocardiogram 2D Echocardiogram has been performed.  Cristela Blue 10/01/2022, 8:32 AM

## 2022-10-01 NOTE — Consult Note (Signed)
ORTHOPAEDIC CONSULTATION  REQUESTING PHYSICIAN: Darlin Priestly, MD  Chief Complaint:   Left proximal humerus fracture  History of Present Illness: Deanna Joseph is a 57 y.o. female with past medical history of Bell's palsy type 2 diabetes GERD Warnicke's encephalopathy who presents because of multiple falls found to have a left proximal humerus fracture after a fall yesterday.  Admitted for syncope workup.  Per patient's husband the patient was trying to communicate had some slurring of her speech and lost consciousness as she fell landing on her left shoulder.  The patient reports chronic numbness in the bilateral upper and lower extremities which is subjective in nature with objectively intact sensation.  She reports no changes in her numbness or sensation and it is at her baseline.  She does report some right lateral hip pain as well.  She denies any fevers, chills, recent illness, chest pain, or shortness of breath.  Past Medical History:  Diagnosis Date   Diabetes mellitus without complication (HCC)    Past Surgical History:  Procedure Laterality Date   CESAREAN SECTION     CHOLECYSTECTOMY     DILATION AND CURETTAGE OF UTERUS     ESOPHAGOGASTRODUODENOSCOPY N/A 07/18/2019   Procedure: ESOPHAGOGASTRODUODENOSCOPY (EGD);  Surgeon: Toledo, Boykin Nearing, MD;  Location: [facility] ENDOSCOPY;  Service: Gastroenterology;  Laterality: N/A;   Social History   Socioeconomic History   Marital status: Married    Spouse name: Not on file   Number of children: Not on file   Years of education: Not on file   Highest education level: Not on file  Occupational History   Not on file  Tobacco Use   Smoking status: Never   Smokeless tobacco: Never  Substance and Sexual Activity   Alcohol use: Not on file   Drug use: Not on file   Sexual activity: Not on file  Other Topics Concern   Not on file  Social History Narrative   Not on file    Social Determinants of Health   Financial Resource Strain: Not on file  Food Insecurity: No Food Insecurity (09/30/2022)   Hunger Vital Sign    Worried About Running Out of Food in the Last Year: Never true    Ran Out of Food in the Last Year: Never true  Transportation Needs: No Transportation Needs (09/30/2022)   PRAPARE - Administrator, Civil Service (Medical): No    Lack of Transportation (Non-Medical): No  Physical Activity: Not on file  Stress: Not on file  Social Connections: Not on file   Family History  Problem Relation Age of Onset   Breast cancer Maternal Aunt 71   Allergies  Allergen Reactions   Penicillins     Rash    Prior to Admission medications   Medication Sig Start Date End Date Taking? Authorizing Provider  acetaminophen (TYLENOL) 500 MG tablet Take 500 mg by mouth every 6 (six) hours as needed.   Yes [provider]  folic acid (FOLVITE) 1 MG tablet Take 1 mg by mouth daily.   Yes [provider]  gabapentin (NEURONTIN) 300 MG capsule Take 300 mg by mouth at bedtime.   Yes [provider]  omeprazole (PRILOSEC) 20 MG capsule Take 20 mg by mouth daily.   Yes [provider]  polyethylene glycol (MIRALAX / GLYCOLAX) 17 g packet Take 17 g by mouth 2 (two) times daily. Can decrease to once a day if having >2 BM's per day. 07/30/19  Yes Darlin Priestly, MD  traZODone (DESYREL) 50 MG tablet Take 50 mg by mouth at bedtime.   Yes [provider]  aspirin EC 81 MG tablet Take 81 mg by mouth daily. Patient not taking: Reported on 09/30/2022    [provider]  atorvastatin (LIPITOR) 20 MG tablet Take 20 mg by mouth daily. Patient not taking: Reported on 09/30/2022    [provider]  carbamazepine (TEGRETOL) 200 MG tablet Take 200 mg by mouth 2 (two) times daily. Patient not taking: Reported on 09/30/2022 07/13/22   [provider]  cetirizine (ZYRTEC) 10 MG tablet Take 10 mg by mouth  daily. Patient not taking: Reported on 09/30/2022    [provider]  DULoxetine (CYMBALTA) 60 MG capsule Take 60 mg by mouth daily. Patient not taking: Reported on 09/30/2022    [provider]  feeding supplement, ENSURE ENLIVE, (ENSURE ENLIVE) LIQD Take 237 mLs by mouth 3 (three) times daily between meals. 07/30/19   Darlin Priestly, MD  PARoxetine (PAXIL) 40 MG tablet Take 40 mg by mouth daily. Patient not taking: Reported on 09/30/2022    [provider]  Prenatal Vit-Fe Fumarate-FA (M-NATAL PLUS) 27-1 MG TABS Take 1 tablet by mouth daily. Patient not taking: Reported on 09/30/2022 08/30/22   [provider]  rosuvastatin (CRESTOR) 5 MG tablet Take 5 mg by mouth daily. Patient not taking: Reported on 09/30/2022 07/27/22   [provider]  topiramate (TOPAMAX) 25 MG tablet Take 25 mg by mouth daily. Patient not taking: Reported on 09/30/2022 08/30/22   [provider]   MR BRAIN WO CONTRAST  Result Date: 09/30/2022 CLINICAL DATA:  Head trauma, focal neuro findings (Age 15-64y) EXAM: MRI HEAD WITHOUT CONTRAST TECHNIQUE: Multiplanar, multiecho pulse sequences of the brain and surrounding structures were obtained without intravenous contrast. COMPARISON:  CT head September 30, 2022. FINDINGS: Brain: No acute infarction, hemorrhage, hydrocephalus, extra-axial collection or mass lesion. Cerebral atrophy with temporal lobe predominance. Associated ex vacuo ventricular dilation of the temporal horns. Mild scattered T2/FLAIR hyperintensity white matter, nonspecific but compatible with chronic microvascular ischemic disease. Vascular: Major arterial flow voids are maintained skull base. Skull and upper cervical spine: Normal marrow signal. Sinuses/Orbits: Clear sinuses.  No acute orbital findings. Other: No mastoid effusions. IMPRESSION: 1. No evidence of acute intracranial abnormality. 2. Cerebral atrophy with temporal lobe predominance, age-advanced. This finding is  nonspecific but can be seen with Alzheimer's disease in the correct clinical setting. 3. Mild chronic microvascular ischemic disease. Electronically Signed   By: Feliberto Harts M.D.   On: 09/30/2022 12:54   CT PELVIS WO CONTRAST  Result Date: 09/30/2022 CLINICAL DATA:  Pain after fall EXAM: CT PELVIS WITHOUT CONTRAST TECHNIQUE: Multidetector CT imaging of the pelvis was performed following the standard protocol without intravenous contrast. RADIATION DOSE REDUCTION: This exam was performed according to the departmental dose-optimization program which includes automated exposure control, adjustment of the mA and/or kV according to patient size and/or use of iterative reconstruction technique. COMPARISON:  X-ray earlier 09/30/2022 FINDINGS: Urinary Tract:  Preserved contours of the urinary bladder. Bowel: Visualized bowel in the pelvis is nondilated. There is scattered colonic stool. Normal retrocecal appendix. Vascular/Lymphatic: Mild scattered vascular calcifications. No specific abnormal lymph node enlargement identified in the pelvis. Reproductive:  No adnexal mass. Other:  Small amount of free fluid in the pelvis. Musculoskeletal: Degenerative changes seen of the visualized lumbar spine. There is some sclerosis along the superior endplate of S1. Mild joint space loss, osteophytes and sclerosis of the sacroiliac joints. Concentric  mild joint space loss of the hips with osteophytes. No fracture is clearly seen today. Mild anasarca. IMPRESSION: Multifocal changes.  No fracture or dislocation seen. Trace nonspecific free fluid in the pelvis. Electronically Signed   By: Karen Kays M.D.   On: 09/30/2022 09:55   DG Hand Complete Left  Result Date: 09/30/2022 CLINICAL DATA:  Pain after fall EXAM: LEFT HAND - COMPLETE 3 VIEW COMPARISON:  None Available. FINDINGS: Monitoring lead obscures the distal aspect of the second phalanx. Otherwise no underlying fracture or dislocation. Preserved joint spaces and bone  mineralization. Minimal degenerative changes of the interphalangeal joints. IMPRESSION: No acute osseous abnormality Electronically Signed   By: Karen Kays M.D.   On: 09/30/2022 09:45   DG HIPS BILAT WITH PELVIS MIN 5 VIEWS  Result Date: 09/30/2022 CLINICAL DATA:  Fall EXAM: DG HIP (WITH OR WITHOUT PELVIS) 5V BILAT COMPARISON:  March 16, 2019 right hip radiograph. FINDINGS: No evidence of hip fracture or dislocation. Moderate degenerative changes of the bilateral hips. Soft tissues are unremarkable. IMPRESSION: 1. No acute fracture or dislocation. 2. Moderate degenerative changes of the bilateral hips. Electronically Signed   By: Allegra Lai M.D.   On: 09/30/2022 08:55   DG Chest Portable 1 View  Result Date: 09/30/2022 CLINICAL DATA:  Left hip pain after fall. EXAM: PORTABLE CHEST 1 VIEW COMPARISON:  None Available. FINDINGS: The heart size and mediastinal contours are within normal limits. Both lungs are clear. No visible pleural effusions or pneumothorax. No acute osseous abnormality. IMPRESSION: No active disease. Electronically Signed   By: Feliberto Harts M.D.   On: 09/30/2022 08:54   DG Shoulder Left  Result Date: 09/30/2022 CLINICAL DATA:  Left shoulder pain after fall. EXAM: LEFT SHOULDER - 3 VIEW COMPARISON:  None Available. FINDINGS: Surgical neck humerus fracture without measurable displacement. On the scapular Y-view there is lateral sided impaction by 7 mm. Some comminution towards the base of the greater tuberosity but no complete tuberosity fracture is noted. Located glenohumeral and acromioclavicular joints. Negative covered left chest. IMPRESSION: Surgical neck humerus fracture with mild impaction. Electronically Signed   By: Tiburcio Pea M.D.   On: 09/30/2022 07:31   CT HEAD WO CONTRAST ( )  Result Date: 09/30/2022 CLINICAL DATA:  Moderate to severe head trauma EXAM: CT HEAD WITHOUT CONTRAST CT CERVICAL SPINE WITHOUT CONTRAST TECHNIQUE: Multidetector CT imaging of  the head and cervical spine was performed following the standard protocol without intravenous contrast. Multiplanar CT image reconstructions of the cervical spine were also generated. RADIATION DOSE REDUCTION: This exam was performed according to the departmental dose-optimization program which includes automated exposure control, adjustment of the mA and/or kV according to patient size and/or use of iterative reconstruction technique. COMPARISON:  None Available. FINDINGS: CT HEAD FINDINGS Brain: No evidence of acute infarction, hemorrhage, hydrocephalus, extra-axial collection or mass lesion/mass effect. Prominent medial temporal lobe atrophy, age premature. Vascular: No hyperdense vessel or unexpected calcification. Skull: Normal. Negative for fracture or focal lesion. Sinuses/Orbits: No acute finding. CT CERVICAL SPINE FINDINGS Alignment: Normal. Skull base and vertebrae: No acute fracture. No primary bone lesion or focal pathologic process. Soft tissues and spinal canal: No prevertebral fluid or swelling. No visible canal hematoma. Disc levels:  No significant degenerative changes Upper chest: No visible injury Other: Partial coverage of bilateral parotid atrophy. IMPRESSION: 1. No evidence of acute intracranial or cervical spine injury. 2. Premature temporal lobe atrophy. Electronically Signed   By: Tiburcio Pea M.D.   On: 09/30/2022 07:23  CT Cervical Spine Wo Contrast  Result Date: 09/30/2022 CLINICAL DATA:  Moderate to severe head trauma EXAM: CT HEAD WITHOUT CONTRAST CT CERVICAL SPINE WITHOUT CONTRAST TECHNIQUE: Multidetector CT imaging of the head and cervical spine was performed following the standard protocol without intravenous contrast. Multiplanar CT image reconstructions of the cervical spine were also generated. RADIATION DOSE REDUCTION: This exam was performed according to the departmental dose-optimization program which includes automated exposure control, adjustment of the mA and/or kV  according to patient size and/or use of iterative reconstruction technique. COMPARISON:  None Available. FINDINGS: CT HEAD FINDINGS Brain: No evidence of acute infarction, hemorrhage, hydrocephalus, extra-axial collection or mass lesion/mass effect. Prominent medial temporal lobe atrophy, age premature. Vascular: No hyperdense vessel or unexpected calcification. Skull: Normal. Negative for fracture or focal lesion. Sinuses/Orbits: No acute finding. CT CERVICAL SPINE FINDINGS Alignment: Normal. Skull base and vertebrae: No acute fracture. No primary bone lesion or focal pathologic process. Soft tissues and spinal canal: No prevertebral fluid or swelling. No visible canal hematoma. Disc levels:  No significant degenerative changes Upper chest: No visible injury Other: Partial coverage of bilateral parotid atrophy. IMPRESSION: 1. No evidence of acute intracranial or cervical spine injury. 2. Premature temporal lobe atrophy. Electronically Signed   By: Tiburcio Pea M.D.   On: 09/30/2022 07:23    Positive ROS: All other systems have been reviewed and were otherwise negative with the exception of those mentioned in the HPI and as above.  Physical Exam: General:  Alert, no acute distress Psychiatric: Patient is alert and oriented to person and the fact that she is at a hospital Cardiovascular:  No pedal edema Respiratory:  No wheezing, non-labored breathing GI:  Abdomen is soft and non-tender Skin:  No lesions in the area of chief complaint Neurologic:  Sensation intact distally Lymphatic:  No axillary or cervical lymphadenopathy  Orthopedic Exam:  Left upper extremity Skin intact with some mild swelling over the shoulder Sensation intact over the entire upper extremity down to the fingertips Pain with any motion of the upper arm No tenderness palpation over the elbow wrist or hand Ain/PIN/R/U/M/Ax nerves intact to function and sensation Hand WWP +Radial pulse Compartments all soft  Secondary  survey No tenderness to palpation over other bony prominences in the lower extremities or bilateral upper extremities except some tenderness over the lateral aspect of the right hip. No pain with logroll or simulated axial loading of the bilateral lower extremity All compartments soft No tenderness to palpation over the cervical or thoracic spine, no bony step-off Motor grossly intact throughout, no focal deficits Sensation grossly intact throughout, no focal deficits Good distal pulses and capillary refill on all extremities   X-rays:  X-rays of the left shoulder images and report reviewed by myself which show a comminuted impacted left proximal humerus fracture with no dislocation.  Agree with radiologist interpretation  CT of the abdomen pelvis images and report reviewed by myself there are no acute appearing bony injuries.  Degenerative changes noted in the lumbosacral spine and bilateral SI joints, and the bilateral hips with some osteophyte formation and sclerosis.  No fractures or dislocations noted in the hips or pelvis.  Agree with radiology interpretation.  Assessment: Left proximal humerus fracture, right hip contusion  Plan: I reviewed the clinical and radiographic findings with the patient and her husband and discussed the history and natural course of healing of proximal humerus fractures.  We discussed nonoperative and operative treatment options and the risks and benefits of both.  At this time the patient and her husband would like to proceed with a nonoperative approach to treatment with a sling for comfort and no weightbearing to the left upper extremity.  We discussed the risk of nonunion, malunion, dislocation, nerve injury, persistent pain, possible need for future surgical intervention.  All questions answered and the patient and husband agree with the plan to proceed with nonoperative management of her left proximal humerus fracture and right hip contusion.  She will be  nonweightbearing left upper extremity and weightbearing as tolerated bilateral lower extremities.  Patient may follow-up in the [facility] clinic orthopedic division in 1 to 2 weeks for repeat x-rays of her left shoulder.    Reinaldo Berber MD  Beeper #:  [phone]  10/01/2022 10:05 AM

## 2022-10-01 NOTE — Evaluation (Signed)
Physical Therapy Evaluation Patient Details Name: Deanna Joseph MRN: [id number] DOB: 1966/04/12 Today's Date: 10/01/2022  History of Present Illness  Pt is a 57 y.o. female with past medical history of Bell's palsy, DM II, GERD, and Warnicke's encephalopathy who presents because of multiple falls found to have a left proximal humerus fracture after a fall.  MD assessment includes: comminuted impacted left proximal humerus fracture to be managed conservatively, R hip pain but no fractures or dislocations noted in the hips or pelvis, syncope, and UTI.   Clinical Impression  Pt was pleasant and motivated to participate during the session and put forth good effort throughout. Pt able to follow 1-step commands with extra time and cuing but presented with poor carryover/STM and required frequent reminders for LUE WB compliance.  Pt required physical assistance with all functional tasks and required multiple attempts to come to full upright standing.  Once in standing the pt was able to take several very small, effortful steps with a HW near the EOB and to the chair.  No adverse symptoms reported other than R hip and LUE pain with SpO2 and HR WNL on room air.  Pt will benefit from continued PT services upon discharge to safely address deficits listed in patient problem list for decreased caregiver assistance and eventual return to PLOF.         Recommendations for follow up therapy are one component of a multi-disciplinary discharge planning process, led by the attending physician.  Recommendations may be updated based on patient status, additional functional criteria and insurance authorization.  Follow Up Recommendations Can patient physically be transported by private vehicle: No     Assistance Recommended at Discharge Frequent or constant Supervision/Assistance  Patient can return home with the following  Two people to help with walking and/or transfers;A lot of help with  bathing/dressing/bathroom;Assistance with cooking/housework;Assist for transportation    Equipment Recommendations Other (comment) (TBD at next venue of care)  Recommendations for Other Services       Functional Status Assessment Patient has had a recent decline in their functional status and demonstrates the ability to make significant improvements in function in a reasonable and predictable amount of time.     Precautions / Restrictions Precautions Precautions: Fall Required Braces or Orthoses: Sling Restrictions Weight Bearing Restrictions: Yes LUE Weight Bearing: Non weight bearing RLE Weight Bearing: Weight bearing as tolerated LLE Weight Bearing: Weight bearing as tolerated Other Position/Activity Restrictions: LUE sling for comfort      Mobility  Bed Mobility Overal bed mobility: Needs Assistance Bed Mobility: Rolling, Sidelying to Sit Rolling: Mod assist Sidelying to sit: Mod assist       General bed mobility comments: Mod A for BLE and trunk control during log roll training to the R    Transfers Overall transfer level: Needs assistance Equipment used: Hemi-walker Transfers: Sit to/from Stand Sit to Stand: Min assist, +2 physical assistance, +2 safety/equipment, From elevated surface           General transfer comment: Mod verbal cues for sequencing with frequent cuing to prevent pt from attempting to use her LUE    Ambulation/Gait Ambulation/Gait assistance: Min assist Gait Distance (Feet): 4 Feet Assistive device: Hemi-walker Gait Pattern/deviations: Step-to pattern, Decreased stance time - right, Decreased step length - left, Antalgic Gait velocity: decreased     General Gait Details: Very antalgic on the RLE but able to take several steps at the EOB and to turn 90 deg to the chair with cues for step-to  sequencing with the [facility] for pain control, min A to guide the [facility]  Stairs            Wheelchair Mobility    Modified Rankin (Stroke Patients  Only)       Balance Overall balance assessment: Needs assistance, History of Falls Sitting-balance support: Single extremity supported Sitting balance-Leahy Scale: Fair     Standing balance support: Single extremity supported, During functional activity, Reliant on assistive device for balance Standing balance-Leahy Scale: Fair                               Pertinent Vitals/Pain Pain Assessment Pain Assessment: 0-10 Pain Score: 8  Pain Location: R hip and LUE Pain Descriptors / Indicators: Aching, Sore Pain Intervention(s): Repositioned, Premedicated before session, Monitored during session    Home Living Family/patient expects to be discharged to:: Private residence Living Arrangements: Spouse/significant other Available Help at Discharge: Family;Available PRN/intermittently Type of Home: Other(Comment) ([employer]) Home Access: Level entry       Home Layout: One level Home Equipment: Rollator (4 wheels);Shower seat Additional Comments: Toilet riser with arm rests but is not a BSC, bolts to the toilet    Prior Function Prior Level of Function : Needs assist             Mobility Comments: Ind amb community distances without an AD limited community distances, occasionally uses rollator as a w/c for community access, two falls in the last 6 months including current fall ADLs Comments: Ind with dressing, spouse assists with bathing, sponge bathes at times     Hand Dominance   Dominant Hand: Right    Extremity/Trunk Assessment   Upper Extremity Assessment Upper Extremity Assessment: Generalized weakness    Lower Extremity Assessment Lower Extremity Assessment: Generalized weakness;RLE deficits/detail RLE: Unable to fully assess due to pain       Communication   Communication: No difficulties  Cognition Arousal/Alertness: Awake/alert Behavior During Therapy: WFL for tasks assessed/performed Overall Cognitive Status: History of cognitive impairments  - at baseline                                 General Comments: Able to follow commands with extra time and cuing but very poor STM with frequent reminders required        General Comments  Extensive education provided on proper sequencing during all functional tasks to ensure LUE NWB compliance    Exercises     Assessment/Plan    PT Assessment Patient needs continued PT services  PT Problem List Decreased strength;Decreased activity tolerance;Decreased balance;Decreased mobility;Decreased cognition;Decreased knowledge of use of DME;Decreased safety awareness;Decreased knowledge of precautions;Pain       PT Treatment Interventions DME instruction;Gait training;Functional mobility training;Therapeutic activities;Therapeutic exercise;Balance training;Patient/family education    PT Goals (Current goals can be found in the Care Plan section)  Acute Rehab PT Goals Patient Stated Goal: To be able to walk into church PT Goal Formulation: With patient Time For Goal Achievement: 10/14/22 Potential to Achieve Goals: Good    Frequency 7X/week     Co-evaluation               AM-PAC PT "6 Clicks" Mobility  Outcome Measure Help needed turning from your back to your side while in a flat bed without using bedrails?: A Lot Help needed moving from lying on your back to sitting on the  side of a flat bed without using bedrails?: A Lot Help needed moving to and from a bed to a chair (including a wheelchair)?: A Lot Help needed standing up from a chair using your arms (e.g., wheelchair or bedside chair)?: A Lot Help needed to walk in hospital room?: Total Help needed climbing 3-5 steps with a railing? : Total 6 Click Score: 10    End of Session Equipment Utilized During Treatment: Gait belt;Other (comment) (LUE sling) Activity Tolerance: Patient tolerated treatment well Patient left: in chair;with call bell/phone within reach;with chair alarm set;with nursing/sitter in  room;with family/visitor present;with SCD's reapplied Nurse Communication: Mobility status;Weight bearing status PT Visit Diagnosis: History of falling (Z91.81);Unsteadiness on feet (R26.81);Difficulty in walking, not elsewhere classified (R26.2);Muscle weakness (generalized) (M62.81);Pain Pain - Right/Left: Left Pain - part of body: Shoulder (and R hip)    Time: 8119-1478 PT Time Calculation (min) (ACUTE ONLY): 40 min   Charges:   PT Evaluation $PT Eval Moderate Complexity: 1 Mod PT Treatments $Therapeutic Activity: 8-22 mins       D. Elly Modena PT, DPT 10/01/22, 12:26 PM

## 2022-10-01 NOTE — Evaluation (Signed)
Occupational Therapy Evaluation Patient Details Name: Deanna Joseph MRN: [id number] DOB: 1965-07-01 Today's Date: 10/01/2022   History of Present Illness Pt is a 57 y.o. female with past medical history of Bell's palsy, DM II, GERD, and Warnicke's encephalopathy who presents because of multiple falls found to have a left proximal humerus fracture after a fall.  MD assessment includes: comminuted impacted left proximal humerus fracture to be managed conservatively, R hip pain but no fractures or dislocations noted in the hips or pelvis, syncope, and UTI.   Clinical Impression   Pt was seen for OT evaluation this date. Prior to hospital admission, pt was relatively independent. Spouse assisted some with bathing. Pt lives with her spouse in a motel and was occasionally using a rollator as a wheelchair for community distances. Pt presents to acute OT demonstrating impaired ADL performance and functional mobility 2/2 LUE NWBing in sling, R hip pain, decreased strength/ROM, impaired balance, and impaired cognition (See OT problem list). Pt currently requires MAX A for LB ADL tasks, MOD A for UB ADL from seated position. Shoulder sling readjusted for improved fit and comfort. Pt required cues throughout session for NWBing for LUE. Pt attempted to stand from recliner requiring MAX VC for sequencing, anterior weight shift, and ultimately unable to complete due ot pain. RN notified.  Pt would benefit from skilled OT services to address noted impairments and functional limitations (see below for any additional details) in order to maximize safety and independence while minimizing falls risk and caregiver burden.    Recommendations for follow up therapy are one component of a multi-disciplinary discharge planning process, led by the attending physician.  Recommendations may be updated based on patient status, additional functional criteria and insurance authorization.   Assistance Recommended at Discharge  Frequent or constant Supervision/Assistance  Patient can return home with the following Two people to help with walking and/or transfers;A lot of help with bathing/dressing/bathroom;Direct supervision/assist for medications management;Assistance with cooking/housework;Help with stairs or ramp for entrance;Assist for transportation    Functional Status Assessment  Patient has had a recent decline in their functional status and demonstrates the ability to make significant improvements in function in a reasonable and predictable amount of time.  Equipment Recommendations  Other (comment) (defer to next venue of care)    Recommendations for Other Services       Precautions / Restrictions Precautions Precautions: Fall Required Braces or Orthoses: Sling Restrictions Weight Bearing Restrictions: Yes LUE Weight Bearing: Non weight bearing RLE Weight Bearing: Weight bearing as tolerated LLE Weight Bearing: Weight bearing as tolerated Other Position/Activity Restrictions: LUE sling for comfort      Mobility Bed Mobility               General bed mobility comments: NT, in recliner    Transfers                   General transfer comment: Attempted STS from recliner but pt ultimately unable to clear buttocks from seated 2/2 pain      Balance Overall balance assessment: Needs assistance, History of Falls Sitting-balance support: Single extremity supported Sitting balance-Leahy Scale: Fair                                     ADL either performed or assessed with clinical judgement   ADL Overall ADL's : Needs assistance/impaired  General ADL Comments: Pt currently requires MAX A for LB ADL, MOD A for UB ADL, and PRN MIN A for seated grooming and self feeding tasks.     Vision         Perception     Praxis      Pertinent Vitals/Pain Pain Assessment Pain Assessment: 0-10 Pain Score: 9  Pain  Location: R hip and LUE Pain Descriptors / Indicators: Aching, Grimacing, Guarding Pain Intervention(s): Limited activity within patient's tolerance, Monitored during session, Repositioned, Patient requesting pain meds-RN notified     Hand Dominance Right   Extremity/Trunk Assessment Upper Extremity Assessment Upper Extremity Assessment: Generalized weakness;LUE deficits/detail LUE: Unable to fully assess due to immobilization   Lower Extremity Assessment Lower Extremity Assessment: Generalized weakness;RLE deficits/detail RLE: Unable to fully assess due to pain       Communication Communication Communication: No difficulties   Cognition Arousal/Alertness: Awake/alert Behavior During Therapy: WFL for tasks assessed/performed Overall Cognitive Status: History of cognitive impairments - at baseline                                 General Comments: requires +processing time and cues for simple commands     General Comments       Exercises Other Exercises Other Exercises: Pt/spouse instructed in sling mgt and repositioned for improved fit and comfort   Shoulder Instructions      Home Living Family/patient expects to be discharged to:: Private residence Living Arrangements: Spouse/significant other Available Help at Discharge: Family;Available PRN/intermittently Type of Home: Other(Comment) (Motel) Home Access: Level entry     Home Layout: One level     Bathroom Shower/Tub: Chief Strategy Officer: Handicapped height     Home Equipment: Rollator (4 wheels);Shower seat   Additional Comments: Toilet riser with arm rests but is not a BSC, bolts to the toilet      Prior Functioning/Environment Prior Level of Function : Needs assist             Mobility Comments: Ind amb community distances without an AD limited community distances, occasionally uses rollator as a w/c for community access, two falls in the last 6 months including current  fall ADLs Comments: Ind with dressing, spouse assists with bathing, sponge bathes at times        OT Problem List: Decreased strength;Pain;Decreased range of motion;Decreased safety awareness;Impaired balance (sitting and/or standing);Decreased knowledge of use of DME or AE;Impaired UE functional use;Decreased knowledge of precautions      OT Treatment/Interventions: Self-care/ADL training;Therapeutic exercise;Therapeutic activities;DME and/or AE instruction;Patient/family education;Balance training    OT Goals(Current goals can be found in the care plan section) Acute Rehab OT Goals Patient Stated Goal: get better and have less pain OT Goal Formulation: With patient/family Time For Goal Achievement: 10/15/22 Potential to Achieve Goals: Good ADL Goals Pt Will Perform Upper Body Dressing: with caregiver independent in assisting;sitting;with min assist Pt Will Perform Lower Body Dressing: with caregiver independent in assisting;sit to/from stand;with mod assist Pt Will Transfer to Toilet: stand pivot transfer;with min assist Pt Will Perform Toileting - Clothing Manipulation and hygiene: with modified independence  OT Frequency: Min 1X/week    Co-evaluation              AM-PAC OT "6 Clicks" Daily Activity     Outcome Measure Help from another person eating meals?: A Little Help from another person taking care of personal grooming?: A Little Help  from another person toileting, which includes using toliet, bedpan, or urinal?: A Lot Help from another person bathing (including washing, rinsing, drying)?: A Lot Help from another person to put on and taking off regular upper body clothing?: A Lot Help from another person to put on and taking off regular lower body clothing?: A Lot 6 Click Score: 14   End of Session Nurse Communication: Patient requests pain meds;Mobility status  Activity Tolerance: Patient limited by pain Patient left: in chair;with call bell/phone within reach;with  chair alarm set;with family/visitor present  OT Visit Diagnosis: Other abnormalities of gait and mobility (R26.89);Muscle weakness (generalized) (M62.81);Pain Pain - Right/Left: Left Pain - part of body: Arm;Shoulder                Time: 1337-1400 OT Time Calculation (min): 23 min Charges:  OT General Charges $OT Visit: 1 Visit OT Evaluation $OT Eval Moderate Complexity: 1 Mod OT Treatments $Therapeutic Activity: 8-22 mins  Arman Filter., MPH, MS, OTR/L ascom [phone] 10/01/22, 2:37 PM

## 2022-10-01 NOTE — TOC Progression Note (Signed)
Transition of Care [facility]) - Progression Note    Patient Details  Name: Deanna Joseph MRN: [id number] Date of Birth: 10/28/1965  Transition of Care [facility]) CM/SW Contact  Marlowe Sax, RN Phone Number: 10/01/2022, 3:51 PM  Clinical Narrative:    Spoke with the patient, she asked me to speak to her Husband [name]  He stated  that she needs to go to rehab, I explained to him that due to her having Medicaid she would have to pay the difference of what they do not cover, He stated that he can not pay the difference, I explained to go to rehab they would have to cover the difference as Medicaid does not cover at 100%, he stated she has to go because he can not care for her.  I let him know if they can not make a payment plan with the facility he would need to make alternative plans for her because she would have to go home.  I explained that a rehab may set up a payment plan and they could make payments, he asked me to do a bedsearch and get the cost but that he can not pay because they dont have that kind of money I asked him how they have been managing  At home prior to now, he said he has been taking care of her but he can not take care of her now.  They have a wheelchair at home I let him know to be looking at alternative plans because if they can not work out a payment plan with a SNF she would need to go home with him.   He stressed that he was not taking her home.  Bed search completed    Expected Discharge Plan: Skilled Nursing Facility Barriers to Discharge: SNF Pending bed offer  Expected Discharge Plan and Services   Discharge Planning Services: CM Consult   Living arrangements for the past 2 months: Single Family Home                                       Social Determinants of Health (SDOH) Interventions SDOH Screenings   Food Insecurity: No Food Insecurity (09/30/2022)  Housing: Low Risk  (09/30/2022)  Transportation Needs: No Transportation Needs  (09/30/2022)  Utilities: Not At Risk (09/30/2022)  Tobacco Use: Low Risk  (09/30/2022)    Readmission Risk Interventions     No data to display

## 2022-10-01 NOTE — NC FL2 (Signed)
[place] MEDICAID FL2 LEVEL OF CARE FORM     IDENTIFICATION  Patient Name: Deanna Joseph Birthdate: 02/17/1966 Sex: female Admission Date (Current Location): 09/30/2022  [name] Number:  Chiropodist and Address:  [facility], [address], [place], [place] [address]      Provider Number: [id number]  Attending Physician Name and Address:  Darlin Priestly, MD  Relative Name and Phone Number:  [name] [phone]    Current Level of Care: Hospital Recommended Level of Care: Skilled Nursing Facility Prior Approval Number:    Date Approved/Denied:   PASRR Number: Pending  Discharge Plan: SNF    Current Diagnoses: Patient Active Problem List   Diagnosis Date Noted   Contusion of right hip 10/01/2022   Syncope 09/30/2022   UTI (urinary tract infection) 09/30/2022   Humerus fracture 09/30/2022   Cognitive decline 09/30/2022   Protein-calorie malnutrition, severe 07/24/2019   Nausea    Ptosis, left eyelid    Acute gastritis without hemorrhage    Weakness    Type 2 diabetes mellitus with hyperlipidemia (HCC)    Depression    Hypomagnesemia    Gastroparesis 07/18/2019   Intractable vomiting with nausea 07/17/2019   Unintentional weight loss of more than 10 pounds in 90 days 07/17/2019   History of chronic gastritis 07/17/2019   History of Helicobacter pylori infection 07/17/2019   Diet-controlled diabetes mellitus (HCC) 07/17/2019   AKI (acute kidney injury) (HCC) 07/17/2019   Metabolic acidosis 07/17/2019   Hypokalemia 07/17/2019   Hypotension 07/17/2019    Orientation RESPIRATION BLADDER Height & Weight     Self, Time, Situation, Place  Normal Continent Weight: 78.6 kg Height:  5\' 1"  (154.9 cm)  BEHAVIORAL SYMPTOMS/MOOD NEUROLOGICAL BOWEL NUTRITION STATUS  Dangerous to self, others or property   Continent Diet (See DC summary)  AMBULATORY STATUS COMMUNICATION OF NEEDS Skin   Extensive Assist Verbally Normal                        Personal Care Assistance Level of Assistance  Bathing, Feeding, Dressing Bathing Assistance: Maximum assistance Feeding assistance: Limited assistance Dressing Assistance: Maximum assistance Total Care Assistance: Maximum assistance   Functional Limitations Info  Sight, Hearing, Speech Sight Info: Adequate Hearing Info: Adequate Speech Info: Adequate    SPECIAL CARE FACTORS FREQUENCY  PT (By licensed PT), OT (By licensed OT)     PT Frequency: 5 times per week OT Frequency: 5 times per week            Contractures Contractures Info: Not present    Additional Factors Info  Isolation Precautions               Current Medications (10/01/2022):  This is the current hospital active medication list Current Facility-Administered Medications  Medication Dose Route Frequency Provider Last Rate Last Admin   cefTRIAXone (ROCEPHIN) 1 g in sodium chloride 0.9 % 100 mL IVPB  1 g Intravenous Q24H Floydene Flock, MD 200 mL/hr at 10/01/22 0848 1 g at 10/01/22 0848   enoxaparin (LOVENOX) injection 40 mg  40 mg Subcutaneous Q24H Floydene Flock, MD   40 mg at 10/01/22 1248   gabapentin (NEURONTIN) capsule 300 mg  300 mg Oral QHS Floydene Flock, MD   300 mg at 09/30/22 2205   insulin aspart (novoLOG) injection 0-5 Units  0-5 Units Subcutaneous QHS Floydene Flock, MD       insulin aspart (novoLOG) injection  0-9 Units  0-9 Units Subcutaneous TID WC Floydene Flock, MD   1 Units at 10/01/22 1248   morphine (PF) 2 MG/ML injection 1 mg  1 mg Intravenous Q4H PRN Darlin Priestly, MD       multivitamin liquid 15 mL  15 mL Oral Daily Floydene Flock, MD   15 mL at 10/01/22 0833   ondansetron (ZOFRAN) tablet 4 mg  4 mg Oral Q6H PRN Floydene Flock, MD       Or   ondansetron [facility]) injection 4 mg  4 mg Intravenous Q6H PRN Floydene Flock, MD       oxyCODONE (Oxy IR/ROXICODONE) immediate release tablet 5 mg  5 mg Oral Q6H PRN Darlin Priestly, MD   5 mg at 10/01/22 1421    pantoprazole (PROTONIX) EC tablet 40 mg  40 mg Oral Daily Floydene Flock, MD   40 mg at 10/01/22 0829   sodium chloride flush (NS) 0.9 % injection 3 mL  3 mL Intravenous Q12H Floydene Flock, MD   3 mL at 10/01/22 0834   traZODone (DESYREL) tablet 50 mg  50 mg Oral QHS Floydene Flock, MD   50 mg at 09/30/22 2204     Discharge Medications: Please see discharge summary for a list of discharge medications.  Relevant Imaging Results:  Relevant Lab Results:   Additional Information [id number] SS#  Marlowe Sax, RN

## 2022-10-01 NOTE — Progress Notes (Signed)
  PROGRESS NOTE    Deanna Joseph  WUJ:[id number] DOB: 1965/10/27 DOA: 09/30/2022 PCP: Hillery Aldo, MD  [id number]-[id number]  LOS: 0 days   Brief hospital course:   Assessment & Plan: Deanna Joseph is a 57 y.o. female with medical history significant of cognitive decline, type 2 diabetes, urinary incontinence, weakness,?  Warnicke encephalopathy presenting with left humerus fracture, syncope, UTI.  History from the patient as well as husband in setting of cognitive decline.  Per report, patient had a witnessed fall where patient landed on her left shoulder.  Patient was unconscious transiently.  Patient was then awakened by her husband.  Positive mild confusion.  Patient does not remember what happened.  Patient does report having some generalized weakness as well as confusion prior to symptoms.  Patient has been states this is a fairly chronic issue.  Urinary incontinence is also fairly chronic.  Weakness and confusion have been more exacerbated over the past week or so.    * Syncope --MRI brain neg for acute finding.  Echo unremarkable.  Likely due to the fall.  Humerus fracture, left Noted Surgical neck humerus fracture with mild impaction --ortho consulted w/ Dr. Audelia Acton, after discussion, husband elected for nonoperative approach. --sling for comfort and no weightbearing to the left upper extremity.  --follow-up in the [facility] clinic orthopedic division in 1 to 2 weeks for repeat x-rays of her left shoulder.   Right hip contusion  --weightbearing as tolerated bilateral lower extremities.   UTI (urinary tract infection) UA indicative of infection in setting of worsening confusion and syncope  --cont ceftriaxone pending urine cx  Cognitive decline Progressive memory, functional decline, weakness per the husband over multiple years Noted prior diagnosis of ? Wernicke encephalopathy as well as temporal lobe atrophy on CT head   Type 2 diabetes mellitus with hyperlipidemia (HCC) --A1c  7.9. --SSI for now  AKI, ruled out CKD 3a --Cr 1.29 on presentation, did not trend down with IVF.  Last Cr 1.16 back in Oct 2022.  Pt likely has CKD 3a.   DVT prophylaxis: Lovenox SQ Code Status: Full code  Family Communication:  Level of care: Telemetry Medical Dispo:   The patient is from: home Anticipated d/c is to: to be determined Anticipated d/c date is: 1-2 days   Subjective and Interval History:  Temp trending up some.   Objective: Vitals:   09/30/22 1407 09/30/22 2312 10/01/22 0500 10/01/22 0812  BP: 137/65 (!) 154/71  (!) 148/72  Pulse: 83 98  94  Resp: 16 18  18   Temp: 98.1 F (36.7 C) 99.3 F (37.4 C)  100.1 F (37.8 C)  TempSrc:      SpO2: 100% 97%  99%  Weight:   78.6 kg   Height:        Intake/Output Summary (Last 24 hours) at 10/01/2022 1204 Last data filed at 09/30/2022 1920 Gross per 24 hour  Intake 730.86 ml  Output --  Net 730.86 ml   Filed Weights   09/30/22 0630 10/01/22 0500  Weight: 67.6 kg 78.6 kg    Examination:   Constitutional: NAD, alert HEENT: conjunctivae and lids normal, EOMI CV: No cyanosis.   RESP: normal respiratory effort, on RA   Data Reviewed: I have personally reviewed labs and imaging studies  Time spent: 35 minutes  Darlin Priestly, MD [facility] If 7PM-7AM, please contact night-coverage 10/01/2022, 12:04 PM

## 2022-10-02 DIAGNOSIS — R55 Syncope and collapse: Secondary | ICD-10-CM | POA: Diagnosis not present

## 2022-10-02 LAB — GLUCOSE, CAPILLARY
Glucose-Capillary: 138 mg/dL — ABNORMAL HIGH (ref 70–99)
Glucose-Capillary: 144 mg/dL — ABNORMAL HIGH (ref 70–99)
Glucose-Capillary: 154 mg/dL — ABNORMAL HIGH (ref 70–99)
Glucose-Capillary: 118 mg/dL — ABNORMAL HIGH (ref 70–99)
Glucose-Capillary: 136 mg/dL — ABNORMAL HIGH (ref 70–99)

## 2022-10-02 LAB — CBC
HCT: 30.7 % — ABNORMAL LOW (ref 36.0–46.0)
Hemoglobin: 9.9 g/dL — ABNORMAL LOW (ref 12.0–15.0)
MCH: 30 pg (ref 26.0–34.0)
MCHC: 32.2 g/dL (ref 30.0–36.0)
MCV: 93 fL (ref 80.0–100.0)
Platelets: 222 10*3/uL (ref 150–400)
RBC: 3.3 MIL/uL — ABNORMAL LOW (ref 3.87–5.11)
RDW: 12.9 % (ref 11.5–15.5)
WBC: 7 10*3/uL (ref 4.0–10.5)
nRBC: 0 % (ref 0.0–0.2)

## 2022-10-02 LAB — BASIC METABOLIC PANEL
Anion gap: 7 (ref 5–15)
BUN: 19 mg/dL (ref 6–20)
CO2: 23 mmol/L (ref 22–32)
Calcium: 8.6 mg/dL — ABNORMAL LOW (ref 8.9–10.3)
Chloride: 106 mmol/L (ref 98–111)
Creatinine, Ser: 1.39 mg/dL — ABNORMAL HIGH (ref 0.44–1.00)
GFR, Estimated: 45 mL/min — ABNORMAL LOW (ref >60)
Glucose, Bld: 150 mg/dL — ABNORMAL HIGH (ref 70–99)
Potassium: 4.5 mmol/L (ref 3.5–5.1)
Sodium: 136 mmol/L (ref 135–145)

## 2022-10-02 LAB — URINE CULTURE: Culture: >=100000 — AB

## 2022-10-02 LAB — MAGNESIUM: Magnesium: 2.1 mg/dL (ref 1.7–2.4)

## 2022-10-02 MED ORDER — MORPHINE SULFATE (PF) 2 MG/ML IV SOLN
1.0000 mg | INTRAVENOUS | Status: DC | PRN
  Administered 2022-10-02 (×2): 1 mg via INTRAVENOUS
  Filled 2022-10-02 (×3): qty 1
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
## 2022-10-02 NOTE — Progress Notes (Signed)
PROGRESS NOTE    Deanna Joseph  OZH:[id number] DOB: Oct 19, 1965 DOA: 09/30/2022 PCP: Deanna Aldo, MD  158A/158A-AA  LOS: 1 day   Brief hospital course:   Assessment & Plan: Deanna Joseph is a 57 y.o. female with medical history significant of cognitive decline, type 2 diabetes, urinary incontinence, weakness,?  Warnicke encephalopathy presenting with left humerus fracture, syncope, UTI.  History from the patient as well as husband in setting of cognitive decline.  Per report, patient had a witnessed fall where patient landed on her left shoulder.  Patient was unconscious transiently.  Patient was then awakened by her husband.  Positive mild confusion.  Patient does not remember what happened.  Patient does report having some generalized weakness as well as confusion prior to symptoms.  Patient has been states this is a fairly chronic issue.  Urinary incontinence is also fairly chronic.  Weakness and confusion have been more exacerbated over the past week or so.    * Syncope --MRI brain neg for acute finding.  Echo unremarkable.  Likely due to the fall.  Humerus fracture, left Noted Surgical neck humerus fracture with mild impaction --ortho consulted w/ Dr. Audelia Acton, after discussion, husband elected for nonoperative approach. --sling for comfort and no weightbearing to the left upper extremity.  --follow-up in the [facility] clinic orthopedic division in 1 to 2 weeks for repeat x-rays of her left shoulder.   Right hip contusion  --weightbearing as tolerated bilateral lower extremities.   UTI (urinary tract infection) 2/2 Klebsiella  UA indicative of infection in setting of worsening confusion and syncope  --completed 3 days of ceftriaxone  Cognitive decline Progressive memory, functional decline, weakness per the husband over multiple years Noted prior diagnosis of ? Wernicke encephalopathy as well as temporal lobe atrophy on CT head   Type 2 diabetes mellitus with hyperlipidemia  (HCC) --A1c 7.9. --SSI for now  AKI, ruled out CKD 3a --Cr 1.29 on presentation, did not trend down with IVF.  Last Cr 1.16 back in Oct 2022.  Pt likely has CKD 3a.   DVT prophylaxis: Lovenox SQ Code Status: Full code  Family Communication:  Level of care: Med-Surg Dispo:   The patient is from: home Anticipated d/c is to: SNF Anticipated d/c date is: whenever bed available   Subjective and Interval History:  Pain med helped some with arm pain.   Objective: Vitals:   10/01/22 1534 10/02/22 0025 10/02/22 0500 10/02/22 0759  BP: (!) 158/69 (!) 160/71  (!) 148/64  Pulse: 91 (!) 102  99  Resp: (!) 21 18  18   Temp: 99.9 F (37.7 C) 99.7 F (37.6 C)  99.3 F (37.4 C)  TempSrc:      SpO2: 100% 98%  100%  Weight:   80.1 kg   Height:        Intake/Output Summary (Last 24 hours) at 10/02/2022 1518 Last data filed at 10/02/2022 1429 Gross per 24 hour  Intake 483 ml  Output 700 ml  Net -217 ml   Filed Weights   09/30/22 0630 10/01/22 0500 10/02/22 0500  Weight: 67.6 kg 78.6 kg 80.1 kg    Examination:   Constitutional: NAD, alert, oriented to person and place HEENT: conjunctivae and lids normal, EOMI CV: No cyanosis.   RESP: normal respiratory effort, on RA Neuro: II - XII grossly intact.   Psych: Normal mood and affect.     Data Reviewed: I have personally reviewed labs and imaging studies  Time spent: 25 minutes  Inetta Fermo  Fran Lowes, MD [facility] If 7PM-7AM, please contact night-coverage 10/02/2022, 3:18 PM

## 2022-10-03 DIAGNOSIS — R55 Syncope and collapse: Secondary | ICD-10-CM | POA: Diagnosis not present

## 2022-10-03 LAB — GLUCOSE, CAPILLARY
Glucose-Capillary: 133 mg/dL — ABNORMAL HIGH (ref 70–99)
Glucose-Capillary: 144 mg/dL — ABNORMAL HIGH (ref 70–99)
Glucose-Capillary: 156 mg/dL — ABNORMAL HIGH (ref 70–99)
Glucose-Capillary: 141 mg/dL — ABNORMAL HIGH (ref 70–99)

## 2022-10-03 MED ORDER — ORAL CARE MOUTH RINSE: 15.0000 mL | OROMUCOSAL | Status: DC | PRN

## 2022-10-03 NOTE — Progress Notes (Signed)
PROGRESS NOTE    Deanna Joseph  QIO:[id number] DOB: 25-Apr-1966 DOA: 09/30/2022 PCP: Hillery Aldo, MD  158A/158A-AA  LOS: 2 days   Brief hospital course:   Assessment & Plan: Deanna Joseph is a 57 y.o. female with medical history significant of cognitive decline, type 2 diabetes, urinary incontinence, weakness,?  Warnicke encephalopathy presenting with left humerus fracture, syncope, UTI.  History from the patient as well as husband in setting of cognitive decline.  Per report, patient had a witnessed fall where patient landed on her left shoulder.  Patient was unconscious transiently.  Patient was then awakened by her husband.  Positive mild confusion.  Patient does not remember what happened.  Patient does report having some generalized weakness as well as confusion prior to symptoms.  Patient has been states this is a fairly chronic issue.  Urinary incontinence is also fairly chronic.  Weakness and confusion have been more exacerbated over the past week or so.    * Syncope --MRI brain neg for acute finding.  Echo unremarkable.  Likely due to the fall.  Humerus fracture, left Noted Surgical neck humerus fracture with mild impaction --ortho consulted w/ Dr. Audelia Acton, after discussion, husband elected for nonoperative approach. --sling for comfort and no weightbearing to the left upper extremity.  --oxycodone PRN --follow-up in the [facility] clinic orthopedic division in 1 to 2 weeks for repeat x-rays of her left shoulder.   Right hip contusion  --weightbearing as tolerated bilateral lower extremities.   UTI (urinary tract infection) 2/2 Klebsiella  UA indicative of infection in setting of worsening confusion and syncope  --completed 3 days of ceftriaxone  Cognitive decline Progressive memory, functional decline, weakness per the husband over multiple years Noted prior diagnosis of ? Wernicke encephalopathy as well as temporal lobe atrophy on CT head  --delirium precautions.     Type 2 diabetes mellitus with hyperlipidemia (HCC) --A1c 7.9.  No hypoglycemics on home met list. --d/c BG checks and SSI since BG have been within inpatient goals.  AKI, ruled out CKD 3a --Cr 1.29 on presentation, did not trend down with IVF.  Last Cr 1.16 back in Oct 2022.  Pt likely has CKD 3a.   DVT prophylaxis: Lovenox SQ Code Status: Full code  Family Communication:  Level of care: Med-Surg Dispo:   The patient is from: home Anticipated d/c is to: SNF Anticipated d/c date is: whenever bed available   Subjective and Interval History:  Pt reported arm sore, but improved from prior.  Pt knows that she forgets things.   Objective: Vitals:   10/02/22 2015 10/02/22 2328 10/03/22 0414 10/03/22 0839  BP: (!) 170/69 (!) 163/69 (!) 159/66 (!) 129/59  Pulse: 95 100 97 95  Resp: 16 16 16 18   Temp: 98.6 F (37 C) 98.8 F (37.1 C) 98.7 F (37.1 C) 98.7 F (37.1 C)  TempSrc:      SpO2: 100% 98% 98% 99%  Weight:      Height:        Intake/Output Summary (Last 24 hours) at 10/03/2022 1435 Last data filed at 10/03/2022 1100 Gross per 24 hour  Intake 480 ml  Output 850 ml  Net -370 ml   Filed Weights   09/30/22 0630 10/01/22 0500 10/02/22 0500  Weight: 67.6 kg 78.6 kg 80.1 kg    Examination:   Constitutional: NAD, alert, oriented to person and place HEENT: conjunctivae and lids normal, EOMI CV: No cyanosis.   RESP: normal respiratory effort, on RA Extremities: sling over  left arm SKIN: warm, dry Neuro: II - XII grossly intact.   Psych: Normal mood and affect.     Data Reviewed: I have personally reviewed labs and imaging studies  Time spent: 35 minutes  Darlin Priestly, MD [facility] If 7PM-7AM, please contact night-coverage 10/03/2022, 2:35 PM

## 2022-10-03 NOTE — Progress Notes (Signed)
Physical Therapy Treatment Patient Details Name: Deanna Joseph MRN: [id number] DOB: [birth year] Today's Date: 10/03/2022   History of Present Illness Pt is a 57 y.o. female with past medical history of Bell's palsy, DM II, GERD, and Warnicke's encephalopathy who presents because of multiple falls found to have a left proximal humerus fracture after a fall.  MD assessment includes: comminuted impacted left proximal humerus fracture to be managed conservatively, R hip pain but no fractures or dislocations noted in the hips or pelvis, syncope, and UTI.    PT Comments    In chair ready to return to bed after being up all morning.  She is able to stand to [facility] with min assist and increased time.  Steps to bed and remains standing for weight shifting and standing AROM with HW support.  After short seated rest she has 2 trials of sidesteps left and right along bed with HW support and min guard.  Overall increased confidence and more comfortable/less pain with sidesteps than with forward gait.  Returned to supine with mod a x 1 for LE's and verbal cues for sequencing.  Overall good progression today.  Continues to need almost constant cues not to WB on LUE with mobility.     Recommendations for follow up therapy are one component of a multi-disciplinary discharge planning process, led by the attending physician.  Recommendations may be updated based on patient status, additional functional criteria and insurance authorization.  Follow Up Recommendations       Assistance Recommended at Discharge Frequent or constant Supervision/Assistance  Patient can return home with the following Direct supervision/assist for medications management;Direct supervision/assist for financial management;Assist for transportation;Help with stairs or ramp for entrance;A little help with walking and/or transfers;A little help with bathing/dressing/bathroom   Equipment Recommendations  Other (comment) (Defer to next level of  care)    Recommendations for Other Services       Precautions / Restrictions Precautions Precautions: Fall Required Braces or Orthoses: Sling Restrictions Weight Bearing Restrictions: Yes LUE Weight Bearing: Non weight bearing RLE Weight Bearing: Weight bearing as tolerated LLE Weight Bearing: Weight bearing as tolerated     Mobility  Bed Mobility Overal bed mobility: Needs Assistance Bed Mobility: Sit to Supine       Sit to supine: Min assist, Mod assist   General bed mobility comments: cues    Transfers Overall transfer level: Needs assistance Equipment used: Hemi-walker Transfers: Sit to/from Stand Sit to Stand: Min assist           General transfer comment: increased time but improvement noted    Ambulation/Gait Ambulation/Gait assistance: Min assist, Min guard Gait Distance (Feet): 8 Feet Assistive device: Hemi-walker Gait Pattern/deviations: Step-to pattern Gait velocity: decreased   Pre-gait activities: weight shifting and standing AROM BLE - hesitant to WB RLE but more confident after session General Gait Details: sidesteps left/right along bed x 4   Stairs             Wheelchair Mobility    Modified Rankin (Stroke Patients Only)       Balance Overall balance assessment: Needs assistance, History of Falls Sitting-balance support: Feet supported Sitting balance-Leahy Scale: Fair Sitting balance - Comments: sat EOB and perform several exercises with supervision   Standing balance support: Single extremity supported, During functional activity, Reliant on assistive device for balance Standing balance-Leahy Scale: Fair Standing balance comment: pt is very cautious with all standing activity but slow moving with poor overall awareness and safety  Cognition Arousal/Alertness: Awake/alert Behavior During Therapy: WFL for tasks assessed/performed                                    General Comments: requires +processing time and cues for simple commands. Poor insight of situation. constant vcs for NWB UE. slow processing with poor short term recall.        Exercises Other Exercises Other Exercises: standing AROM with HW support    General Comments        Pertinent Vitals/Pain Pain Assessment Pain Assessment: Faces Faces Pain Scale: Hurts little more Pain Location: R hip and LUE Pain Descriptors / Indicators: Aching, Grimacing, Guarding, Discomfort Pain Intervention(s): Limited activity within patient's tolerance, Monitored during session, Repositioned    Home Living                          Prior Function            PT Goals (current goals can now be found in the care plan section) Progress towards PT goals: Progressing toward goals    Frequency    7X/week      PT Plan Current plan remains appropriate    Co-evaluation              AM-PAC PT "6 Clicks" Mobility   Outcome Measure  Help needed turning from your back to your side while in a flat bed without using bedrails?: A Lot Help needed moving from lying on your back to sitting on the side of a flat bed without using bedrails?: A Lot Help needed moving to and from a bed to a chair (including a wheelchair)?: A Little Help needed standing up from a chair using your arms (e.g., wheelchair or bedside chair)?: A Little Help needed to walk in hospital room?: A Lot Help needed climbing 3-5 steps with a railing? : A Lot 6 Click Score: 14    End of Session Equipment Utilized During Treatment: Gait belt Activity Tolerance: Patient tolerated treatment well Patient left: in bed;with call bell/phone within reach;with bed alarm set Nurse Communication: Mobility status;Weight bearing status PT Visit Diagnosis: History of falling (Z91.81);Unsteadiness on feet (R26.81);Difficulty in walking, not elsewhere classified (R26.2);Muscle weakness (generalized) (M62.81);Pain Pain -  Right/Left: Left Pain - part of body: Shoulder (hip)     Time: 1610-9604 PT Time Calculation (min) (ACUTE ONLY): 20 min  Charges:  $Gait Training: 8-22 mins                   Danielle Dess, PTA 10/03/22, 2:30 PM

## 2022-10-03 NOTE — Plan of Care (Signed)

## 2022-10-04 DIAGNOSIS — R55 Syncope and collapse: Secondary | ICD-10-CM | POA: Diagnosis not present

## 2022-10-04 NOTE — Progress Notes (Signed)
PROGRESS NOTE    Deanna Joseph  ZOX:[id number] DOB: 06-22-1965 DOA: 09/30/2022 PCP: Hillery Aldo, MD  158A/158A-AA  LOS: 3 days   Brief hospital course:   Assessment & Plan: Deanna Joseph is a 57 y.o. female with medical history significant of cognitive decline, type 2 diabetes, urinary incontinence, weakness,?  Warnicke encephalopathy presenting with left humerus fracture, syncope, UTI.  History from the patient as well as husband in setting of cognitive decline.  Per report, patient had a witnessed fall where patient landed on her left shoulder.  Patient was unconscious transiently.  Patient was then awakened by her husband.  Positive mild confusion.  Patient does not remember what happened.  Patient does report having some generalized weakness as well as confusion prior to symptoms.  Patient has been states this is a fairly chronic issue.  Urinary incontinence is also fairly chronic.  Weakness and confusion have been more exacerbated over the past week or so.    * Syncope --MRI brain neg for acute finding.  Echo unremarkable.  Likely due to the fall.  Humerus fracture, left Noted Surgical neck humerus fracture with mild impaction --ortho consulted w/ Dr. Audelia Acton, after discussion, husband elected for nonoperative approach. --sling for comfort and no weightbearing to the left upper extremity.  --oxycodone PRN --follow-up in the [facility] clinic orthopedic division in 1 to 2 weeks for repeat x-rays of her left shoulder.   Right hip contusion  --weightbearing as tolerated bilateral lower extremities.   UTI (urinary tract infection) 2/2 Klebsiella  UA indicative of infection in setting of worsening confusion and syncope  --completed 3 days of ceftriaxone  Cognitive decline Progressive memory, functional decline, weakness per the husband over multiple years Noted prior diagnosis of ? Wernicke encephalopathy as well as temporal lobe atrophy on CT head  --delirium precautions.     Type 2 diabetes mellitus with hyperlipidemia (HCC) --A1c 7.9.  No hypoglycemics on home met list. --d/c'ed BG checks and SSI since BG have been within inpatient goals.  AKI, ruled out CKD 3a --Cr 1.29 on presentation, did not trend down with IVF.  Last Cr 1.16 back in Oct 2022.  Pt likely has CKD 3a.   DVT prophylaxis: Lovenox SQ Code Status: Full code  Family Communication:  Level of care: Med-Surg Dispo:   The patient is from: home Anticipated d/c is to: SNF Anticipated d/c date is: whenever bed available   Subjective and Interval History:  Pt reported her behind was sore from sitting in recliner, but otherwise no new complaint.   Objective: Vitals:   10/04/22 0048 10/04/22 0500 10/04/22 0810 10/04/22 1618  BP: (!) 142/68  (!) 153/71 (!) 143/59  Pulse: (!) 101  (!) 101 99  Resp: 18  18 18   Temp: 99 F (37.2 C)  98.3 F (36.8 C) 99.8 F (37.7 C)  TempSrc:      SpO2: 100%  100% 100%  Weight:  76.4 kg    Height:        Intake/Output Summary (Last 24 hours) at 10/04/2022 1820 Last data filed at 10/04/2022 1740 Gross per 24 hour  Intake 240 ml  Output 800 ml  Net -560 ml   Filed Weights   10/01/22 0500 10/02/22 0500 10/04/22 0500  Weight: 78.6 kg 80.1 kg 76.4 kg    Examination:   Constitutional: NAD, alert, oriented to person and place, cheerful  HEENT: conjunctivae and lids normal, EOMI CV: No cyanosis.   RESP: normal respiratory effort, on RA Neuro: II -  XII grossly intact.   Psych: Normal mood and affect.     Data Reviewed: I have personally reviewed labs and imaging studies  Time spent: 25 minutes  Darlin Priestly, MD [facility] If 7PM-7AM, please contact night-coverage 10/04/2022, 6:20 PM

## 2022-10-04 NOTE — Progress Notes (Addendum)
Physical Therapy Treatment Patient Details Name: Deanna Joseph MRN: [id number] DOB: 1965-09-29 Today's Date: 10/04/2022   History of Present Illness Pt is a 57 y.o. female with past medical history of Bell's palsy, DM II, GERD, and Warnicke's encephalopathy who presents because of multiple falls found to have a left proximal humerus fracture after a fall.  MD assessment includes: comminuted impacted left proximal humerus fracture to be managed conservatively, R hip pain but no fractures or dislocations noted in the hips or pelvis, syncope, and UTI.    PT Comments    Mod a x 1 with cues to transition to sitting EOB.  Sling adjusted.  Stood and transferred to/from [employer] with min guard/assist +1 with good steps today.  Voided with max a for care.  Standing ex with HW support and increased ROM noted BLE's.  She is able to progress gait with HW in room about 8' in room with a good turn.  She was limited to sidesteps yesterday but did well with forward gait today with less pain. Remained in chair after session. Continues to need cues not to use LUE with activity despite sling use.   Recommendations for follow up therapy are one component of a multi-disciplinary discharge planning process, led by the attending physician.  Recommendations may be updated based on patient status, additional functional criteria and insurance authorization.  Follow Up Recommendations       Assistance Recommended at Discharge Frequent or constant Supervision/Assistance  Patient can return home with the following Direct supervision/assist for medications management;Direct supervision/assist for financial management;Assist for transportation;Help with stairs or ramp for entrance;A little help with walking and/or transfers;A little help with bathing/dressing/bathroom   Equipment Recommendations       Recommendations for Other Services       Precautions / Restrictions Precautions Precautions: Fall Required Braces or  Orthoses: Sling Restrictions Weight Bearing Restrictions: Yes LUE Weight Bearing: Non weight bearing RLE Weight Bearing: Weight bearing as tolerated LLE Weight Bearing: Weight bearing as tolerated     Mobility  Bed Mobility Overal bed mobility: Needs Assistance Bed Mobility: Supine to Sit   Sidelying to sit: Mod assist            Transfers Overall transfer level: Needs assistance Equipment used: Hemi-walker Transfers: Sit to/from Stand Sit to Stand: Min assist           General transfer comment: increased time but improvement noted    Ambulation/Gait Ambulation/Gait assistance: Min assist, Min guard Gait Distance (Feet): 8 Feet Assistive device: Rolling walker (2 wheels) Gait Pattern/deviations: Step-to pattern Gait velocity: decreased     General Gait Details: able to progress today to forward gait in room with good turns   Stairs             Wheelchair Mobility    Modified Rankin (Stroke Patients Only)       Balance Overall balance assessment: Needs assistance, History of Falls Sitting-balance support: Feet supported Sitting balance-Leahy Scale: Fair Sitting balance - Comments: sat EOB and perform several exercises with supervision   Standing balance support: Single extremity supported, During functional activity, Reliant on assistive device for balance Standing balance-Leahy Scale: Fair Standing balance comment: pt is very cautious with all standing activity but slow moving.  improving safety awareness but still needs mod cues                            Cognition Arousal/Alertness: Awake/alert   Overall Cognitive  Status: History of cognitive impairments - at baseline                                          Exercises Other Exercises Other Exercises: standing AROM with HW support, BSC to void    General Comments        Pertinent Vitals/Pain Pain Assessment Pain Assessment: Faces Faces Pain Scale:  Hurts even more Pain Location: R hip and LUE Pain Descriptors / Indicators: Aching, Grimacing, Guarding, Discomfort Pain Intervention(s): Limited activity within patient's tolerance, Monitored during session, Repositioned    Home Living                          Prior Function            PT Goals (current goals can now be found in the care plan section) Progress towards PT goals: Progressing toward goals    Frequency    7X/week      PT Plan Current plan remains appropriate    Co-evaluation              AM-PAC PT "6 Clicks" Mobility   Outcome Measure  Help needed turning from your back to your side while in a flat bed without using bedrails?: A Lot Help needed moving from lying on your back to sitting on the side of a flat bed without using bedrails?: A Lot Help needed moving to and from a bed to a chair (including a wheelchair)?: A Little Help needed standing up from a chair using your arms (e.g., wheelchair or bedside chair)?: A Little Help needed to walk in hospital room?: A Little Help needed climbing 3-5 steps with a railing? : A Lot 6 Click Score: 15    End of Session Equipment Utilized During Treatment: Gait belt Activity Tolerance: Patient tolerated treatment well Patient left: in chair;with call bell/phone within reach;with chair alarm set Nurse Communication: Mobility status;Weight bearing status PT Visit Diagnosis: History of falling (Z91.81);Unsteadiness on feet (R26.81);Difficulty in walking, not elsewhere classified (R26.2);Muscle weakness (generalized) (M62.81);Pain Pain - Right/Left: Left Pain - part of body: Shoulder     Time: 1610-9604 PT Time Calculation (min) (ACUTE ONLY): 29 min  Charges:  $Gait Training: 8-22 mins $Therapeutic Activity: 8-22 mins                   Danielle Dess, PTA 10/04/22, 11:05 AM

## 2022-10-04 NOTE — TOC Progression Note (Signed)
Transition of Care [facility]) - Progression Note    Patient Details  Name: Deanna Joseph MRN: [id number] Date of Birth: 1965-07-23  [facility]) CM/SW Contact  Marlowe Sax, RN Phone Number: 10/04/2022, 2:32 PM  Clinical Narrative:    No bed offers at this time Resent out all of the considering or pending bed offers. Will review bed offers once obtianed   Expected Discharge Plan: Skilled Nursing Facility Barriers to Discharge: SNF Pending bed offer  Expected Discharge Plan and Services   Discharge Planning Services: CM Consult   Living arrangements for the past 2 months: Single Family Home                                       Social Determinants of Health (SDOH) Interventions SDOH Screenings   Food Insecurity: No Food Insecurity (09/30/2022)  Housing: Low Risk  (09/30/2022)  Transportation Needs: No Transportation Needs (09/30/2022)  Utilities: Not At Risk (09/30/2022)  Tobacco Use: Low Risk  (09/30/2022)    Readmission Risk Interventions     No data to display

## 2022-10-04 NOTE — Progress Notes (Signed)
OT Cancellation Note  Patient Details Name: Deanna Joseph MRN: [id number] DOB: 1966/02/14   Cancelled Treatment:    Reason Eval/Treat Not Completed: Pain limiting ability to participate;Fatigue/lethargy limiting ability to participate. Pt received in bed with spouse present. Pt reported fatigue from just completing transfer back to bed and endorsed 9/10 pain in LUE. RN notified pt requesting pain meds. Will re-attempt at later date/time.  Gerrie Nordmann 10/04/2022, 4:07 PM

## 2022-10-05 DIAGNOSIS — R55 Syncope and collapse: Secondary | ICD-10-CM | POA: Diagnosis not present

## 2022-10-05 NOTE — Progress Notes (Signed)
Physical Therapy Treatment Patient Details Name: Deanna Joseph MRN: [id number] DOB: 1965-06-09 Today's Date: 10/05/2022   History of Present Illness Pt is a 57 y.o. female with past medical history of Bell's palsy, DM II, GERD, and Warnicke's encephalopathy who presents because of multiple falls found to have a left proximal humerus fracture after a fall.  MD assessment includes: comminuted impacted left proximal humerus fracture to be managed conservatively, R hip pain but no fractures or dislocations noted in the hips or pelvis, syncope, and UTI.    PT Comments    OOB with min/mod a x 1 due to pain and L UE in sling.  She is steady in sitting and is able to stand and progress gait into hallway with [facility] support. Some cues for [facility] placement but overall does quite well today.  She is able to stand at the sink while I attempt to comb out a large knot in her hair unsucessfully.  Remained in chair after session with needs met.  Will initiate mobility team consult for increased gait on unit.   Recommendations for follow up therapy are one component of a multi-disciplinary discharge planning process, led by the attending physician.  Recommendations may be updated based on patient status, additional functional criteria and insurance authorization.  Follow Up Recommendations       Assistance Recommended at Discharge Frequent or constant Supervision/Assistance  Patient can return home with the following Direct supervision/assist for medications management;Direct supervision/assist for financial management;Assist for transportation;Help with stairs or ramp for entrance;A little help with walking and/or transfers;A little help with bathing/dressing/bathroom   Equipment Recommendations       Recommendations for Other Services       Precautions / Restrictions Precautions Precautions: Fall Required Braces or Orthoses: Sling Restrictions Weight Bearing Restrictions: Yes LUE Weight Bearing: Non  weight bearing RLE Weight Bearing: Weight bearing as tolerated LLE Weight Bearing: Weight bearing as tolerated Other Position/Activity Restrictions: LUE sling for comfort     Mobility  Bed Mobility Overal bed mobility: Needs Assistance Bed Mobility: Supine to Sit     Supine to sit: Min assist, Mod assist     General bed mobility comments: increased assist to R side bed due to sling on L side    Transfers Overall transfer level: Needs assistance Equipment used: Hemi-walker Transfers: Sit to/from Stand Sit to Stand: Min guard           General transfer comment: STS from EOB, VC for hand placement/safe technique    Ambulation/Gait Ambulation/Gait assistance: Min guard, Min assist Gait Distance (Feet): 30 Feet Assistive device: Hemi-walker Gait Pattern/deviations: Step-to pattern Gait velocity: decreased     General Gait Details: cues for HW placement but overall improvement in gait and distance today   Stairs             Wheelchair Mobility    Modified Rankin (Stroke Patients Only)       Balance Overall balance assessment: Needs assistance, History of Falls Sitting-balance support: Feet supported Sitting balance-Leahy Scale: Fair     Standing balance support: Single extremity supported, During functional activity, Reliant on assistive device for balance Standing balance-Leahy Scale: Fair                              Cognition Arousal/Alertness: Awake/alert Behavior During Therapy: WFL for tasks assessed/performed Overall Cognitive Status: History of cognitive impairments - at baseline  Exercises Other Exercises Other Exercises: standing at sink for attempts at combing hair - large tangle in back and unable to comb through with hospital provided comb    General Comments        Pertinent Vitals/Pain Pain Assessment Pain Assessment: Faces Faces Pain Scale: Hurts little  more Pain Location: R hip and LUE Pain Descriptors / Indicators: Aching, Grimacing, Guarding, Discomfort Pain Intervention(s): Limited activity within patient's tolerance, Monitored during session, Repositioned    Home Living                          Prior Function            PT Goals (current goals can now be found in the care plan section) Progress towards PT goals: Progressing toward goals    Frequency    7X/week      PT Plan Current plan remains appropriate    Co-evaluation              AM-PAC PT "6 Clicks" Mobility   Outcome Measure  Help needed turning from your back to your side while in a flat bed without using bedrails?: A Lot Help needed moving from lying on your back to sitting on the side of a flat bed without using bedrails?: A Lot Help needed moving to and from a bed to a chair (including a wheelchair)?: A Little Help needed standing up from a chair using your arms (e.g., wheelchair or bedside chair)?: A Little Help needed to walk in hospital room?: A Little Help needed climbing 3-5 steps with a railing? : A Lot 6 Click Score: 15    End of Session Equipment Utilized During Treatment: Gait belt Activity Tolerance: Patient tolerated treatment well Patient left: in chair;with call bell/phone within reach;with chair alarm set Nurse Communication: Mobility status;Weight bearing status PT Visit Diagnosis: History of falling (Z91.81);Unsteadiness on feet (R26.81);Difficulty in walking, not elsewhere classified (R26.2);Muscle weakness (generalized) (M62.81);Pain Pain - Right/Left: Left Pain - part of body: Shoulder     Time: 4098-1191 PT Time Calculation (min) (ACUTE ONLY): 18 min  Charges:  $Gait Training: 8-22 mins                  Danielle Dess, PTA 10/05/22, 1:29 PM

## 2022-10-05 NOTE — Progress Notes (Signed)
  PROGRESS NOTE    CRISSA SOWDER  ZOX:[id number] DOB: 20-Feb-1966 DOA: 09/30/2022 PCP: Hillery Aldo, MD  [id number]/[id number]-AA  LOS: 4 days   Brief hospital course:   Assessment & Plan: Deanna Joseph is a 57 y.o. female with medical history significant of cognitive decline, type 2 diabetes, urinary incontinence, ? Warnicke encephalopathy presenting with left humerus fracture, syncope, UTI.    patient had a witnessed fall where patient landed on her left shoulder.  Patient was unconscious transiently.  Patient was then awakened by her husband.  Positive mild confusion.  Patient does not remember what happened.  Patient does report having some generalized weakness as well as confusion prior to symptoms.     * Syncope --MRI brain neg for acute finding.  Echo unremarkable.  Likely due to the fall.  Humerus fracture, left Noted Surgical neck humerus fracture with mild impaction --ortho consulted w/ Dr. Audelia Acton, after discussion, husband elected for nonoperative approach. --sling for comfort and no weightbearing to the left upper extremity.  --oxycodone PRN --follow-up in the [facility] clinic orthopedic division in 1 to 2 weeks for repeat x-rays of her left shoulder.   Right hip contusion  --weightbearing as tolerated bilateral lower extremities.   UTI (urinary tract infection) 2/2 Klebsiella  UA indicative of infection in setting of worsening confusion and syncope  --completed 3 days of ceftriaxone  Cognitive decline Progressive memory, functional decline, weakness per the husband over multiple years Noted prior diagnosis of ? Wernicke encephalopathy as well as temporal lobe atrophy on CT head  --delirium precautions.    Type 2 diabetes mellitus with hyperlipidemia (HCC) --A1c 7.9.  No hypoglycemics on home met list. --d/c'ed BG checks and SSI since BG have been within inpatient goals.  AKI, ruled out CKD 3a --Cr 1.29 on presentation, did not trend down with IVF.  Last Cr 1.16 back in  Oct 2022.  Pt likely has CKD 3a.   DVT prophylaxis: Lovenox SQ Code Status: Full code  Family Communication:  Level of care: Med-Surg Dispo:   The patient is from: home Anticipated d/c is to: SNF Anticipated d/c date is: whenever bed available   Subjective and Interval History:  No new complaint today.     Objective: Vitals:   10/04/22 1618 10/04/22 2310 10/05/22 0848 10/05/22 1756  BP: (!) 143/59 (!) 155/66 132/61 (!) 151/61  Pulse: 99 (!) 106 97 93  Resp: 18 18 18 18   Temp: 99.8 F (37.7 C) 99.1 F (37.3 C) 98.2 F (36.8 C) 98.1 F (36.7 C)  TempSrc:      SpO2: 100% 97% 100% 100%  Weight:      Height:        Intake/Output Summary (Last 24 hours) at 10/05/2022 1932 Last data filed at 10/05/2022 1852 Gross per 24 hour  Intake --  Output 2450 ml  Net -2450 ml   Filed Weights   10/01/22 0500 10/02/22 0500 10/04/22 0500  Weight: 78.6 kg 80.1 kg 76.4 kg    Examination:   Constitutional: NAD, alert, oriented to person and place HEENT: conjunctivae and lids normal, EOMI CV: No cyanosis.   RESP: normal respiratory effort, on RA Neuro: II - XII grossly intact.   Psych: Normal mood and affect.     Data Reviewed: I have personally reviewed labs and imaging studies  Time spent: 25 minutes  Darlin Priestly, MD [facility] If 7PM-7AM, please contact night-coverage 10/05/2022, 7:32 PM

## 2022-10-05 NOTE — Plan of Care (Signed)
  Problem: Education: Goal: Knowledge of condition and prescribed therapy will improve Outcome: Progressing   Problem: Cardiac: Goal: Will achieve and/or maintain adequate cardiac output Outcome: Progressing   Problem: Physical Regulation: Goal: Complications related to the disease process, condition or treatment will be avoided or minimized Outcome: Progressing   Problem: Education: Goal: Ability to describe self-care measures that may prevent or decrease complications (Diabetes Survival Skills Education) will improve Outcome: Progressing Goal: Individualized Educational Video(s) Outcome: Progressing   Problem: Coping: Goal: Ability to adjust to condition or change in health will improve Outcome: Progressing   Problem: Fluid Volume: Goal: Ability to maintain a balanced intake and output will improve Outcome: Progressing   Problem: Health Behavior/Discharge Planning: Goal: Ability to identify and utilize available resources and services will improve Outcome: Progressing Goal: Ability to manage health-related needs will improve Outcome: Progressing   Problem: Metabolic: Goal: Ability to maintain appropriate glucose levels will improve Outcome: Progressing   Problem: Nutritional: Goal: Maintenance of adequate nutrition will improve Outcome: Progressing Goal: Progress toward achieving an optimal weight will improve Outcome: Progressing   Problem: Skin Integrity: Goal: Risk for impaired skin integrity will decrease Outcome: Progressing

## 2022-10-05 NOTE — TOC Progression Note (Signed)
Transition of Care [facility]) - Progression Note    Patient Details  Name: Deanna Joseph MRN: [id number] Date of Birth: Dec 26, 1965  [facility] CM/SW Contact  Marlowe Sax, RN Phone Number: 10/05/2022, 3:18 PM  Clinical Narrative:    NO bed offers, resent out   Expected Discharge Plan: Skilled Nursing Facility Barriers to Discharge: SNF Pending bed offer  Expected Discharge Plan and Services   Discharge Planning Services: CM Consult   Living arrangements for the past 2 months: Single Family Home                                       Social Determinants of Health (SDOH) Interventions SDOH Screenings   Food Insecurity: No Food Insecurity (09/30/2022)  Housing: Low Risk  (09/30/2022)  Transportation Needs: No Transportation Needs (09/30/2022)  Utilities: Not At Risk (09/30/2022)  Tobacco Use: Low Risk  (09/30/2022)    Readmission Risk Interventions     No data to display

## 2022-10-05 NOTE — Progress Notes (Signed)
Occupational Therapy Treatment Patient Details Name: Deanna Joseph MRN: [id number] DOB: 03-31-1966 Today's Date: 10/05/2022   History of present illness Pt is a 57 y.o. female with past medical history of Bell's palsy, DM II, GERD, and Warnicke's encephalopathy who presents because of multiple falls found to have a left proximal humerus fracture after a fall.  MD assessment includes: comminuted impacted left proximal humerus fracture to be managed conservatively, R hip pain but no fractures or dislocations noted in the hips or pelvis, syncope, and UTI.   OT comments  Patient received semi-reclined in bed and agreeable to OT. Pt required Min A to come to EOB this date. Once sitting at EOB, pt engaged in UB dressing and grooming tasks. She required Mod A to don/doff gown and Max A to don/doff LUE sling. Frequent VC provided t/o session for sequencing of tasks, overall safety awareness, and to maintain LUE precautions. Pt left as received with all needs in reach. Pt is making progress toward goal completion. D/C recommendation remains appropriate. OT will continue to follow acutely.    Recommendations for follow up therapy are one component of a multi-disciplinary discharge planning process, led by the attending physician.  Recommendations may be updated based on patient status, additional functional criteria and insurance authorization.    Assistance Recommended at Discharge Frequent or constant Supervision/Assistance  Patient can return home with the following  A lot of help with bathing/dressing/bathroom;Direct supervision/assist for medications management;Assistance with cooking/housework;Help with stairs or ramp for entrance;Assist for transportation;A little help with walking and/or transfers   Equipment Recommendations  Other (comment) (defer to next venue of care)    Recommendations for Other Services      Precautions / Restrictions Precautions Precautions: Fall Required Braces or  Orthoses: Sling Restrictions Weight Bearing Restrictions: Yes LUE Weight Bearing: Non weight bearing RLE Weight Bearing: Weight bearing as tolerated LLE Weight Bearing: Weight bearing as tolerated Other Position/Activity Restrictions: LUE sling for comfort       Mobility Bed Mobility Overal bed mobility: Needs Assistance Bed Mobility: Supine to Sit, Sit to Supine     Supine to sit: Min assist (assist for trunk elevation) Sit to supine: Min guard (able to bring BLEs back onto bed with increased time)        Transfers Overall transfer level: Needs assistance Equipment used: Hemi-walker Transfers: Sit to/from Stand Sit to Stand: Min assist           General transfer comment: STS from EOB, VC for hand placement/safe technique     Balance Overall balance assessment: Needs assistance, History of Falls Sitting-balance support: Feet supported Sitting balance-Leahy Scale: Fair     Standing balance support: Single extremity supported, During functional activity, Reliant on assistive device for balance Standing balance-Leahy Scale: Fair           ADL either performed or assessed with clinical judgement   ADL Overall ADL's : Needs assistance/impaired     Grooming: Set up;Sitting;Supervision/safety;Wash/dry face           Upper Body Dressing : Moderate assistance;Maximal assistance;Sitting;Cueing for UE precautions;Cueing for sequencing Upper Body Dressing Details (indicate cue type and reason): Mod A to don/doff gown, Max A to don/doff sling     Toilet Transfer: Min guard;Ambulation;Minimal assistance Toilet Transfer Details (indicate cue type and reason): simulated, use of hemi walker         Functional mobility during ADLs: Min guard (to take ~5 lateral steps at EOB using hemi walker)  Extremity/Trunk Assessment              Vision Patient Visual Report: No change from baseline     Perception     Praxis      Cognition Arousal/Alertness:  Awake/alert Behavior During Therapy: WFL for tasks assessed/performed Overall Cognitive Status: History of cognitive impairments - at baseline         General Comments: delayed processing, cues for simple commands, poor STM (asked therapist's name several times t/o session)        Exercises      Shoulder Instructions       General Comments      Pertinent Vitals/ Pain       Pain Assessment Pain Assessment: 0-10 Pain Score: 8  Pain Location: R hip and LUE Pain Descriptors / Indicators: Aching, Grimacing, Guarding, Discomfort Pain Intervention(s): Limited activity within patient's tolerance, Monitored during session, Repositioned, Patient requesting pain meds-RN notified  Home Living      Prior Functioning/Environment              Frequency  Min 1X/week        Progress Toward Goals  OT Goals(current goals can now be found in the care plan section)  Progress towards OT goals: Progressing toward goals  Acute Rehab OT Goals Patient Stated Goal: get better and have less pain OT Goal Formulation: With patient/family Time For Goal Achievement: 10/15/22 Potential to Achieve Goals: Good  Plan Discharge plan remains appropriate;Frequency remains appropriate    Co-evaluation                 AM-PAC OT "6 Clicks" Daily Activity     Outcome Measure   Help from another person eating meals?: A Little Help from another person taking care of personal grooming?: A Little Help from another person toileting, which includes using toliet, bedpan, or urinal?: A Lot Help from another person bathing (including washing, rinsing, drying)?: A Lot Help from another person to put on and taking off regular upper body clothing?: A Lot Help from another person to put on and taking off regular lower body clothing?: A Lot 6 Click Score: 14    End of Session Equipment Utilized During Treatment: Other (comment) (LUE sling, hemi walker)  OT Visit Diagnosis: Other abnormalities  of gait and mobility (R26.89);Muscle weakness (generalized) (M62.81);Pain Pain - Right/Left: Left Pain - part of body: Arm;Shoulder   Activity Tolerance Patient tolerated treatment well;Patient limited by pain   Patient Left in bed;with call bell/phone within reach;with bed alarm set   Nurse Communication Mobility status;Patient requests pain meds        Time: 0902-0921 OT Time Calculation (min): 19 min  Charges: OT General Charges $OT Visit: 1 Visit OT Treatments $Self Care/Home Management : 8-22 mins  [facility] MS, OTR/L ascom [phone]  10/05/22, 11:02 AM

## 2022-10-06 DIAGNOSIS — E785 Hyperlipidemia, unspecified: Secondary | ICD-10-CM | POA: Diagnosis not present

## 2022-10-06 DIAGNOSIS — R4189 Other symptoms and signs involving cognitive functions and awareness: Secondary | ICD-10-CM | POA: Diagnosis not present

## 2022-10-06 DIAGNOSIS — S7001XS Contusion of right hip, sequela: Secondary | ICD-10-CM

## 2022-10-06 DIAGNOSIS — E1169 Type 2 diabetes mellitus with other specified complication: Secondary | ICD-10-CM | POA: Diagnosis not present

## 2022-10-06 LAB — BASIC METABOLIC PANEL
Anion gap: 10 (ref 5–15)
BUN: 32 mg/dL — ABNORMAL HIGH (ref 6–20)
CO2: 23 mmol/L (ref 22–32)
Calcium: 8.6 mg/dL — ABNORMAL LOW (ref 8.9–10.3)
Chloride: 105 mmol/L (ref 98–111)
Creatinine, Ser: 1.36 mg/dL — ABNORMAL HIGH (ref 0.44–1.00)
GFR, Estimated: 46 mL/min — ABNORMAL LOW (ref >60)
Glucose, Bld: 125 mg/dL — ABNORMAL HIGH (ref 70–99)
Potassium: 4 mmol/L (ref 3.5–5.1)
Sodium: 138 mmol/L (ref 135–145)

## 2022-10-06 LAB — CBC
HCT: 31.1 % — ABNORMAL LOW (ref 36.0–46.0)
Hemoglobin: 10.3 g/dL — ABNORMAL LOW (ref 12.0–15.0)
MCH: 30.2 pg (ref 26.0–34.0)
MCHC: 33.1 g/dL (ref 30.0–36.0)
MCV: 91.2 fL (ref 80.0–100.0)
Platelets: 286 10*3/uL (ref 150–400)
RBC: 3.41 MIL/uL — ABNORMAL LOW (ref 3.87–5.11)
RDW: 12.5 % (ref 11.5–15.5)
WBC: 6.5 10*3/uL (ref 4.0–10.5)
nRBC: 0 % (ref 0.0–0.2)

## 2022-10-06 LAB — VITAMIN B1: Vitamin B1 (Thiamine): 134.1 nmol/L (ref 66.5–200.0)

## 2022-10-06 LAB — MAGNESIUM: Magnesium: 2.3 mg/dL (ref 1.7–2.4)

## 2022-10-06 NOTE — Plan of Care (Signed)
  Problem: Education: Goal: Knowledge of condition and prescribed therapy will improve Outcome: Progressing   Problem: Cardiac: Goal: Will achieve and/or maintain adequate cardiac output Outcome: Progressing   Problem: Physical Regulation: Goal: Complications related to the disease process, condition or treatment will be avoided or minimized Outcome: Progressing   Problem: Education: Goal: Ability to describe self-care measures that may prevent or decrease complications (Diabetes Survival Skills Education) will improve Outcome: Progressing Goal: Individualized Educational Video(s) Outcome: Progressing   Problem: Coping: Goal: Ability to adjust to condition or change in health will improve Outcome: Progressing   Problem: Fluid Volume: Goal: Ability to maintain a balanced intake and output will improve Outcome: Progressing   Problem: Health Behavior/Discharge Planning: Goal: Ability to identify and utilize available resources and services will improve Outcome: Progressing Goal: Ability to manage health-related needs will improve Outcome: Progressing   Problem: Metabolic: Goal: Ability to maintain appropriate glucose levels will improve Outcome: Progressing   Problem: Nutritional: Goal: Maintenance of adequate nutrition will improve Outcome: Progressing Goal: Progress toward achieving an optimal weight will improve Outcome: Progressing

## 2022-10-06 NOTE — Progress Notes (Signed)
PROGRESS NOTE    JUHI LAGRANGE  WUJ:[id number] DOB: [birth year] DOA: 09/30/2022 PCP: Hillery Aldo, MD  [id number]  LOS: 5 days   Brief hospital course:   Assessment & Plan: Deanna Joseph is a 57 y.o. female with medical history significant of cognitive decline, type 2 diabetes, urinary incontinence, ? Warnicke encephalopathy presenting with left humerus fracture, syncope, UTI.    patient had a witnessed fall where patient landed on her left shoulder.  Patient was unconscious transiently.  Patient was then awakened by her husband.  Positive mild confusion.  Patient does not remember what happened.  Patient does report having some generalized weakness as well as confusion prior to symptoms.     * Syncope --MRI brain neg for acute finding.  Echo unremarkable.  Likely due to the fall.  Humerus fracture, left Noted Surgical neck humerus fracture with mild impaction --ortho consulted w/ Dr. Audelia Acton, after discussion, husband elected for nonoperative approach. --sling for comfort and no weightbearing to the left upper extremity.  --oxycodone PRN --follow-up in the [facility] clinic orthopedic division in 1 to 2 weeks for repeat x-rays of her left shoulder.   Right hip contusion  --weightbearing as tolerated bilateral lower extremities.   UTI (urinary tract infection) 2/2 Klebsiella  UA indicative of infection in setting of worsening confusion and syncope  --completed 3 days of ceftriaxone  Cognitive decline Progressive memory, functional decline, weakness per the husband over multiple years Noted prior diagnosis of ? Wernicke encephalopathy as well as temporal lobe atrophy on CT head  --delirium precautions.    Type 2 diabetes mellitus with hyperlipidemia (HCC) --A1c 7.9.  No hypoglycemics on home met list. --d/c'ed BG checks and SSI since BG have been within inpatient goals.  AKI, ruled out CKD 3a --Cr 1.29 on presentation, did not trend down with IVF.  Last Cr 1.16 back in  Oct 2022.  Pt likely has CKD 3a.   DVT prophylaxis: Lovenox SQ Code Status: Full code  Family Communication:  Level of care: Med-Surg Dispo:   The patient is from: home Anticipated d/c is to: SNF Anticipated d/c date is: whenever bed available   Subjective and Interval History:  Pt awake resting in bed when seen this AM.  No family present at the time.  Pt cannot recall if she's had any symptoms or complaints. Denies pain at this time.    Objective: Vitals:   10/06/22 0111 10/06/22 0802 10/06/22 1201 10/06/22 1559  BP: (!) 167/81 (!) 141/66 122/60 (!) 144/73  Pulse: (!) 102 96 93 96  Resp: 20 16 16 16   Temp: 98.9 F (37.2 C) 98.2 F (36.8 C) 98.5 F (36.9 C) 99.3 F (37.4 C)  TempSrc:      SpO2: 99% 98% 100% 100%  Weight:      Height:        Intake/Output Summary (Last 24 hours) at 10/06/2022 1634 Last data filed at 10/06/2022 9562 Gross per 24 hour  Intake 60 ml  Output 800 ml  Net -740 ml   Filed Weights   10/01/22 0500 10/02/22 0500 10/04/22 0500  Weight: 78.6 kg 80.1 kg 76.4 kg    Examination:   General exam: awake, alert, no acute distress, pleasantly confused HEENT: moist mucus membranes, hearing grossly normal  Respiratory system: CTAB no wheezes, rales or rhonchi, normal respiratory effort. Cardiovascular system: normal S1/S2, RRR, no pedal edema.   Gastrointestinal system: soft, NT, ND, no HSM felt, +bowel sounds. Central nervous system: A&O x self and hospital.  no gross focal neurologic deficits, normal speech Extremities: Left arm in sling, no edema, normal tone Skin: dry, intact, normal temperature Psychiatry: normal mood, congruent affect, abnormal judgement and insight (baseline)    Data Reviewed: I have personally reviewed labs and imaging studies  Time spent: 25 minutes  Pennie Banter, DO [facility] If 7PM-7AM, please contact night-coverage 10/06/2022, 4:34 PM

## 2022-10-06 NOTE — TOC Progression Note (Signed)
Transition of Care [facility]) - Progression Note    Patient Details  Name: Deanna Joseph MRN: [id number] Date of Birth: Feb 10, 1966  Transition of Care [facility]) CM/SW Contact  Marlowe Sax, RN Phone Number: 10/06/2022, 11:39 AM  Clinical Narrative:    Still no bed offers, the patient is moving well and had no physical assistance from PT, she is at baseline and will no be appropriate for STR, her husband is her caregiver   Expected Discharge Plan: Skilled Nursing Facility Barriers to Discharge: SNF Pending bed offer  Expected Discharge Plan and Services   Discharge Planning Services: CM Consult   Living arrangements for the past 2 months: Single Family Home                                       Social Determinants of Health (SDOH) Interventions SDOH Screenings   Food Insecurity: No Food Insecurity (09/30/2022)  Housing: Low Risk  (09/30/2022)  Transportation Needs: No Transportation Needs (09/30/2022)  Utilities: Not At Risk (09/30/2022)  Tobacco Use: Low Risk  (09/30/2022)    Readmission Risk Interventions     No data to display

## 2022-10-06 NOTE — Progress Notes (Signed)
Mobility Specialist - Progress Note   10/06/22 1501  Mobility  Activity Ambulated with assistance in room  Level of Assistance Standby assist, set-up cues, supervision of patient - no hands on  Assistive Device  (HW)  Distance Ambulated (ft) 20 ft  LUE Weight Bearing NWB  RLE Weight Bearing WBAT  LLE Weight Bearing WBAT  Activity Response Tolerated well  $Mobility charge 1 Mobility   Pt supine upon entry, utilizing RA. Pt agreeable to OOB amb-- often turns to her husband for answers when asked a question. Pt expresses feeling some pain in the L shoulder, however willing to participate in activity. Pt completed bed mob HHA to bring trunk upright and follow NWB of LUE. Pt STS to HW and amb MinG-SBA-- slow and steady. Pt amb to the door in room and back to bed, taking both lateral and backward steps. Pt showed good safety awareness and she returned back to bed, insuring she remains NWB. Pt lefty supine with alarm set and needs within reach

## 2022-10-06 NOTE — Progress Notes (Signed)
Physical Therapy Treatment Patient Details Name: Deanna Joseph MRN: [id number] DOB: 03-30-1966 Today's Date: 10/06/2022   History of Present Illness 57 y.o. female with past medical history of Bell's palsy, DM II, GERD, and Warnicke's encephalopathy who presents because of multiple falls, found to have a left proximal humerus fracture after a fall.  MD assessment includes: comminuted impacted left proximal humerus fracture to be managed conservatively, R hip pain but no fractures or dislocations noted in the hips or pelvis, syncope, and UTI.    PT Comments    Pt continues to show increased independence and confidence with mobility and did not need any direct assist to/from supine/sit/stand.  She needed consistent cuing and encouragement as she quickly forgets what we were doing/why but ultimately was safe and appropriately efficient with in home mobility and ambulation distance.  She walked ~65 ft with VSS and no overt safety issues.  No LOBs with HW, may trial SPC next session per continued progress.  Will maintain on PT caseload and continue to see as appropriate.     Recommendations for follow up therapy are one component of a multi-disciplinary discharge planning process, led by the attending physician.  Recommendations may be updated based on patient status, additional functional criteria and insurance authorization.  Follow Up Recommendations  Can patient physically be transported by private vehicle: Yes    Assistance Recommended at Discharge Frequent or constant Supervision/Assistance  Patient can return home with the following Direct supervision/assist for medications management;Direct supervision/assist for financial management;Assist for transportation;Help with stairs or ramp for entrance;A little help with walking and/or transfers;A little help with bathing/dressing/bathroom   Equipment Recommendations  Other (comment) (hemi walker)    Recommendations for Other Services        Precautions / Restrictions Precautions Precautions: Fall Restrictions LUE Weight Bearing: Non weight bearing RLE Weight Bearing: Weight bearing as tolerated LLE Weight Bearing: Weight bearing as tolerated     Mobility  Bed Mobility Overal bed mobility: Modified Independent Bed Mobility: Supine to Sit, Sit to Supine     Supine to sit: Supervision Sit to supine: Supervision   General bed mobility comments: Pt was able to transition supine to sit and later sit to supine w/o direct assist.  Minimal cuing to initiate movement and remind her not use use L (in sling) UE.    Transfers Overall transfer level: Needs assistance Equipment used: Hemi-walker Transfers: Sit to/from Stand Sit to Stand: Supervision           General transfer comment: STS from EOB, VC for hand placement/safe technique - did not need direct assist    Ambulation/Gait Ambulation/Gait assistance: Min guard Gait Distance (Feet): 65 Feet Assistive device: Hemi-walker         General Gait Details: Raised HW with good results - cues for [facility] placement/use.  Needing repeated cues/reinforcement to stay on task and motivated but able to increase distance and did not show any unsteadiness or overt safety issues.  Could trial SPC next session per continued progress.   Stairs             Wheelchair Mobility    Modified Rankin (Stroke Patients Only)       Balance Overall balance assessment: Needs assistance, History of Falls Sitting-balance support: Feet supported Sitting balance-Leahy Scale: Good     Standing balance support: Single extremity supported, During functional activity, Reliant on assistive device for balance Standing balance-Leahy Scale: Good Standing balance comment: pt is cautious needing plenty of cuing/reinforcement but able to  maintain balance relatively well with appropriate UE/AD reliance                            Cognition Arousal/Alertness: Awake/alert Behavior  During Therapy: WFL for tasks assessed/performed Overall Cognitive Status: History of cognitive impairments - at baseline                                          Exercises General Exercises - Lower Extremity Ankle Circles/Pumps: AROM, 10 reps Heel Slides: Strengthening, 10 reps (with resisted leg ext) Hip ABduction/ADduction: Strengthening, 10 reps    General Comments General comments (skin integrity, edema, etc.): Pt was able to do mobility/transfers/ambulation with just cuing/reinforcement/encouagement but did not need physical assist with any of these. Frequent cuing to not use L UE (adjusted sling) but otherwise apart from expected awareness issues she did well.      Pertinent Vitals/Pain Pain Assessment Pain Assessment: 0-10 Pain Score: 3  Pain Location: R hip and L shoulder    Home Living                          Prior Function            PT Goals (current goals can now be found in the care plan section) Progress towards PT goals: Progressing toward goals    Frequency    7X/week      PT Plan Discharge plan needs to be updated    Co-evaluation              AM-PAC PT "6 Clicks" Mobility   Outcome Measure  Help needed turning from your back to your side while in a flat bed without using bedrails?: None Help needed moving from lying on your back to sitting on the side of a flat bed without using bedrails?: None Help needed moving to and from a bed to a chair (including a wheelchair)?: A Little Help needed standing up from a chair using your arms (e.g., wheelchair or bedside chair)?: A Little Help needed to walk in hospital room?: A Little Help needed climbing 3-5 steps with a railing? : A Little 6 Click Score: 20    End of Session Equipment Utilized During Treatment: Gait belt Activity Tolerance: Patient tolerated treatment well Patient left: with call bell/phone within reach;with bed alarm set Nurse Communication: Mobility  status;Weight bearing status PT Visit Diagnosis: History of falling (Z91.81);Unsteadiness on feet (R26.81);Difficulty in walking, not elsewhere classified (R26.2);Muscle weakness (generalized) (M62.81);Pain Pain - Right/Left: Left Pain - part of body: Shoulder     Time: 1610-9604 PT Time Calculation (min) (ACUTE ONLY): 25 min  Charges:  $Gait Training: 8-22 mins $Therapeutic Exercise: 8-22 mins                     [name], DPT 10/06/2022, 1:04 PM

## 2022-10-06 NOTE — Plan of Care (Signed)

## 2022-10-07 DIAGNOSIS — S7001XS Contusion of right hip, sequela: Secondary | ICD-10-CM | POA: Diagnosis not present

## 2022-10-07 DIAGNOSIS — E1169 Type 2 diabetes mellitus with other specified complication: Secondary | ICD-10-CM | POA: Diagnosis not present

## 2022-10-07 DIAGNOSIS — E785 Hyperlipidemia, unspecified: Secondary | ICD-10-CM | POA: Diagnosis not present

## 2022-10-07 MED ORDER — OXYCODONE HCL 5 MG PO TABS: 5.0000 mg | ORAL_TABLET | Freq: Four times a day (QID) | ORAL | 0 refills | Status: DC | PRN

## 2022-10-07 NOTE — Progress Notes (Signed)
Physical Therapy Treatment Patient Details Name: Deanna Joseph MRN: [id number] DOB: May 25, 1966 Today's Date: 10/07/2022   History of Present Illness 57 y.o. female with past medical history of Bell's palsy, DM II, GERD, and Warnicke's encephalopathy who presents because of multiple falls, found to have a left proximal humerus fracture after a fall.  MD assessment includes: comminuted impacted left proximal humerus fracture to be managed conservatively, R hip pain but no fractures or dislocations noted in the hips or pelvis, syncope, and UTI.    PT Comments    Pt continues to be pleasantly confused but showing consistent improvement with mobility, safety, activity tolerance, etc.  She was able to easily rise to standing w/o physical assist with minimal cuing for appropriate use of SPC and postioning for transitions to/from standing.   She was better able to ambulate with SPC today then [facility] and showed intuitively safe usage of it while circumambulating the nurses' station (~200 ft) with minimal gait training cuing needed but plenty of directional cuing and reinforcement.  Spoke with husband about sling management/use and answered questions about concerns managing in the home.  She continued to make good gains with gait/mobility, continue with PT per POC.   Recommendations for follow up therapy are one component of a multi-disciplinary discharge planning process, led by the attending physician.  Recommendations may be updated based on patient status, additional functional criteria and insurance authorization.  Follow Up Recommendations  Can patient physically be transported by private vehicle: Yes    Assistance Recommended at Discharge Frequent or constant Supervision/Assistance  Patient can return home with the following Direct supervision/assist for medications management;Direct supervision/assist for financial management;Assist for transportation;Help with stairs or ramp for entrance;A little help  with walking and/or transfers;A little help with bathing/dressing/bathroom   Equipment Recommendations   [facility])    Recommendations for Other Services       Precautions / Restrictions Precautions Precautions: Fall Restrictions Weight Bearing Restrictions: Yes LUE Weight Bearing: Non weight bearing RLE Weight Bearing: Weight bearing as tolerated LLE Weight Bearing: Weight bearing as tolerated     Mobility  Bed Mobility               General bed mobility comments: In recliner pre/post session    Transfers Overall transfer level: Needs assistance Equipment used: Straight cane Transfers: Sit to/from Stand Sit to Stand: Modified independent (Device/Increase time)           General transfer comment: only needed cuing for management/use/postioning of SPC during transitions sit<>stand    Ambulation/Gait Ambulation/Gait assistance: Supervision Gait Distance (Feet): 200 Feet Assistive device: Straight cane         General Gait Details: Pt able to circumambulate the nurses' station with relative ease, minimal cuing t/o for [facility] use but pt intuitively able to use it relatively well (and better than she did with the [facility] yesterday).  Pt reports some fatigue and HR did approach 130s (O2 high 90s t/o on RA) but did not have any overt LOBs or safety concerns apart from need for directional cuing per baseline cognition issues   Stairs             Wheelchair Mobility    Modified Rankin (Stroke Patients Only)       Balance Overall balance assessment: Needs assistance, History of Falls   Sitting balance-Leahy Scale: Good     Standing balance support: Single extremity supported, During functional activity, Reliant on assistive device for balance Standing balance-Leahy Scale: Good Standing balance comment:  pt is cautious needing plenty of cuing/reinforcement but able to maintain balance relatively well with appropriate UE/AD reliance                             Cognition Arousal/Alertness: Awake/alert Behavior During Therapy: WFL for tasks assessed/performed Overall Cognitive Status: History of cognitive impairments - at baseline                                 General Comments: delayed processing, cues for simple commands, poor STM        Exercises      General Comments General comments (skin integrity, edema, etc.): Pt continues to show increased confidence and competency with mobility/ambulation      Pertinent Vitals/Pain Pain Assessment Pain Assessment: Faces Faces Pain Scale: Hurts a little bit Pain Location: reports R hip barely hurts during ambulation, mild guarding with PT adjusting L should sling    Home Living                          Prior Function            PT Goals (current goals can now be found in the care plan section) Progress towards PT goals: Progressing toward goals    Frequency    Min 4X/week      PT Plan Frequency needs to be updated    Co-evaluation              AM-PAC PT "6 Clicks" Mobility   Outcome Measure  Help needed turning from your back to your side while in a flat bed without using bedrails?: None Help needed moving from lying on your back to sitting on the side of a flat bed without using bedrails?: None Help needed moving to and from a bed to a chair (including a wheelchair)?: A Little Help needed standing up from a chair using your arms (e.g., wheelchair or bedside chair)?: A Little Help needed to walk in hospital room?: A Little Help needed climbing 3-5 steps with a railing? : A Little 6 Click Score: 20    End of Session Equipment Utilized During Treatment: Gait belt Activity Tolerance: Patient tolerated treatment well Patient left: with call bell/phone within reach;with chair alarm set;with family/visitor present Nurse Communication: Mobility status PT Visit Diagnosis: History of falling (Z91.81);Unsteadiness on feet (R26.81);Difficulty  in walking, not elsewhere classified (R26.2);Muscle weakness (generalized) (M62.81);Pain Pain - Right/Left: Left Pain - part of body: Shoulder     Time: 8295-6213 PT Time Calculation (min) (ACUTE ONLY): 18 min  Charges:  $Gait Training: 8-22 mins                     [name], DPT 10/07/2022, 10:43 AM

## 2022-10-07 NOTE — TOC Progression Note (Addendum)
Transition of Care [facility]) - Progression Note    Patient Details  Name: Deanna Joseph MRN: [id number] Date of Birth: 12-31-1965  [facility] CM/SW Contact  Marlowe Sax, RN Phone Number: 10/07/2022, 9:16 AM  Clinical Narrative:    I reached out to multiple [facility] agencies to determine who might be INN with her Insurance, [facility] is not INN Waiting to hear back from others [facility] reports that they are INN but due to it being their charity week they are unable to accept the patient [facility] can not accept, [facility] can not accept, [facility] can not accept, [facility] can not accept  [facility] has accepted for [facility] PT and OT  The patient is ambulating in the room without physical assistance,  I called the husband Anselm Lis and encouraged him to call medicaid to set up PCS services He tells me that she needs more care than he can provide I explained that medically she is at baseline and will be discharged home.  I explained there are companies that also have PCS services and he stated that he is not paying for that He stated that he is not responsible for his wife, I explained that he is her next of kin and    Expected Discharge Plan: Skilled Nursing Facility Barriers to Discharge: SNF Pending bed offer  Expected Discharge Plan and Services   Discharge Planning Services: CM Consult   Living arrangements for the past 2 months: Single Family Home                                       Social Determinants of Health (SDOH) Interventions SDOH Screenings   Food Insecurity: No Food Insecurity (09/30/2022)  Housing: Low Risk  (09/30/2022)  Transportation Needs: No Transportation Needs (09/30/2022)  Utilities: Not At Risk (09/30/2022)  Tobacco Use: Low Risk  (09/30/2022)    Readmission Risk Interventions     No data to display

## 2022-10-07 NOTE — Discharge Summary (Signed)
Physician Discharge Summary   [name] MRN: [id number] DOB: 1965/06/27  Admit date:     09/30/2022  Discharge date: 10/08/22  Discharge Physician: Pennie Banter   PCP: Hillery Aldo, MD   Recommendations at discharge:   Follow up at [facility] in 1-2 weeks Follow up with Primary Care in 1-2 weeks Repeat CBC, BMP in 1-2 weeks  Discharge Diagnoses: Principal Problem:   Syncope Active Problems:   AKI (acute kidney injury) (HCC)   Type 2 diabetes mellitus with hyperlipidemia (HCC)   UTI (urinary tract infection)   Humerus fracture   Cognitive decline   Contusion of right hip  Resolved Problems:   * No resolved hospital problems. *  Hospital Course:  Deanna Joseph is a 57 y.o. female with medical history significant of cognitive decline, type 2 diabetes, urinary incontinence, ? Warnicke encephalopathy presenting with left humerus fracture, syncope, UTI.     patient had a witnessed fall where patient landed on her left shoulder.  Patient was unconscious transiently.  Patient was then awakened by her husband.  Positive mild confusion.  Patient does not remember what happened.  Patient does report having some generalized weakness as well as confusion prior to symptoms.    Further hospital course and management as outlined below.  10/07/22 -- pt is medically stable for d/c home.  Functionally improved with PT/OT, no longer needs SNF for rehab.  She is at her functional baseline with ambulation.     Assessment and Plan:   Syncope --MRI brain neg for acute finding.   --Echo unremarkable.   --Suspect possible orthostatic as her diastolic BP"s are quite soft.   Humerus fracture, left Noted Surgical neck humerus fracture with mild impaction --ortho consulted w/ Dr. Audelia Acton, after discussion, husband elected for nonoperative approach. --sling for comfort and no weightbearing to the left upper extremity.  --oxycodone PRN --follow-up in the [facility] orthopedic division in 1 to 2 weeks for repeat x-rays of her left shoulder.    Right hip contusion  --weightbearing as tolerated bilateral lower extremities.    UTI (urinary tract infection) 2/2 Klebsiella  UA indicative of infection in setting of worsening confusion and syncope  --completed 3 days of ceftriaxone   Cognitive decline Progressive memory, functional decline, weakness per the husband over multiple years Noted prior diagnosis of ? Wernicke encephalopathy as well as temporal lobe atrophy on CT head  --delirium precautions.     Type 2 diabetes mellitus with hyperlipidemia (HCC) --A1c 7.9.  No hypoglycemics on home met list. --d/c'ed BG checks and SSI since BG have been within inpatient goals.   AKI, ruled out CKD 3a --Cr 1.29 on presentation, did not trend down with IVF.  Last Cr 1.16 back in Oct 2022.  Pt likely has CKD 3a.         Consultants: Orthopedic surgery Procedures performed: None  Disposition: Home health Diet recommendation:  Cardiac diet DISCHARGE MEDICATION: Allergies as of 10/07/2022       Reactions   Penicillins    Rash        Medication List     STOP taking these medications    aspirin EC 81 MG tablet   atorvastatin 20 MG tablet Commonly known as: LIPITOR   carbamazepine 200 MG tablet Commonly known as: TEGRETOL   cetirizine 10 MG tablet Commonly known as: ZYRTEC   DULoxetine 60 MG capsule Commonly known as: CYMBALTA   M-Natal Plus 27-1 MG Tabs   PARoxetine 40  MG tablet Commonly known as: PAXIL   rosuvastatin 5 MG tablet Commonly known as: CRESTOR   topiramate 25 MG tablet Commonly known as: TOPAMAX       TAKE these medications    acetaminophen 500 MG tablet Commonly known as: TYLENOL Take 500 mg by mouth every 6 (six) hours as needed.   feeding supplement Liqd Take 237 mLs by mouth 3 (three) times daily between meals.   folic acid 1 MG tablet Commonly known as: FOLVITE Take 1 mg by mouth daily.    gabapentin 300 MG capsule Commonly known as: NEURONTIN Take 300 mg by mouth at bedtime.   omeprazole 20 MG capsule Commonly known as: PRILOSEC Take 20 mg by mouth daily.   oxyCODONE 5 MG immediate release tablet Commonly known as: Oxy IR/ROXICODONE Take 1 tablet (5 mg total) by mouth every 6 (six) hours as needed for moderate pain or severe pain.   polyethylene glycol 17 g packet Commonly known as: MIRALAX / GLYCOLAX Take 17 g by mouth 2 (two) times daily. Can decrease to once a day if having >2 BM's per day.   traZODone 50 MG tablet Commonly known as: DESYREL Take 50 mg by mouth at bedtime.        Follow-up Information     [facility] Follow up in 2 day(s).   Specialty: Home Health Services Contact information: [address] [place] [address] [place] [address] [phone]                Discharge Exam: Filed Weights   10/02/22 0500 10/04/22 0500 10/07/22 0500  Weight: 80.1 kg 76.4 kg 78 kg   General exam: awake, alert, no acute distress HEENT: moist mucus membranes, hearing grossly normal  Respiratory system: CTAB, no wheezes, rales or rhonchi, normal respiratory effort. Cardiovascular system: normal S1/S2, RRR, no pedal edema.   Gastrointestinal system: soft, NT, ND, no HSM felt, +bowel sounds. Central nervous system: A&O x self hospital. no gross focal neurologic deficits, normal speech Extremities: left arm in sling, no edema, normal tone Skin: dry, intact, normal temperature  Psychiatry: normal mood, congruent affect, abnormal judgment and insight   Condition at discharge: stable  The results of significant diagnostics from this hospitalization (including imaging, microbiology, ancillary and laboratory) are listed below for reference.   Imaging Studies: ECHOCARDIOGRAM COMPLETE  Result Date: 10/01/2022    ECHOCARDIOGRAM REPORT   Patient Name:   Deanna Joseph Date of Exam: 10/01/2022 Medical Rec #:  [id number]       Height:       61.0 in  Accession #:    [id number]      Weight:       173.3 lb Date of Birth:  1966-04-29      BSA:          1.777 m Patient Age:    56 years        BP:           154/71 mmHg Patient Gender: F               HR:           98 bpm. Exam Location:  [facility] Procedure: 2D Echo, Cardiac Doppler and Color Doppler Indications:     Syncope R55  History:         Patient has no prior history of Echocardiogram examinations.  Sonographer:     Cristela Blue Referring Phys:  [id number] Francoise Schaumann [place] Diagnosing Phys: Julien Nordmann MD  Sonographer Comments: Technically challenging study due to limited acoustic windows, no subcostal window and suboptimal apical window. Pt has left arm in sling. IMPRESSIONS  1. Left ventricular ejection fraction, by estimation, is 60 to 65%. The left ventricle has normal function. The left ventricle has no regional wall motion abnormalities. Left ventricular diastolic parameters are consistent with Grade I diastolic dysfunction (impaired relaxation).  2. Right ventricular systolic function is normal. The right ventricular size is normal. There is normal pulmonary artery systolic pressure. The estimated right ventricular systolic pressure is 23.5 mmHg.  3. The mitral valve is normal in structure. No evidence of mitral valve regurgitation. No evidence of mitral stenosis.  4. The aortic valve was not well visualized. Aortic valve regurgitation is not visualized. No aortic stenosis is present. Aortic valve area, by VTI measures 2.81 cm.  5. The inferior vena cava is normal in size with greater than 50% respiratory variability, suggesting right atrial pressure of 3 mmHg. FINDINGS  Left Ventricle: Left ventricular ejection fraction, by estimation, is 60 to 65%. The left ventricle has normal function. The left ventricle has no regional wall motion abnormalities. The left ventricular internal cavity size was normal in size. There is  no left ventricular hypertrophy. Left ventricular diastolic parameters are consistent with  Grade I diastolic dysfunction (impaired relaxation). Right Ventricle: The right ventricular size is normal. No increase in right ventricular wall thickness. Right ventricular systolic function is normal. There is normal pulmonary artery systolic pressure. The tricuspid regurgitant velocity is 2.15 m/s, and  with an assumed right atrial pressure of 5 mmHg, the estimated right ventricular systolic pressure is 23.5 mmHg. Left Atrium: Left atrial size was normal in size. Right Atrium: Right atrial size was normal in size. Pericardium: There is no evidence of pericardial effusion. Mitral Valve: The mitral valve is normal in structure. No evidence of mitral valve regurgitation. No evidence of mitral valve stenosis. MV peak gradient, 5.8 mmHg. The mean mitral valve gradient is 3.0 mmHg. Tricuspid Valve: The tricuspid valve is normal in structure. Tricuspid valve regurgitation is not demonstrated. No evidence of tricuspid stenosis. Aortic Valve: The aortic valve was not well visualized. Aortic valve regurgitation is not visualized. No aortic stenosis is present. Aortic valve mean gradient measures 3.5 mmHg. Aortic valve peak gradient measures 6.2 mmHg. Aortic valve area, by VTI measures 2.81 cm. Pulmonic Valve: The pulmonic valve was normal in structure. Pulmonic valve regurgitation is not visualized. No evidence of pulmonic stenosis. Aorta: The aortic root is normal in size and structure. Venous: The inferior vena cava is normal in size with greater than 50% respiratory variability, suggesting right atrial pressure of 3 mmHg. IAS/Shunts: No atrial level shunt detected by color flow Doppler.  LEFT VENTRICLE PLAX 2D LVIDd:         3.50 cm   Diastology LVIDs:         2.10 cm   LV e' medial:    7.29 cm/s LV PW:         0.80 cm   LV E/e' medial:  16.0 LV IVS:        0.70 cm   LV e' lateral:   10.40 cm/s LVOT diam:     2.00 cm   LV E/e' lateral: 11.2 LV SV:         62 LV SV Index:   35 LVOT Area:     3.14 cm  RIGHT VENTRICLE  RV Basal diam:  3.70 cm RV Mid diam:    3.50 cm  LEFT ATRIUM             Index        RIGHT ATRIUM          Index LA diam:        2.20 cm 1.24 cm/m   RA Area:     9.76 cm LA Vol (A2C):   21.8 ml 12.27 ml/m  RA Volume:   20.60 ml 11.59 ml/m LA Vol (A4C):   14.2 ml 7.99 ml/m LA Biplane Vol: 19.2 ml 10.80 ml/m  AORTIC VALVE AV Area (Vmax):    2.30 cm AV Area (Vmean):   2.34 cm AV Area (VTI):     2.81 cm AV Vmax:           124.50 cm/s AV Vmean:          91.250 cm/s AV VTI:            0.220 m AV Peak Grad:      6.2 mmHg AV Mean Grad:      3.5 mmHg LVOT Vmax:         91.00 cm/s LVOT Vmean:        68.100 cm/s LVOT VTI:          0.196 m LVOT/AV VTI ratio: 0.89  AORTA Ao Root diam: 3.00 cm MITRAL VALVE                TRICUSPID VALVE MV Area (PHT): 4.57 cm     TR Peak grad:   18.5 mmHg MV Area VTI:   2.25 cm     TR Vmax:        215.00 cm/s MV Peak grad:  5.8 mmHg MV Mean grad:  3.0 mmHg     SHUNTS MV Vmax:       1.20 m/s     Systemic VTI:  0.20 m MV Vmean:      86.7 cm/s    Systemic Diam: 2.00 cm MV Decel Time: 166 msec MV E velocity: 117.00 cm/s MV A velocity: 112.00 cm/s MV E/A ratio:  1.04 Julien Nordmann MD Electronically signed by Julien Nordmann MD Signature Date/Time: 10/01/2022/11:46:51 AM    Final    MR BRAIN WO CONTRAST  Result Date: 09/30/2022 CLINICAL DATA:  Head trauma, focal neuro findings (Age 30-64y) EXAM: MRI HEAD WITHOUT CONTRAST TECHNIQUE: Multiplanar, multiecho pulse sequences of the brain and surrounding structures were obtained without intravenous contrast. COMPARISON:  CT head September 30, 2022. FINDINGS: Brain: No acute infarction, hemorrhage, hydrocephalus, extra-axial collection or mass lesion. Cerebral atrophy with temporal lobe predominance. Associated ex vacuo ventricular dilation of the temporal horns. Mild scattered T2/FLAIR hyperintensity white matter, nonspecific but compatible with chronic microvascular ischemic disease. Vascular: Major arterial flow voids are maintained skull base.  Skull and upper cervical spine: Normal marrow signal. Sinuses/Orbits: Clear sinuses.  No acute orbital findings. Other: No mastoid effusions. IMPRESSION: 1. No evidence of acute intracranial abnormality. 2. Cerebral atrophy with temporal lobe predominance, age-advanced. This finding is nonspecific but can be seen with Alzheimer's disease in the correct clinical setting. 3. Mild chronic microvascular ischemic disease. Electronically Signed   By: Feliberto Harts M.D.   On: 09/30/2022 12:54   CT PELVIS WO CONTRAST  Result Date: 09/30/2022 CLINICAL DATA:  Pain after fall EXAM: CT PELVIS WITHOUT CONTRAST TECHNIQUE: Multidetector CT imaging of the pelvis was performed following the standard protocol without intravenous contrast. RADIATION DOSE REDUCTION: This exam was performed according to the departmental dose-optimization program which includes automated exposure control,  adjustment of the mA and/or kV according to patient size and/or use of iterative reconstruction technique. COMPARISON:  X-ray earlier 09/30/2022 FINDINGS: Urinary Tract:  Preserved contours of the urinary bladder. Bowel: Visualized bowel in the pelvis is nondilated. There is scattered colonic stool. Normal retrocecal appendix. Vascular/Lymphatic: Mild scattered vascular calcifications. No specific abnormal lymph node enlargement identified in the pelvis. Reproductive:  No adnexal mass. Other:  Small amount of free fluid in the pelvis. Musculoskeletal: Degenerative changes seen of the visualized lumbar spine. There is some sclerosis along the superior endplate of S1. Mild joint space loss, osteophytes and sclerosis of the sacroiliac joints. Concentric mild joint space loss of the hips with osteophytes. No fracture is clearly seen today. Mild anasarca. IMPRESSION: Multifocal changes.  No fracture or dislocation seen. Trace nonspecific free fluid in the pelvis. Electronically Signed   By: Karen Kays M.D.   On: 09/30/2022 09:55   DG Hand  Complete Left  Result Date: 09/30/2022 CLINICAL DATA:  Pain after fall EXAM: LEFT HAND - COMPLETE 3 VIEW COMPARISON:  None Available. FINDINGS: Monitoring lead obscures the distal aspect of the second phalanx. Otherwise no underlying fracture or dislocation. Preserved joint spaces and bone mineralization. Minimal degenerative changes of the interphalangeal joints. IMPRESSION: No acute osseous abnormality Electronically Signed   By: Karen Kays M.D.   On: 09/30/2022 09:45   DG HIPS BILAT WITH PELVIS MIN 5 VIEWS  Result Date: 09/30/2022 CLINICAL DATA:  Fall EXAM: DG HIP (WITH OR WITHOUT PELVIS) 5V BILAT COMPARISON:  March 16, 2019 right hip radiograph. FINDINGS: No evidence of hip fracture or dislocation. Moderate degenerative changes of the bilateral hips. Soft tissues are unremarkable. IMPRESSION: 1. No acute fracture or dislocation. 2. Moderate degenerative changes of the bilateral hips. Electronically Signed   By: Allegra Lai M.D.   On: 09/30/2022 08:55   DG Chest Portable 1 View  Result Date: 09/30/2022 CLINICAL DATA:  Left hip pain after fall. EXAM: PORTABLE CHEST 1 VIEW COMPARISON:  None Available. FINDINGS: The heart size and mediastinal contours are within normal limits. Both lungs are clear. No visible pleural effusions or pneumothorax. No acute osseous abnormality. IMPRESSION: No active disease. Electronically Signed   By: Feliberto Harts M.D.   On: 09/30/2022 08:54   DG Shoulder Left  Result Date: 09/30/2022 CLINICAL DATA:  Left shoulder pain after fall. EXAM: LEFT SHOULDER - 3 VIEW COMPARISON:  None Available. FINDINGS: Surgical neck humerus fracture without measurable displacement. On the scapular Y-view there is lateral sided impaction by 7 mm. Some comminution towards the base of the greater tuberosity but no complete tuberosity fracture is noted. Located glenohumeral and acromioclavicular joints. Negative covered left chest. IMPRESSION: Surgical neck humerus fracture with mild  impaction. Electronically Signed   By: Tiburcio Pea M.D.   On: 09/30/2022 07:31   CT HEAD WO CONTRAST ( )  Result Date: 09/30/2022 CLINICAL DATA:  Moderate to severe head trauma EXAM: CT HEAD WITHOUT CONTRAST CT CERVICAL SPINE WITHOUT CONTRAST TECHNIQUE: Multidetector CT imaging of the head and cervical spine was performed following the standard protocol without intravenous contrast. Multiplanar CT image reconstructions of the cervical spine were also generated. RADIATION DOSE REDUCTION: This exam was performed according to the departmental dose-optimization program which includes automated exposure control, adjustment of the mA and/or kV according to patient size and/or use of iterative reconstruction technique. COMPARISON:  None Available. FINDINGS: CT HEAD FINDINGS Brain: No evidence of acute infarction, hemorrhage, hydrocephalus, extra-axial collection or mass lesion/mass effect. Prominent medial temporal lobe  atrophy, age premature. Vascular: No hyperdense vessel or unexpected calcification. Skull: Normal. Negative for fracture or focal lesion. Sinuses/Orbits: No acute finding. CT CERVICAL SPINE FINDINGS Alignment: Normal. Skull base and vertebrae: No acute fracture. No primary bone lesion or focal pathologic process. Soft tissues and spinal canal: No prevertebral fluid or swelling. No visible canal hematoma. Disc levels:  No significant degenerative changes Upper chest: No visible injury Other: Partial coverage of bilateral parotid atrophy. IMPRESSION: 1. No evidence of acute intracranial or cervical spine injury. 2. Premature temporal lobe atrophy. Electronically Signed   By: Tiburcio Pea M.D.   On: 09/30/2022 07:23   CT Cervical Spine Wo Contrast  Result Date: 09/30/2022 CLINICAL DATA:  Moderate to severe head trauma EXAM: CT HEAD WITHOUT CONTRAST CT CERVICAL SPINE WITHOUT CONTRAST TECHNIQUE: Multidetector CT imaging of the head and cervical spine was performed following the standard protocol  without intravenous contrast. Multiplanar CT image reconstructions of the cervical spine were also generated. RADIATION DOSE REDUCTION: This exam was performed according to the departmental dose-optimization program which includes automated exposure control, adjustment of the mA and/or kV according to patient size and/or use of iterative reconstruction technique. COMPARISON:  None Available. FINDINGS: CT HEAD FINDINGS Brain: No evidence of acute infarction, hemorrhage, hydrocephalus, extra-axial collection or mass lesion/mass effect. Prominent medial temporal lobe atrophy, age premature. Vascular: No hyperdense vessel or unexpected calcification. Skull: Normal. Negative for fracture or focal lesion. Sinuses/Orbits: No acute finding. CT CERVICAL SPINE FINDINGS Alignment: Normal. Skull base and vertebrae: No acute fracture. No primary bone lesion or focal pathologic process. Soft tissues and spinal canal: No prevertebral fluid or swelling. No visible canal hematoma. Disc levels:  No significant degenerative changes Upper chest: No visible injury Other: Partial coverage of bilateral parotid atrophy. IMPRESSION: 1. No evidence of acute intracranial or cervical spine injury. 2. Premature temporal lobe atrophy. Electronically Signed   By: Tiburcio Pea M.D.   On: 09/30/2022 07:23    Microbiology: Results for orders placed or performed during the hospital encounter of 09/30/22  Urine Culture     Status: Abnormal   Collection Time: 09/30/22  6:46 AM   Specimen: Urine, Clean Catch  Result Value Ref Range Status   Specimen Description   Final    URINE, CLEAN CATCH Performed at [facility], [address] [place], [place] [address]    Special Requests   Final    NONE Performed at [facility], [address] [place], [place] [address]    Culture >=100,000 COLONIES/mL KLEBSIELLA PNEUMONIAE (A)  Final   Report Status 10/02/2022 FINAL  Final   Organism ID, Bacteria KLEBSIELLA PNEUMONIAE  (A)  Final      Susceptibility   Klebsiella pneumoniae - MIC*    AMPICILLIN >=32 RESISTANT Resistant     CEFAZOLIN <=4 SENSITIVE Sensitive     CEFEPIME <=0.12 SENSITIVE Sensitive     CEFTRIAXONE <=0.25 SENSITIVE Sensitive     CIPROFLOXACIN <=0.25 SENSITIVE Sensitive     GENTAMICIN <=1 SENSITIVE Sensitive     IMIPENEM <=0.25 SENSITIVE Sensitive     NITROFURANTOIN 64 INTERMEDIATE Intermediate     TRIMETH/SULFA <=20 SENSITIVE Sensitive     AMPICILLIN/SULBACTAM 8 SENSITIVE Sensitive     PIP/TAZO <=4 SENSITIVE Sensitive     * >=100,000 COLONIES/mL KLEBSIELLA PNEUMONIAE    Labs: CBC: Recent Labs  Lab 10/02/22 0624 10/06/22 0723  WBC 7.0 6.5  HGB 9.9* 10.3*  HCT 30.7* 31.1*  MCV 93.0 91.2  PLT 222 286   Basic  Metabolic Panel: Recent Labs  Lab 10/02/22 0624 10/06/22 0723  NA 136 138  K 4.5 4.0  CL 106 105  CO2 23 23  GLUCOSE 150* 125*  BUN 19 32*  CREATININE 1.39* 1.36*  CALCIUM 8.6* 8.6*  MG 2.1 2.3   Liver Function Tests: No results for input(s): "AST", "ALT", "ALKPHOS", "BILITOT", "PROT", "ALBUMIN" in the last 168 hours.  CBG: Recent Labs  Lab 10/02/22 2155 10/03/22 0701 10/03/22 0817 10/03/22 1137 10/03/22 1622  GLUCAP 136* 133* 144* 156* 141*    Discharge time spent: greater than 30 minutes.  Signed: Pennie Banter, DO [facility] 10/08/2022

## 2022-10-08 DIAGNOSIS — N390 Urinary tract infection, site not specified: Secondary | ICD-10-CM | POA: Diagnosis not present

## 2022-10-08 DIAGNOSIS — M160 Bilateral primary osteoarthritis of hip: Secondary | ICD-10-CM | POA: Diagnosis not present

## 2022-10-08 DIAGNOSIS — E1122 Type 2 diabetes mellitus with diabetic chronic kidney disease: Secondary | ICD-10-CM | POA: Diagnosis not present

## 2022-10-08 DIAGNOSIS — Z9181 History of falling: Secondary | ICD-10-CM | POA: Diagnosis not present

## 2022-10-08 DIAGNOSIS — E785 Hyperlipidemia, unspecified: Secondary | ICD-10-CM | POA: Diagnosis not present

## 2022-10-08 DIAGNOSIS — N1831 Chronic kidney disease, stage 3a: Secondary | ICD-10-CM | POA: Diagnosis not present

## 2022-10-08 DIAGNOSIS — E1169 Type 2 diabetes mellitus with other specified complication: Secondary | ICD-10-CM | POA: Diagnosis not present

## 2022-10-08 DIAGNOSIS — R4189 Other symptoms and signs involving cognitive functions and awareness: Secondary | ICD-10-CM | POA: Diagnosis not present

## 2022-10-08 DIAGNOSIS — S42212D Unspecified displaced fracture of surgical neck of left humerus, subsequent encounter for fracture with routine healing: Secondary | ICD-10-CM | POA: Diagnosis not present

## 2022-10-08 DIAGNOSIS — E512 Wernicke's encephalopathy: Secondary | ICD-10-CM | POA: Diagnosis not present

## 2022-10-08 DIAGNOSIS — S7001XD Contusion of right hip, subsequent encounter: Secondary | ICD-10-CM | POA: Diagnosis not present

## 2022-10-08 DIAGNOSIS — R531 Weakness: Secondary | ICD-10-CM | POA: Diagnosis not present

## 2022-10-14 DIAGNOSIS — F0390 Unspecified dementia without behavioral disturbance: Secondary | ICD-10-CM | POA: Diagnosis not present

## 2022-10-14 DIAGNOSIS — Z712 Person consulting for explanation of examination or test findings: Secondary | ICD-10-CM | POA: Diagnosis not present

## 2022-10-14 DIAGNOSIS — F04 Amnestic disorder due to known physiological condition: Secondary | ICD-10-CM | POA: Diagnosis not present

## 2022-10-14 DIAGNOSIS — S37009D Unspecified injury of unspecified kidney, subsequent encounter: Secondary | ICD-10-CM | POA: Diagnosis not present

## 2022-10-14 DIAGNOSIS — E119 Type 2 diabetes mellitus without complications: Secondary | ICD-10-CM | POA: Diagnosis not present

## 2022-10-14 DIAGNOSIS — K5900 Constipation, unspecified: Secondary | ICD-10-CM | POA: Diagnosis not present

## 2022-10-20 DIAGNOSIS — S37009D Unspecified injury of unspecified kidney, subsequent encounter: Secondary | ICD-10-CM | POA: Diagnosis not present

## 2022-10-21 DIAGNOSIS — E1122 Type 2 diabetes mellitus with diabetic chronic kidney disease: Secondary | ICD-10-CM | POA: Diagnosis not present

## 2022-10-21 DIAGNOSIS — R531 Weakness: Secondary | ICD-10-CM | POA: Diagnosis not present

## 2022-10-21 DIAGNOSIS — N1831 Chronic kidney disease, stage 3a: Secondary | ICD-10-CM | POA: Diagnosis not present

## 2022-10-21 DIAGNOSIS — E785 Hyperlipidemia, unspecified: Secondary | ICD-10-CM | POA: Diagnosis not present

## 2022-10-21 DIAGNOSIS — M160 Bilateral primary osteoarthritis of hip: Secondary | ICD-10-CM | POA: Diagnosis not present

## 2022-10-21 DIAGNOSIS — S7001XD Contusion of right hip, subsequent encounter: Secondary | ICD-10-CM | POA: Diagnosis not present

## 2022-10-21 DIAGNOSIS — E512 Wernicke's encephalopathy: Secondary | ICD-10-CM | POA: Diagnosis not present

## 2022-10-21 DIAGNOSIS — N390 Urinary tract infection, site not specified: Secondary | ICD-10-CM | POA: Diagnosis not present

## 2022-10-21 DIAGNOSIS — E1169 Type 2 diabetes mellitus with other specified complication: Secondary | ICD-10-CM | POA: Diagnosis not present

## 2022-10-21 DIAGNOSIS — S42212D Unspecified displaced fracture of surgical neck of left humerus, subsequent encounter for fracture with routine healing: Secondary | ICD-10-CM | POA: Diagnosis not present

## 2022-10-21 DIAGNOSIS — Z9181 History of falling: Secondary | ICD-10-CM | POA: Diagnosis not present

## 2022-10-21 DIAGNOSIS — R4189 Other symptoms and signs involving cognitive functions and awareness: Secondary | ICD-10-CM | POA: Diagnosis not present

## 2022-10-24 ENCOUNTER — Emergency Department: Payer: Medicaid Other

## 2022-10-24 ENCOUNTER — Emergency Department
Admission: EM | Admit: 2022-10-24 | Discharge: 2022-10-24 | Disposition: A | Discharge status: Home / Self Care | Payer: Medicaid Other | Attending: Emergency Medicine | Admitting: Emergency Medicine

## 2022-10-24 DIAGNOSIS — M79604 Pain in right leg: Secondary | ICD-10-CM | POA: Diagnosis not present

## 2022-10-24 DIAGNOSIS — R464 Slowness and poor responsiveness: Secondary | ICD-10-CM | POA: Insufficient documentation

## 2022-10-24 DIAGNOSIS — E119 Type 2 diabetes mellitus without complications: Secondary | ICD-10-CM | POA: Diagnosis not present

## 2022-10-24 DIAGNOSIS — W19XXXA Unspecified fall, initial encounter: Secondary | ICD-10-CM | POA: Diagnosis not present

## 2022-10-24 DIAGNOSIS — R0902 Hypoxemia: Secondary | ICD-10-CM | POA: Diagnosis not present

## 2022-10-24 DIAGNOSIS — R4182 Altered mental status, unspecified: Secondary | ICD-10-CM | POA: Diagnosis not present

## 2022-10-24 DIAGNOSIS — R1084 Generalized abdominal pain: Secondary | ICD-10-CM | POA: Diagnosis not present

## 2022-10-24 DIAGNOSIS — M25552 Pain in left hip: Secondary | ICD-10-CM | POA: Insufficient documentation

## 2022-10-24 DIAGNOSIS — R55 Syncope and collapse: Secondary | ICD-10-CM | POA: Diagnosis not present

## 2022-10-24 LAB — CBC WITH DIFFERENTIAL/PLATELET
Abs Immature Granulocytes: 0.04 10*3/uL (ref 0.00–0.07)
Basophils Absolute: 0 10*3/uL (ref 0.0–0.1)
Basophils Relative: 0 %
Eosinophils Absolute: 0.1 10*3/uL (ref 0.0–0.5)
Eosinophils Relative: 1 %
HCT: 38.9 % (ref 36.0–46.0)
Hemoglobin: 12.4 g/dL (ref 12.0–15.0)
Immature Granulocytes: 0 %
Lymphocytes Relative: 12 %
Lymphs Abs: 1.1 10*3/uL (ref 0.7–4.0)
MCH: 29.6 pg (ref 26.0–34.0)
MCHC: 31.9 g/dL (ref 30.0–36.0)
MCV: 92.8 fL (ref 80.0–100.0)
Monocytes Absolute: 0.5 10*3/uL (ref 0.1–1.0)
Monocytes Relative: 5 %
Neutro Abs: 7.2 10*3/uL (ref 1.7–7.7)
Neutrophils Relative %: 82 %
Platelets: 425 10*3/uL — ABNORMAL HIGH (ref 150–400)
RBC: 4.19 MIL/uL (ref 3.87–5.11)
RDW: 13.4 % (ref 11.5–15.5)
WBC: 8.9 10*3/uL (ref 4.0–10.5)
nRBC: 0 % (ref 0.0–0.2)

## 2022-10-24 LAB — COMPREHENSIVE METABOLIC PANEL
ALT: 25 U/L (ref 0–44)
AST: 27 U/L (ref 15–41)
Albumin: 4.1 g/dL (ref 3.5–5.0)
Alkaline Phosphatase: 108 U/L (ref 38–126)
Anion gap: 14 (ref 5–15)
BUN: 24 mg/dL — ABNORMAL HIGH (ref 6–20)
CO2: 18 mmol/L — ABNORMAL LOW (ref 22–32)
Calcium: 9.3 mg/dL (ref 8.9–10.3)
Chloride: 106 mmol/L (ref 98–111)
Creatinine, Ser: 1.38 mg/dL — ABNORMAL HIGH (ref 0.44–1.00)
GFR, Estimated: 45 mL/min — ABNORMAL LOW (ref >60)
Glucose, Bld: 115 mg/dL — ABNORMAL HIGH (ref 70–99)
Potassium: 4.1 mmol/L (ref 3.5–5.1)
Sodium: 138 mmol/L (ref 135–145)
Total Bilirubin: 1.2 mg/dL (ref 0.3–1.2)
Total Protein: 7.7 g/dL (ref 6.5–8.1)

## 2022-10-24 LAB — TROPONIN I (HIGH SENSITIVITY): Troponin I (High Sensitivity): 4 ng/L (ref <18)

## 2022-10-24 MED ORDER — SODIUM CHLORIDE 0.9 % IV BOLUS
1000.0000 mL | Freq: Once | INTRAVENOUS | Status: AC
  Administered 2022-10-24: 1000 mL via INTRAVENOUS

## 2022-10-24 NOTE — Discharge Instructions (Addendum)
You were seen in the emergency department today for evaluation after an episode of passing out.  Your lab work, EKG, head CT, and x-Mackey Varricchio of your hip were reassuring.  We did not find any injuries from your fall.  Please continue to follow-up with your outpatient providers for further evaluation.  Please return to the ER for any new or worsening symptoms.

## 2022-10-24 NOTE — ED Provider Notes (Signed)
[facility] Provider Note    Event Date/Time   First MD Initiated Contact with Patient 10/24/22 0940     (approximate)   History   Loss of Consciousness   HPI  Deanna Joseph is a 57 y.o. female with history of T2DM, cognitive decline presenting to the emergency department for evaluation after a fall.  Patient reports history of memory issues and is unable to recall the event.  Per EMS, patient had a syncopal episode landing on her left side.  She does report pain over her left hip.  Does have a known humerus fracture for which she is in a sling.  Patient unsure if she hit her head.  Denies head or neck pain.  Denies pain in other areas.  Further history later obtained from husband.  He reports that he heard a thud while patient was in the bathroom and she was found/over to her right side.  She was briefly unresponsive for a few seconds.  He reports that this is nearly identical to 2 prior episodes that occurred before patient's recent admission for syncope.  They are awaiting outpatient follow-up with neurology, has seen her primary care doctor.     Physical Exam   Triage Vital Signs: ED Triage Vitals  Enc Vitals Group     BP 10/24/22 0945 123/61     Pulse Rate 10/24/22 0945 77     Resp 10/24/22 0945 18     Temp 10/24/22 0945 98.4 F (36.9 C)     Temp Source 10/24/22 0945 Oral     SpO2 10/24/22 0945 100 %     Weight 10/24/22 0951 171 lb 15.3 oz (78 kg)     Height --      Head Circumference --      Peak Flow --      Pain Score 10/24/22 0951 2     Pain Loc --      Pain Edu? --      Excl. in GC? --     Most recent vital signs: Vitals:   10/24/22 0945  BP: 123/61  Pulse: 77  Resp: 18  Temp: 98.4 F (36.9 C)  SpO2: 100%   Nursing notes and vital signs reviewed.  General: Adult female, sitting upright in bed, awake, and reactive Head: Atraumatic Chest: Symmetric chest rise, no tenderness to palpation.  Cardiac: Regular rhythm and rate.   Respiratory: Lungs clear to auscultation Abdomen: Soft, nondistended. No tenderness to palpation.  Pelvis: Stable in AP and lateral compression.  There is tenderness palpation over the left hip, but patient is able to range at the hip joint without significant pain  MSK: No deformity to bilateral upper and lower extremity. Full range of motion to bilateral upper lower extremity  Neuro: Alert, oriented but confused about situation.  Normal sensation to light touch in bilateral upper and lower extremity. Skin: No evidence of burns or lacerations.  ED Results / Procedures / Treatments   Labs (all labs ordered are listed, but only abnormal results are displayed) Labs Reviewed  COMPREHENSIVE METABOLIC PANEL - Abnormal; Notable for the following components:      Result Value   CO2 18 (*)    Glucose, Bld 115 (*)    BUN 24 (*)    Creatinine, Ser 1.38 (*)    GFR, Estimated 45 (*)    All other components within normal limits  CBC WITH DIFFERENTIAL/PLATELET - Abnormal; Notable for the following components:   Platelets 425 (*)  All other components within normal limits  TROPONIN I (HIGH SENSITIVITY)     EKG EKG independently reviewed interpreted by myself (ER attending) demonstrates:  EKG demonstrates sinus rhythm at a rate of 80, PR 142, QRS 78, QTc 397, no acute ST changes  RADIOLOGY Imaging independently reviewed and interpreted by myself demonstrates:  Left hip x-Jaima Janney without acute fracture CT head without acute abnormality  PROCEDURES:  Critical Care performed: No  Procedures   MEDICATIONS ORDERED IN ED: Medications  sodium chloride 0.9 % bolus 1,000 mL (0 mLs Intravenous Stopped 10/24/22 1202)     IMPRESSION / MDM / ASSESSMENT AND PLAN / ED COURSE  I reviewed the triage vital signs and the nursing notes.  Differential diagnosis includes, but is not limited to, recurrent syncope due to hypotension from medications, orthostasis, consideration for arrhythmia, lower  suspicion for structural heart or acute intracranial abnormality given recent reassuring workup  Patient's presentation is most consistent with acute presentation with potential threat to life or bodily function.  57 year old female presenting with a recurrent syncopal episode.  Limited historian, will obtain CT head as well as left hip x-Phylis Javed.  Will also obtain blood work, EKG.  Did have recent admission for same with reassuring echo and MRI brain.  Imaging reassuring.  Blood work without severe derangement.  Patient reevaluated.  Husband now present at bedside.  Discussed extensive recent workup and consideration for readmission.  After discussion of this, we will plan for for an ambulation trial and if patient tolerates this, discharge with continued outpatient follow-up.  Patient was able to ambulate without recurrent presyncopal symptoms.  Family is comfortable with discharge home.  Strict return precautions provided.     FINAL CLINICAL IMPRESSION(S) / ED DIAGNOSES   Final diagnoses:  Syncope, unspecified syncope type     Rx / DC Orders   ED Discharge Orders     None        Note:  This document was prepared using Dragon voice recognition software and may include unintentional dictation errors.   Trinna Post, MD 10/24/22 1539

## 2022-10-24 NOTE — ED Triage Notes (Signed)
Pt presents via EMS c/o syncopal episode at home. EMS report husband heard pt fall in restroom and found pt slumped over. Pt has hx of dementia. Alert and oriented to baseline at this time.

## 2022-10-26 DIAGNOSIS — S42295A Other nondisplaced fracture of upper end of left humerus, initial encounter for closed fracture: Secondary | ICD-10-CM | POA: Diagnosis not present

## 2022-10-26 DIAGNOSIS — M25512 Pain in left shoulder: Secondary | ICD-10-CM | POA: Diagnosis not present

## 2022-10-26 DIAGNOSIS — G63 Polyneuropathy in diseases classified elsewhere: Secondary | ICD-10-CM | POA: Diagnosis not present

## 2022-11-04 DIAGNOSIS — E538 Deficiency of other specified B group vitamins: Secondary | ICD-10-CM | POA: Diagnosis not present

## 2022-11-04 DIAGNOSIS — E785 Hyperlipidemia, unspecified: Secondary | ICD-10-CM | POA: Diagnosis not present

## 2022-11-04 DIAGNOSIS — R0600 Dyspnea, unspecified: Secondary | ICD-10-CM | POA: Diagnosis not present

## 2022-11-04 DIAGNOSIS — E119 Type 2 diabetes mellitus without complications: Secondary | ICD-10-CM | POA: Diagnosis not present

## 2022-11-04 DIAGNOSIS — R55 Syncope and collapse: Secondary | ICD-10-CM | POA: Diagnosis not present

## 2022-11-04 DIAGNOSIS — F411 Generalized anxiety disorder: Secondary | ICD-10-CM | POA: Diagnosis not present

## 2022-11-04 DIAGNOSIS — Z712 Person consulting for explanation of examination or test findings: Secondary | ICD-10-CM | POA: Diagnosis not present

## 2022-11-04 DIAGNOSIS — F0390 Unspecified dementia without behavioral disturbance: Secondary | ICD-10-CM | POA: Diagnosis not present

## 2022-11-05 DIAGNOSIS — E785 Hyperlipidemia, unspecified: Secondary | ICD-10-CM | POA: Diagnosis not present

## 2022-11-05 DIAGNOSIS — S42212D Unspecified displaced fracture of surgical neck of left humerus, subsequent encounter for fracture with routine healing: Secondary | ICD-10-CM | POA: Diagnosis not present

## 2022-11-05 DIAGNOSIS — S7001XD Contusion of right hip, subsequent encounter: Secondary | ICD-10-CM | POA: Diagnosis not present

## 2022-11-05 DIAGNOSIS — R4189 Other symptoms and signs involving cognitive functions and awareness: Secondary | ICD-10-CM | POA: Diagnosis not present

## 2022-11-05 DIAGNOSIS — E512 Wernicke's encephalopathy: Secondary | ICD-10-CM | POA: Diagnosis not present

## 2022-11-05 DIAGNOSIS — E1169 Type 2 diabetes mellitus with other specified complication: Secondary | ICD-10-CM | POA: Diagnosis not present

## 2022-11-05 DIAGNOSIS — N1831 Chronic kidney disease, stage 3a: Secondary | ICD-10-CM | POA: Diagnosis not present

## 2022-11-05 DIAGNOSIS — R531 Weakness: Secondary | ICD-10-CM | POA: Diagnosis not present

## 2022-11-05 DIAGNOSIS — M160 Bilateral primary osteoarthritis of hip: Secondary | ICD-10-CM | POA: Diagnosis not present

## 2022-11-05 DIAGNOSIS — E1122 Type 2 diabetes mellitus with diabetic chronic kidney disease: Secondary | ICD-10-CM | POA: Diagnosis not present

## 2022-11-05 DIAGNOSIS — Z9181 History of falling: Secondary | ICD-10-CM | POA: Diagnosis not present

## 2022-11-05 DIAGNOSIS — N390 Urinary tract infection, site not specified: Secondary | ICD-10-CM | POA: Diagnosis not present

## 2022-11-11 DIAGNOSIS — S7001XD Contusion of right hip, subsequent encounter: Secondary | ICD-10-CM | POA: Diagnosis not present

## 2022-11-11 DIAGNOSIS — E1122 Type 2 diabetes mellitus with diabetic chronic kidney disease: Secondary | ICD-10-CM | POA: Diagnosis not present

## 2022-11-11 DIAGNOSIS — E512 Wernicke's encephalopathy: Secondary | ICD-10-CM | POA: Diagnosis not present

## 2022-11-11 DIAGNOSIS — N1831 Chronic kidney disease, stage 3a: Secondary | ICD-10-CM | POA: Diagnosis not present

## 2022-11-11 DIAGNOSIS — N390 Urinary tract infection, site not specified: Secondary | ICD-10-CM | POA: Diagnosis not present

## 2022-11-11 DIAGNOSIS — Z9181 History of falling: Secondary | ICD-10-CM | POA: Diagnosis not present

## 2022-11-11 DIAGNOSIS — S42212D Unspecified displaced fracture of surgical neck of left humerus, subsequent encounter for fracture with routine healing: Secondary | ICD-10-CM | POA: Diagnosis not present

## 2022-11-11 DIAGNOSIS — E785 Hyperlipidemia, unspecified: Secondary | ICD-10-CM | POA: Diagnosis not present

## 2022-11-11 DIAGNOSIS — R531 Weakness: Secondary | ICD-10-CM | POA: Diagnosis not present

## 2022-11-11 DIAGNOSIS — R4189 Other symptoms and signs involving cognitive functions and awareness: Secondary | ICD-10-CM | POA: Diagnosis not present

## 2022-11-11 DIAGNOSIS — E1169 Type 2 diabetes mellitus with other specified complication: Secondary | ICD-10-CM | POA: Diagnosis not present

## 2022-11-11 DIAGNOSIS — M160 Bilateral primary osteoarthritis of hip: Secondary | ICD-10-CM | POA: Diagnosis not present

## 2022-11-17 DIAGNOSIS — E512 Wernicke's encephalopathy: Secondary | ICD-10-CM | POA: Diagnosis not present

## 2022-11-17 DIAGNOSIS — S7001XD Contusion of right hip, subsequent encounter: Secondary | ICD-10-CM | POA: Diagnosis not present

## 2022-11-17 DIAGNOSIS — E1122 Type 2 diabetes mellitus with diabetic chronic kidney disease: Secondary | ICD-10-CM | POA: Diagnosis not present

## 2022-11-17 DIAGNOSIS — N1831 Chronic kidney disease, stage 3a: Secondary | ICD-10-CM | POA: Diagnosis not present

## 2022-11-17 DIAGNOSIS — R4189 Other symptoms and signs involving cognitive functions and awareness: Secondary | ICD-10-CM | POA: Diagnosis not present

## 2022-11-17 DIAGNOSIS — E785 Hyperlipidemia, unspecified: Secondary | ICD-10-CM | POA: Diagnosis not present

## 2022-11-17 DIAGNOSIS — N390 Urinary tract infection, site not specified: Secondary | ICD-10-CM | POA: Diagnosis not present

## 2022-11-17 DIAGNOSIS — M160 Bilateral primary osteoarthritis of hip: Secondary | ICD-10-CM | POA: Diagnosis not present

## 2022-11-17 DIAGNOSIS — E1169 Type 2 diabetes mellitus with other specified complication: Secondary | ICD-10-CM | POA: Diagnosis not present

## 2022-11-17 DIAGNOSIS — R531 Weakness: Secondary | ICD-10-CM | POA: Diagnosis not present

## 2022-11-17 DIAGNOSIS — Z9181 History of falling: Secondary | ICD-10-CM | POA: Diagnosis not present

## 2022-11-17 DIAGNOSIS — S42212D Unspecified displaced fracture of surgical neck of left humerus, subsequent encounter for fracture with routine healing: Secondary | ICD-10-CM | POA: Diagnosis not present

## 2022-11-24 DIAGNOSIS — M160 Bilateral primary osteoarthritis of hip: Secondary | ICD-10-CM | POA: Diagnosis not present

## 2022-11-24 DIAGNOSIS — E1122 Type 2 diabetes mellitus with diabetic chronic kidney disease: Secondary | ICD-10-CM | POA: Diagnosis not present

## 2022-11-24 DIAGNOSIS — S42212D Unspecified displaced fracture of surgical neck of left humerus, subsequent encounter for fracture with routine healing: Secondary | ICD-10-CM | POA: Diagnosis not present

## 2022-11-24 DIAGNOSIS — R531 Weakness: Secondary | ICD-10-CM | POA: Diagnosis not present

## 2022-11-24 DIAGNOSIS — E785 Hyperlipidemia, unspecified: Secondary | ICD-10-CM | POA: Diagnosis not present

## 2022-11-24 DIAGNOSIS — S7001XD Contusion of right hip, subsequent encounter: Secondary | ICD-10-CM | POA: Diagnosis not present

## 2022-11-24 DIAGNOSIS — E512 Wernicke's encephalopathy: Secondary | ICD-10-CM | POA: Diagnosis not present

## 2022-11-24 DIAGNOSIS — R4189 Other symptoms and signs involving cognitive functions and awareness: Secondary | ICD-10-CM | POA: Diagnosis not present

## 2022-11-24 DIAGNOSIS — Z9181 History of falling: Secondary | ICD-10-CM | POA: Diagnosis not present

## 2022-11-24 DIAGNOSIS — N390 Urinary tract infection, site not specified: Secondary | ICD-10-CM | POA: Diagnosis not present

## 2022-11-24 DIAGNOSIS — E1169 Type 2 diabetes mellitus with other specified complication: Secondary | ICD-10-CM | POA: Diagnosis not present

## 2022-11-24 DIAGNOSIS — N1831 Chronic kidney disease, stage 3a: Secondary | ICD-10-CM | POA: Diagnosis not present

## 2022-12-02 DIAGNOSIS — S42295A Other nondisplaced fracture of upper end of left humerus, initial encounter for closed fracture: Secondary | ICD-10-CM | POA: Diagnosis not present

## 2022-12-02 DIAGNOSIS — M25512 Pain in left shoulder: Secondary | ICD-10-CM | POA: Diagnosis not present

## 2022-12-16 DIAGNOSIS — E119 Type 2 diabetes mellitus without complications: Secondary | ICD-10-CM | POA: Diagnosis not present

## 2022-12-16 DIAGNOSIS — M129 Arthropathy, unspecified: Secondary | ICD-10-CM | POA: Diagnosis not present

## 2022-12-16 DIAGNOSIS — Z712 Person consulting for explanation of examination or test findings: Secondary | ICD-10-CM | POA: Diagnosis not present

## 2022-12-16 DIAGNOSIS — F0390 Unspecified dementia without behavioral disturbance: Secondary | ICD-10-CM | POA: Diagnosis not present

## 2022-12-16 DIAGNOSIS — K219 Gastro-esophageal reflux disease without esophagitis: Secondary | ICD-10-CM | POA: Diagnosis not present

## 2022-12-30 DIAGNOSIS — S42295A Other nondisplaced fracture of upper end of left humerus, initial encounter for closed fracture: Secondary | ICD-10-CM | POA: Diagnosis not present

## 2023-01-05 DIAGNOSIS — Z1331 Encounter for screening for depression: Secondary | ICD-10-CM | POA: Diagnosis not present

## 2023-01-05 DIAGNOSIS — R600 Localized edema: Secondary | ICD-10-CM | POA: Diagnosis not present

## 2023-01-05 DIAGNOSIS — E119 Type 2 diabetes mellitus without complications: Secondary | ICD-10-CM | POA: Diagnosis not present

## 2023-01-05 DIAGNOSIS — Z1389 Encounter for screening for other disorder: Secondary | ICD-10-CM | POA: Diagnosis not present

## 2023-01-05 DIAGNOSIS — Z712 Person consulting for explanation of examination or test findings: Secondary | ICD-10-CM | POA: Diagnosis not present

## 2023-01-06 ENCOUNTER — Emergency Department
Admission: EM | Admit: 2023-01-06 | Discharge: 2023-01-06 | Disposition: A | Discharge status: Home / Self Care | Payer: Medicaid Other | Attending: Emergency Medicine | Admitting: Emergency Medicine

## 2023-01-06 ENCOUNTER — Emergency Department: Payer: Medicaid Other

## 2023-01-06 ENCOUNTER — Other Ambulatory Visit: Payer: Self-pay

## 2023-01-06 ENCOUNTER — Emergency Department
Admission: EM | Admit: 2023-01-06 | Discharge: 2023-01-06 | Discharge status: Against Medical Advice | Payer: Medicaid Other | Attending: Emergency Medicine | Admitting: Emergency Medicine

## 2023-01-06 DIAGNOSIS — R0602 Shortness of breath: Secondary | ICD-10-CM | POA: Diagnosis not present

## 2023-01-06 DIAGNOSIS — R600 Localized edema: Secondary | ICD-10-CM | POA: Insufficient documentation

## 2023-01-06 DIAGNOSIS — I2510 Atherosclerotic heart disease of native coronary artery without angina pectoris: Secondary | ICD-10-CM | POA: Diagnosis not present

## 2023-01-06 DIAGNOSIS — R0789 Other chest pain: Secondary | ICD-10-CM | POA: Insufficient documentation

## 2023-01-06 DIAGNOSIS — I509 Heart failure, unspecified: Secondary | ICD-10-CM | POA: Insufficient documentation

## 2023-01-06 DIAGNOSIS — R079 Chest pain, unspecified: Secondary | ICD-10-CM | POA: Diagnosis not present

## 2023-01-06 DIAGNOSIS — M791 Myalgia, unspecified site: Secondary | ICD-10-CM | POA: Insufficient documentation

## 2023-01-06 DIAGNOSIS — Z5321 Procedure and treatment not carried out due to patient leaving prior to being seen by health care provider: Secondary | ICD-10-CM | POA: Diagnosis not present

## 2023-01-06 DIAGNOSIS — R0603 Acute respiratory distress: Secondary | ICD-10-CM | POA: Diagnosis not present

## 2023-01-06 DIAGNOSIS — R918 Other nonspecific abnormal finding of lung field: Secondary | ICD-10-CM | POA: Diagnosis not present

## 2023-01-06 LAB — CBC WITH DIFFERENTIAL/PLATELET
Abs Immature Granulocytes: 0.05 10*3/uL (ref 0.00–0.07)
Basophils Absolute: 0 10*3/uL (ref 0.0–0.1)
Basophils Relative: 0 %
Eosinophils Absolute: 0 10*3/uL (ref 0.0–0.5)
Eosinophils Relative: 0 %
HCT: 29.8 % — ABNORMAL LOW (ref 36.0–46.0)
Hemoglobin: 10.1 g/dL — ABNORMAL LOW (ref 12.0–15.0)
Immature Granulocytes: 1 %
Lymphocytes Relative: 14 %
Lymphs Abs: 1.2 10*3/uL (ref 0.7–4.0)
MCH: 30.8 pg (ref 26.0–34.0)
MCHC: 33.9 g/dL (ref 30.0–36.0)
MCV: 90.9 fL (ref 80.0–100.0)
Monocytes Absolute: 0.7 10*3/uL (ref 0.1–1.0)
Monocytes Relative: 8 %
Neutro Abs: 6.5 10*3/uL (ref 1.7–7.7)
Neutrophils Relative %: 77 %
Platelets: 268 10*3/uL (ref 150–400)
RBC: 3.28 MIL/uL — ABNORMAL LOW (ref 3.87–5.11)
RDW: 13.7 % (ref 11.5–15.5)
WBC: 8.5 10*3/uL (ref 4.0–10.5)
nRBC: 0 % (ref 0.0–0.2)

## 2023-01-06 LAB — CBC
HCT: 32.3 % — ABNORMAL LOW (ref 36.0–46.0)
Hemoglobin: 10.9 g/dL — ABNORMAL LOW (ref 12.0–15.0)
MCH: 31.2 pg (ref 26.0–34.0)
MCHC: 33.7 g/dL (ref 30.0–36.0)
MCV: 92.6 fL (ref 80.0–100.0)
Platelets: 307 10*3/uL (ref 150–400)
RBC: 3.49 MIL/uL — ABNORMAL LOW (ref 3.87–5.11)
RDW: 13.9 % (ref 11.5–15.5)
WBC: 10.6 10*3/uL — ABNORMAL HIGH (ref 4.0–10.5)
nRBC: 0 % (ref 0.0–0.2)

## 2023-01-06 LAB — BRAIN NATRIURETIC PEPTIDE
B Natriuretic Peptide: 108.8 pg/mL — ABNORMAL HIGH (ref 0.0–100.0)
B Natriuretic Peptide: 131.7 pg/mL — ABNORMAL HIGH (ref 0.0–100.0)

## 2023-01-06 LAB — D-DIMER, QUANTITATIVE: D-Dimer, Quant: 0.51 ug/mL-FEU — ABNORMAL HIGH (ref 0.00–0.50)

## 2023-01-06 LAB — COMPREHENSIVE METABOLIC PANEL
ALT: 16 U/L (ref 0–44)
AST: 22 U/L (ref 15–41)
Albumin: 3.5 g/dL (ref 3.5–5.0)
Alkaline Phosphatase: 72 U/L (ref 38–126)
Anion gap: 10 (ref 5–15)
BUN: 20 mg/dL (ref 6–20)
CO2: 22 mmol/L (ref 22–32)
Calcium: 8.3 mg/dL — ABNORMAL LOW (ref 8.9–10.3)
Chloride: 100 mmol/L (ref 98–111)
Creatinine, Ser: 1.09 mg/dL — ABNORMAL HIGH (ref 0.44–1.00)
GFR, Estimated: 60 mL/min — ABNORMAL LOW (ref >60)
Glucose, Bld: 200 mg/dL — ABNORMAL HIGH (ref 70–99)
Potassium: 4.1 mmol/L (ref 3.5–5.1)
Sodium: 132 mmol/L — ABNORMAL LOW (ref 135–145)
Total Bilirubin: 0.4 mg/dL (ref 0.3–1.2)
Total Protein: 6.8 g/dL (ref 6.5–8.1)

## 2023-01-06 LAB — BASIC METABOLIC PANEL
Anion gap: 10 (ref 5–15)
BUN: 23 mg/dL — ABNORMAL HIGH (ref 6–20)
CO2: 23 mmol/L (ref 22–32)
Calcium: 8.1 mg/dL — ABNORMAL LOW (ref 8.9–10.3)
Chloride: 98 mmol/L (ref 98–111)
Creatinine, Ser: 1.11 mg/dL — ABNORMAL HIGH (ref 0.44–1.00)
GFR, Estimated: 58 mL/min — ABNORMAL LOW (ref >60)
Glucose, Bld: 198 mg/dL — ABNORMAL HIGH (ref 70–99)
Potassium: 4.5 mmol/L (ref 3.5–5.1)
Sodium: 131 mmol/L — ABNORMAL LOW (ref 135–145)

## 2023-01-06 LAB — CBG MONITORING, ED: Glucose-Capillary: 184 mg/dL — ABNORMAL HIGH (ref 70–99)

## 2023-01-06 LAB — TROPONIN I (HIGH SENSITIVITY): Troponin I (High Sensitivity): 7 ng/L (ref <18)

## 2023-01-06 MED ORDER — IOHEXOL 350 MG/ML SOLN
75.0000 mL | Freq: Once | INTRAVENOUS | Status: AC | PRN
  Administered 2023-01-06: 75 mL via INTRAVENOUS

## 2023-01-06 MED ORDER — ALBUTEROL SULFATE HFA 108 (90 BASE) MCG/ACT IN AERS: 2.0000 | INHALATION_SPRAY | RESPIRATORY_TRACT | Status: DC | PRN

## 2023-01-06 MED ORDER — FUROSEMIDE 10 MG/ML IJ SOLN
20.0000 mg | Freq: Once | INTRAMUSCULAR | Status: AC
  Administered 2023-01-06: 20 mg via INTRAVENOUS
  Filled 2023-01-06: qty 4

## 2023-01-06 NOTE — Discharge Instructions (Addendum)
Please use your lasix as prescribed

## 2023-01-06 NOTE — ED Triage Notes (Signed)
Reports SOB since Tuesday, bil leg swelling since Sunday. Sent over by cardiologist for severe chest pain. PCP plans to start on lasix for new onset of CHF. Patient seen late last night in ED

## 2023-01-06 NOTE — ED Triage Notes (Signed)
Pt presents to ER with husband with c/o "pain all over" that has been going on for a few days, but has become worse tonight.  Pt denies any new injuries. States pain is worse in her chest at this time.  Pt has some increased welling in her feet.  Denies any increased weight gain or cardiac hx.  Pt endorses some sob.  Denies any recent sick contacts.  Pt is otherwise A&O x4 and in NAD at this time.

## 2023-01-06 NOTE — ED Notes (Signed)
Pt & husband to STAT desk upset over wait time; husband st to pt "it's obvious they aren't going to do anything and they are avoiding you, so let's leave"; pt updated on her wait time and assured that she will be going to an exam room when one is available; explained purpose of protocols; pt st that she has an appt at 9am with her cardiologist and is just going to leave

## 2023-01-06 NOTE — ED Notes (Signed)
Lab at bedside obtaining blood draw

## 2023-01-06 NOTE — ED Notes (Signed)
Pt states that she feels shaky, and is concerned that her blood sugar is low. MD Bradler notified.

## 2023-01-06 NOTE — ED Notes (Signed)
This RN attempted unsuccessfully to draw blood. Lab contacted to have phlebotomist come over to ED to draw blood

## 2023-01-06 NOTE — ED Notes (Signed)
Patient transported to CT 

## 2023-01-06 NOTE — ED Provider Notes (Signed)
[facility] Provider Note   Event Date/Time   First MD Initiated Contact with Patient 01/06/23 1014     (approximate) History  Shortness of Breath (Reports SOB since Tuesday, bil leg swelling since Sunday. Sent over by cardiologist for severe chest pain. PCP plans to start on lasix for new onset of CHF. Seen late last night )  HPI Deanna Joseph is a 57 y.o. female with a past medical history of new onset heart failure who presents from her cardiologist for severe chest pain with associated bilateral lower extremity swelling.  Patient also reports shortness of breath since Tuesday.  Patient states that her PCP was planning on starting Lasix however she has been unable to pick it up. ROS: Patient currently denies any vision changes, tinnitus, difficulty speaking, facial droop, sore throat, abdominal pain, nausea/vomiting/diarrhea, dysuria, or weakness/numbness/paresthesias in any extremity   Physical Exam  Triage Vital Signs: ED Triage Vitals  Encounter Vitals Group     BP 01/06/23 0936 (!) 150/78     Systolic BP Percentile --      Diastolic BP Percentile --      Pulse Rate 01/06/23 0936 95     Resp 01/06/23 0936 18     Temp 01/06/23 0936 98.5 F (36.9 C)     Temp Source 01/06/23 0936 Oral     SpO2 01/06/23 0936 100 %     Weight 01/06/23 0937 160 lb (72.6 kg)     Height 01/06/23 0937 5' (1.524 m)     Head Circumference --      Peak Flow --      Pain Score 01/06/23 0942 8     Pain Loc --      Pain Education --      Exclude from Growth Chart --    Most recent vital signs: Vitals:   01/06/23 1330 01/06/23 1359  BP: (!) 160/75   Pulse: (!) 105   Resp: 16   Temp:  98 F (36.7 C)  SpO2: 100%    General: Awake, oriented x4. CV:  Good peripheral perfusion.  Resp:  Normal effort.  Abd:  No distention.  Other:  Obese middle-aged Caucasian female laying in bed in no acute distress ED Results / Procedures / Treatments  Labs (all labs ordered are  listed, but only abnormal results are displayed) Labs Reviewed  CBC WITH DIFFERENTIAL/PLATELET - Abnormal; Notable for the following components:      Result Value   RBC 3.28 (*)    Hemoglobin 10.1 (*)    HCT 29.8 (*)    All other components within normal limits  BASIC METABOLIC PANEL - Abnormal; Notable for the following components:   Sodium 131 (*)    Glucose, Bld 198 (*)    BUN 23 (*)    Creatinine, Ser 1.11 (*)    Calcium 8.1 (*)    GFR, Estimated 58 (*)    All other components within normal limits  BRAIN NATRIURETIC PEPTIDE - Abnormal; Notable for the following components:   B Natriuretic Peptide 131.7 (*)    All other components within normal limits  D-DIMER, QUANTITATIVE - Abnormal; Notable for the following components:   D-Dimer, Quant 0.51 (*)    All other components within normal limits  CBG MONITORING, ED - Abnormal; Notable for the following components:   Glucose-Capillary 184 (*)    All other components within normal limits   EKG ED ECG REPORT I, Merwyn Katos, the attending physician, personally viewed  and interpreted this ECG. Date: 01/06/2023 EKG Time: 0940 Rate: 95 Rhythm: normal sinus rhythm QRS Axis: normal Intervals: normal ST/T Wave abnormalities: normal Narrative Interpretation: no evidence of acute ischemia RADIOLOGY ED MD interpretation: 2 view chest x-ray interpreted by me shows no evidence of acute abnormalities including no pneumonia, pneumothorax, or widened mediastinum  CT angiography of the chest interpreted independently by me and shows no evidence of acute abnormalities -Agree with radiology assessment Official radiology report(s): CT Angio Chest PE W/Cm &/Or Wo Cm  Result Date: 01/06/2023 CLINICAL DATA:  Severe chest pain. New onset CHF. Evaluate for pulmonary embolism. EXAM: CT ANGIOGRAPHY CHEST WITH CONTRAST TECHNIQUE: Multidetector CT imaging of the chest was performed using the standard protocol during bolus administration of  intravenous contrast. Multiplanar CT image reconstructions and MIPs were obtained to evaluate the vascular anatomy. RADIATION DOSE REDUCTION: This exam was performed according to the departmental dose-optimization program which includes automated exposure control, adjustment of the mA and/or kV according to patient size and/or use of iterative reconstruction technique. CONTRAST:  75mL OMNIPAQUE IOHEXOL 350 MG/ML SOLN COMPARISON:  Chest radiograph-01/06/2023 FINDINGS: Vascular Findings: There is adequate opacification of the pulmonary arterial system with the main pulmonary artery measuring 433 Hounsfield units. There are no discrete filling defects within the pulmonary arterial tree to suggest pulmonary embolism. Normal caliber of the main pulmonary artery. Normal heart size. No pericardial effusion. Coronary artery calcifications. No evidence of thoracic aortic aneurysm or dissection on this nongated examination. Bovine configuration of the aortic arch. The branch vessels of the aortic arch appear widely patent throughout their imaged courses. Review of the MIP images confirms the above findings. ---------------------------------------------------------------------------------- Nonvascular Findings: Mediastinum/Lymph Nodes: Scattered mediastinal lymph nodes are not enlarged by size criteria, presumably reactive in etiology. No bulky mediastinal, hilar or axillary lymphadenopathy. Lungs/Pleura: No focal airspace opacities. No pleural effusion or pneumothorax. The central pulmonary airways appear widely patent. Punctate granuloma are seen within the lungs bilaterally (images 45, 63 and 68, series 6). No discrete worrisome pulmonary nodules. Upper abdomen: Limited early arterial phase evaluation of the upper abdomen demonstrates mild nodularity of the hepatic contour as could be seen in the setting of early cirrhotic change. Postcholecystectomy. Small hiatal hernia. Debris is seen scattered throughout the esophagus  without definitive evidence of esophageal wall thickening. Musculoskeletal: No acute or aggressive osseous abnormalities. Normal appearance of the thyroid. Regional soft tissues appear normal. IMPRESSION: 1. No acute cardiopulmonary disease. Specifically, no evidence of pulmonary embolism. 2. Coronary artery calcifications. Aortic Atherosclerosis (ICD10-I70.0). 3. Small hiatal hernia with debris scattered throughout the esophagus, nonspecific though could be seen in the setting of gastroesophageal reflux. 4. Mild nodularity of the hepatic contour as could be seen in the setting of early cirrhotic change. Correlation with LFTs is advised. Electronically Signed   By: Simonne Come M.D.   On: 01/06/2023 12:47   DG Chest 2 View  Result Date: 01/06/2023 CLINICAL DATA:  sob EXAM: CHEST - 2 VIEW COMPARISON:  CXR 01/06/23 FINDINGS: The heart size and mediastinal contours are within normal limits. Both lungs are clear. The visualized skeletal structures are unremarkable. Surgical clips in the right upper quadrant IMPRESSION: No focal airspace opacity Electronically Signed   By: Lorenza Cambridge M.D.   On: 01/06/2023 10:16   DG Chest 1 View  Result Date: 01/06/2023 CLINICAL DATA:  Chest pain EXAM: CHEST  1 VIEW COMPARISON:  09/30/2022 FINDINGS: The heart size and mediastinal contours are within normal limits. Both lungs are clear. The visualized skeletal structures are  unremarkable. IMPRESSION: No active disease. Electronically Signed   By: Jasmine Pang M.D.   On: 01/06/2023 01:39   PROCEDURES: Critical Care performed: No Procedures MEDICATIONS ORDERED IN ED: Medications  albuterol (VENTOLIN HFA) 108 (90 Base) MCG/ACT inhaler 2 puff (has no administration in time range)  furosemide (LASIX) injection 20 mg (20 mg Intravenous Given 01/06/23 1152)  iohexol (OMNIPAQUE) 350 MG/ML injection 75 mL (75 mLs Intravenous Contrast Given 01/06/23 1157)   IMPRESSION / MDM / ASSESSMENT AND PLAN / ED COURSE  I reviewed the triage  vital signs and the nursing notes.                             The patient is on the cardiac monitor to evaluate for evidence of arrhythmia and/or significant heart rate changes. Patient's presentation is most consistent with acute presentation with potential threat to life or bodily function. Endorses dyspnea, endorses LE edema Endorses Non adherence to medication regimen  Workup: ECG, CBC, BMP, Troponin, BNP, CXR Findings: EKG: No STEMI and no evidence of Brugadas sign, delta wave, epsilon wave, significantly prolonged QTc, or malignant arrhythmia. BNP: 131 CXR: No evidence of acute abnormalities Based on history, exam and findings, presentation most consistent with acute on chronic heart failure. Low suspicion for PNA, ACS, tamponade, aortic dissection. Interventions: Oxygen, Diuresis  Reassessment: Symptoms improved in ED with oxygen and diuresis  Disposition (Stable): Discharge    FINAL CLINICAL IMPRESSION(S) / ED DIAGNOSES   Final diagnoses:  Bilateral lower extremity edema  Other chest pain   Rx / DC Orders   ED Discharge Orders     None      Note:  This document was prepared using Dragon voice recognition software and may include unintentional dictation errors.   Merwyn Katos, MD 01/06/23 [phone]

## 2023-01-06 NOTE — ED Notes (Signed)
Pt given a cup of water and 2 packs of graham cracker per MD Bradler.

## 2023-01-10 ENCOUNTER — Encounter: Payer: Self-pay | Admitting: *Deleted

## 2023-01-10 ENCOUNTER — Emergency Department: Payer: Medicaid Other

## 2023-01-10 ENCOUNTER — Other Ambulatory Visit: Payer: Self-pay

## 2023-01-10 ENCOUNTER — Inpatient Hospital Stay
Admission: EM | Admit: 2023-01-10 | Discharge: 2023-01-15 | DRG: 640 | Disposition: A | Discharge status: Home Health | Payer: Medicaid Other | Attending: Internal Medicine | Admitting: Internal Medicine

## 2023-01-10 DIAGNOSIS — E871 Hypo-osmolality and hyponatremia: Secondary | ICD-10-CM | POA: Diagnosis not present

## 2023-01-10 DIAGNOSIS — D631 Anemia in chronic kidney disease: Secondary | ICD-10-CM | POA: Diagnosis present

## 2023-01-10 DIAGNOSIS — Z5986 Financial insecurity: Secondary | ICD-10-CM

## 2023-01-10 DIAGNOSIS — E669 Obesity, unspecified: Secondary | ICD-10-CM | POA: Diagnosis present

## 2023-01-10 DIAGNOSIS — N179 Acute kidney failure, unspecified: Secondary | ICD-10-CM | POA: Diagnosis present

## 2023-01-10 DIAGNOSIS — Z6830 Body mass index (BMI) 30.0-30.9, adult: Secondary | ICD-10-CM

## 2023-01-10 DIAGNOSIS — E119 Type 2 diabetes mellitus without complications: Secondary | ICD-10-CM

## 2023-01-10 DIAGNOSIS — N1831 Chronic kidney disease, stage 3a: Secondary | ICD-10-CM | POA: Diagnosis present

## 2023-01-10 DIAGNOSIS — R631 Polydipsia: Secondary | ICD-10-CM | POA: Diagnosis present

## 2023-01-10 DIAGNOSIS — E785 Hyperlipidemia, unspecified: Secondary | ICD-10-CM | POA: Diagnosis present

## 2023-01-10 DIAGNOSIS — G9341 Metabolic encephalopathy: Secondary | ICD-10-CM | POA: Diagnosis present

## 2023-01-10 DIAGNOSIS — E1169 Type 2 diabetes mellitus with other specified complication: Secondary | ICD-10-CM | POA: Diagnosis present

## 2023-01-10 DIAGNOSIS — R0602 Shortness of breath: Secondary | ICD-10-CM | POA: Diagnosis not present

## 2023-01-10 DIAGNOSIS — E872 Acidosis, unspecified: Secondary | ICD-10-CM | POA: Diagnosis present

## 2023-01-10 DIAGNOSIS — R600 Localized edema: Secondary | ICD-10-CM

## 2023-01-10 DIAGNOSIS — Z79899 Other long term (current) drug therapy: Secondary | ICD-10-CM

## 2023-01-10 DIAGNOSIS — T501X5A Adverse effect of loop [high-ceiling] diuretics, initial encounter: Secondary | ICD-10-CM | POA: Diagnosis present

## 2023-01-10 DIAGNOSIS — F04 Amnestic disorder due to known physiological condition: Secondary | ICD-10-CM | POA: Diagnosis present

## 2023-01-10 DIAGNOSIS — Z882 Allergy status to sulfonamides status: Secondary | ICD-10-CM

## 2023-01-10 DIAGNOSIS — R9431 Abnormal electrocardiogram [ECG] [EKG]: Secondary | ICD-10-CM | POA: Diagnosis not present

## 2023-01-10 DIAGNOSIS — Z880 Allergy status to penicillin: Secondary | ICD-10-CM

## 2023-01-10 DIAGNOSIS — E1122 Type 2 diabetes mellitus with diabetic chronic kidney disease: Secondary | ICD-10-CM | POA: Diagnosis present

## 2023-01-10 DIAGNOSIS — I5033 Acute on chronic diastolic (congestive) heart failure: Secondary | ICD-10-CM | POA: Diagnosis present

## 2023-01-10 DIAGNOSIS — I491 Atrial premature depolarization: Secondary | ICD-10-CM | POA: Diagnosis present

## 2023-01-10 DIAGNOSIS — R4189 Other symptoms and signs involving cognitive functions and awareness: Secondary | ICD-10-CM | POA: Insufficient documentation

## 2023-01-10 HISTORY — DX: Chronic kidney disease, stage 3 unspecified: N18.30

## 2023-01-10 HISTORY — DX: Type 2 diabetes mellitus without complications: E11.9

## 2023-01-10 HISTORY — DX: Unspecified diastolic (congestive) heart failure: I50.30

## 2023-01-10 HISTORY — DX: Unspecified chronic gastritis without bleeding: K29.50

## 2023-01-10 HISTORY — DX: Anemia in chronic kidney disease: D63.1

## 2023-01-10 HISTORY — DX: Gastro-esophageal reflux disease without esophagitis: K21.9

## 2023-01-10 HISTORY — DX: Anemia in chronic kidney disease: N18.9

## 2023-01-10 HISTORY — DX: Other symptoms and signs involving cognitive functions and awareness: R41.89

## 2023-01-10 HISTORY — DX: Amnestic disorder due to known physiological condition: F04

## 2023-01-10 LAB — CBC
HCT: 32.7 % — ABNORMAL LOW (ref 36.0–46.0)
Hemoglobin: 10.8 g/dL — ABNORMAL LOW (ref 12.0–15.0)
MCH: 30.4 pg (ref 26.0–34.0)
MCHC: 33 g/dL (ref 30.0–36.0)
MCV: 92.1 fL (ref 80.0–100.0)
Platelets: 149 10*3/uL — ABNORMAL LOW (ref 150–400)
RBC: 3.55 MIL/uL — ABNORMAL LOW (ref 3.87–5.11)
RDW: 13.5 % (ref 11.5–15.5)
WBC: 8.2 10*3/uL (ref 4.0–10.5)
nRBC: 0 % (ref 0.0–0.2)

## 2023-01-10 LAB — TROPONIN I (HIGH SENSITIVITY): Troponin I (High Sensitivity): 8 ng/L (ref <18)

## 2023-01-10 LAB — BASIC METABOLIC PANEL
Anion gap: 10 (ref 5–15)
BUN: 26 mg/dL — ABNORMAL HIGH (ref 6–20)
CO2: 20 mmol/L — ABNORMAL LOW (ref 22–32)
Calcium: 8 mg/dL — ABNORMAL LOW (ref 8.9–10.3)
Chloride: 92 mmol/L — ABNORMAL LOW (ref 98–111)
Creatinine, Ser: 1.25 mg/dL — ABNORMAL HIGH (ref 0.44–1.00)
GFR, Estimated: 51 mL/min — ABNORMAL LOW (ref >60)
Glucose, Bld: 179 mg/dL — ABNORMAL HIGH (ref 70–99)
Potassium: 3.9 mmol/L (ref 3.5–5.1)
Sodium: 122 mmol/L — ABNORMAL LOW (ref 135–145)

## 2023-01-10 MED ORDER — FUROSEMIDE 10 MG/ML IJ SOLN
60.0000 mg | Freq: Once | INTRAMUSCULAR | Status: AC
  Administered 2023-01-11: 60 mg via INTRAVENOUS
  Filled 2023-01-10: qty 8

## 2023-01-10 NOTE — ED Triage Notes (Signed)
Pt to triage via wheelchair.  Pt has swelling to lower legs and sob with chest pain.  Sx for 1 week.  Pt taking fluids pills.  Pt reports feeling worse today.  Pt alert.  Speech clear.

## 2023-01-10 NOTE — Progress Notes (Deleted)
Advanced Heart Failure Clinic Note   Referring Physician: PCP: Hillery Aldo, MD PCP-Cardiologist: None   HPI:  Deanna Joseph is a 57 y/o female with a history of       Review of Systems: [y] = yes, [ ]  = no   General: Weight gain [ ] ; Weight loss [ ] ; Anorexia [ ] ; Fatigue [ ] ; Fever [ ] ; Chills [ ] ; Weakness [ ]   Cardiac: Chest pain/pressure [ ] ; Resting SOB [ ] ; Exertional SOB [ ] ; Orthopnea [ ] ; Pedal Edema [ ] ; Palpitations [ ] ; Syncope [ ] ; Presyncope [ ] ; Paroxysmal nocturnal dyspnea[ ]   Pulmonary: Cough [ ] ; Wheezing[ ] ; Hemoptysis[ ] ; Sputum [ ] ; Snoring [ ]   GI: Vomiting[ ] ; Dysphagia[ ] ; Melena[ ] ; Hematochezia [ ] ; Heartburn[ ] ; Abdominal pain [ ] ; Constipation [ ] ; Diarrhea [ ] ; BRBPR [ ]   GU: Hematuria[ ] ; Dysuria [ ] ; Nocturia[ ]   Vascular: Pain in legs with walking [ ] ; Pain in feet with lying flat [ ] ; Non-healing sores [ ] ; Stroke [ ] ; TIA [ ] ; Slurred speech [ ] ;  Neuro: Headaches[ ] ; Vertigo[ ] ; Seizures[ ] ; Paresthesias[ ] ;Blurred vision [ ] ; Diplopia [ ] ; Vision changes [ ]   Ortho/Skin: Arthritis [ ] ; Joint pain [ ] ; Muscle pain [ ] ; Joint swelling [ ] ; Back Pain [ ] ; Rash [ ]   Psych: Depression[ ] ; Anxiety[ ]   Heme: Bleeding problems [ ] ; Clotting disorders [ ] ; Anemia [ ]   Endocrine: Diabetes [ ] ; Thyroid dysfunction[ ]    Past Medical History:  Diagnosis Date   Diabetes mellitus without complication (HCC)     Current Outpatient Medications  Medication Sig Dispense Refill   acetaminophen (TYLENOL) 500 MG tablet Take 500 mg by mouth every 6 (six) hours as needed.     feeding supplement, ENSURE ENLIVE, (ENSURE ENLIVE) LIQD Take 237 mLs by mouth 3 (three) times daily between meals.     folic acid (FOLVITE) 1 MG tablet Take 1 mg by mouth daily.     gabapentin (NEURONTIN) 300 MG capsule Take 300 mg by mouth at bedtime.     omeprazole (PRILOSEC) 20 MG capsule Take 20 mg by mouth daily.     oxyCODONE (OXY IR/ROXICODONE) 5 MG immediate release tablet Take 1  tablet (5 mg total) by mouth every 6 (six) hours as needed for moderate pain or severe pain. 30 tablet 0   polyethylene glycol (MIRALAX / GLYCOLAX) 17 g packet Take 17 g by mouth 2 (two) times daily. Can decrease to once a day if having >2 BM's per day.  0   traZODone (DESYREL) 50 MG tablet Take 50 mg by mouth at bedtime.     No current facility-administered medications for this visit.    Allergies  Allergen Reactions   Penicillins     Rash    Sulfa Antibiotics Hives      Social History   Socioeconomic History   Marital status: Married    Spouse name: Not on file   Number of children: Not on file   Years of education: Not on file   Highest education level: Not on file  Occupational History   Not on file  Tobacco Use   Smoking status: Never   Smokeless tobacco: Never  Substance and Sexual Activity   Alcohol use: Not Currently   Drug use: Not on file   Sexual activity: Not on file  Other Topics Concern   Not on file  Social History Narrative   Not on file  Social Determinants of Health   Financial Resource Strain: Low Risk  (02/26/2020)   Received from [facility]   Overall Financial Resource Strain (CARDIA)    Difficulty of Paying Living Expenses: Not very hard  Recent Concern: Financial Resource Strain - Medium Risk (01/13/2020)   Received from [facility]   Overall Financial Resource Strain (CARDIA)    Difficulty of Paying Living Expenses: Somewhat hard  Food Insecurity: No Food Insecurity (09/30/2022)   Hunger Vital Sign    Worried About Running Out of Food in the Last Year: Never true    Ran Out of Food in the Last Year: Never true  Transportation Needs: No Transportation Needs (09/30/2022)   PRAPARE - Administrator, Civil Service (Medical): No    Lack of Transportation (Non-Medical): No  Physical Activity: Not on file  Stress: Not on file  Social Connections: Not on file  Intimate Partner Violence: Not At Risk (09/30/2022)    Humiliation, Afraid, Rape, and Kick questionnaire    Fear of Current or Ex-Partner: No    Emotionally Abused: No    Physically Abused: No    Sexually Abused: No      Family History  Problem Relation Age of Onset   Breast cancer Maternal Aunt 55    There were no vitals filed for this visit.   PHYSICAL EXAM: General:  Well appearing. No respiratory difficulty HEENT: normal Neck: supple. no JVD. Carotids 2+ bilat; no bruits. No lymphadenopathy or thyromegaly appreciated. Cor: PMI nondisplaced. Regular rate & rhythm. No rubs, gallops or murmurs. Lungs: clear Abdomen: soft, nontender, nondistended. No hepatosplenomegaly. No bruits or masses. Good bowel sounds. Extremities: no cyanosis, clubbing, rash, edema Neuro: alert & oriented x 3, cranial nerves grossly intact. moves all 4 extremities w/o difficulty. Affect pleasant.  ECG:   ASSESSMENT & PLAN:     Delma Freeze, FNP 01/10/23

## 2023-01-10 NOTE — ED Provider Notes (Signed)
[facility] Provider Note    Event Date/Time   First MD Initiated Contact with Patient 01/10/23 2258     (approximate)   History   Leg Swelling   HPI  Deanna Joseph is a 57 y.o. female who presents to the ED for evaluation of Leg Swelling   Reviewed cardiology clinic visit from 8/1 patient was evaluated for lower extremity edema, dyspnea.  Sent to the ED In the ED, had a CTA chest without PE.  Diuresed and discharged. At baseline, history of obesity, DM Echo in April with grade 1 diastolic dysfunction and normal EF  Patient presents with her husband for evaluation of worsening lower extremity swelling and aching pain.  Increased repetitiveness and confusion.  They report compliance with the Lasix with continued urinary output.  Occasional intermittent chest discomfort.   Physical Exam   Triage Vital Signs: ED Triage Vitals  Encounter Vitals Group     BP 01/10/23 1852 (!) 147/77     Systolic BP Percentile --      Diastolic BP Percentile --      Pulse Rate 01/10/23 1852 95     Resp 01/10/23 1852 18     Temp 01/10/23 1852 98.7 F (37.1 C)     Temp Source 01/10/23 1852 Oral     SpO2 01/10/23 1852 99 %     Weight 01/10/23 1856 189 lb (85.7 kg)     Height 01/10/23 1856 5' (1.524 m)     Head Circumference --      Peak Flow --      Pain Score 01/10/23 1856 9     Pain Loc --      Pain Education --      Exclude from Growth Chart --     Most recent vital signs: Vitals:   01/10/23 1852 01/10/23 2236  BP: (!) 147/77 133/78  Pulse: 95 90  Resp: 18 18  Temp: 98.7 F (37.1 C) 98.3 F (36.8 C)  SpO2: 99% 99%    General: Awake, no distress.  Pleasantly disoriented CV:  Good peripheral perfusion.  Resp:  Normal effort.  Abd:  No distention.  MSK:  No deformity noted.  Symmetric lower extremity edema and swelling Neuro:  No focal deficits appreciated. Other:     ED Results / Procedures / Treatments   Labs (all labs ordered are listed,  but only abnormal results are displayed) Labs Reviewed  BASIC METABOLIC PANEL - Abnormal; Notable for the following components:      Result Value   Sodium 122 (*)    Chloride 92 (*)    CO2 20 (*)    Glucose, Bld 179 (*)    BUN 26 (*)    Creatinine, Ser 1.25 (*)    Calcium 8.0 (*)    GFR, Estimated 51 (*)    All other components within normal limits  CBC - Abnormal; Notable for the following components:   RBC 3.55 (*)    Hemoglobin 10.8 (*)    HCT 32.7 (*)    Platelets 149 (*)    All other components within normal limits  BRAIN NATRIURETIC PEPTIDE  OSMOLALITY  TROPONIN I (HIGH SENSITIVITY)  TROPONIN I (HIGH SENSITIVITY)    EKG Sinus rhythm with a rate of 98 bpm.  Normal axis and intervals.  NO clear signs of acute ischemia.  RADIOLOGY CXR interpreted by me without evidence of acute cardiopulmonary pathology.  Official radiology report(s): DG Chest 2 View  Result Date: 01/10/2023  CLINICAL DATA:  Shortness of breath EXAM: CHEST - 2 VIEW COMPARISON:  Chest x-ray 01/06/2023 FINDINGS: The heart size and mediastinal contours are within normal limits. Both lungs are clear. Chronic left humeral neck fracture again noted. IMPRESSION: No active cardiopulmonary disease. Electronically Signed   By: Darliss Cheney M.D.   On: 01/10/2023 19:58    PROCEDURES and INTERVENTIONS:  .1-3 Lead EKG Interpretation  Performed by: Delton Prairie, MD Authorized by: Delton Prairie, MD     Interpretation: normal     ECG rate:  90   ECG rate assessment: normal     Rhythm: sinus rhythm     Ectopy: none     Conduction: normal     Medications  furosemide (LASIX) injection 60 mg (has no administration in time range)     IMPRESSION / MDM / ASSESSMENT AND PLAN / ED COURSE  I reviewed the triage vital signs and the nursing notes.  Differential diagnosis includes, but is not limited to, electrolyte derangement, CHF, liver failure,  {Patient presents with symptoms of an acute illness or injury that is  potentially life-threatening.  Patient presents with increasing lower extremity swelling and increased repetitive note from her baseline.  I suspect hyponatremia is contributing to her increased repetitiveness and nonfocal altered mentation increased from her baseline dementia.  With a baseline confusion, water potomania is a possibility.  We will add serum osmolalities.  Her CXR is clear and this may not all be cardiac in etiology.  CBC and troponin are reassuring.  We will add a second troponin, serum BNP.  Will obtain a CT of her head due to her nonfocal encephalopathy.  Plan for admission.      FINAL CLINICAL IMPRESSION(S) / ED DIAGNOSES   Final diagnoses:  None     Rx / DC Orders   ED Discharge Orders     None        Note:  This document was prepared using Dragon voice recognition software and may include unintentional dictation errors.   Delton Prairie, MD 01/10/23 [id number]

## 2023-01-11 ENCOUNTER — Inpatient Hospital Stay
Admit: 2023-01-11 | Discharge: 2023-01-11 | Disposition: A | Discharge status: Home / Self Care | Payer: Medicaid Other | Attending: Cardiology | Admitting: Cardiology

## 2023-01-11 ENCOUNTER — Encounter: Payer: Medicaid Other | Admitting: Family

## 2023-01-11 DIAGNOSIS — T501X5A Adverse effect of loop [high-ceiling] diuretics, initial encounter: Secondary | ICD-10-CM | POA: Diagnosis not present

## 2023-01-11 DIAGNOSIS — Z5986 Financial insecurity: Secondary | ICD-10-CM | POA: Diagnosis not present

## 2023-01-11 DIAGNOSIS — I491 Atrial premature depolarization: Secondary | ICD-10-CM | POA: Diagnosis not present

## 2023-01-11 DIAGNOSIS — E669 Obesity, unspecified: Secondary | ICD-10-CM | POA: Diagnosis not present

## 2023-01-11 DIAGNOSIS — I5033 Acute on chronic diastolic (congestive) heart failure: Secondary | ICD-10-CM | POA: Diagnosis not present

## 2023-01-11 DIAGNOSIS — Z6830 Body mass index (BMI) 30.0-30.9, adult: Secondary | ICD-10-CM | POA: Diagnosis not present

## 2023-01-11 DIAGNOSIS — R9431 Abnormal electrocardiogram [ECG] [EKG]: Secondary | ICD-10-CM | POA: Diagnosis not present

## 2023-01-11 DIAGNOSIS — E871 Hypo-osmolality and hyponatremia: Secondary | ICD-10-CM | POA: Diagnosis not present

## 2023-01-11 DIAGNOSIS — I5031 Acute diastolic (congestive) heart failure: Secondary | ICD-10-CM | POA: Diagnosis not present

## 2023-01-11 DIAGNOSIS — Z79899 Other long term (current) drug therapy: Secondary | ICD-10-CM | POA: Diagnosis not present

## 2023-01-11 DIAGNOSIS — R0602 Shortness of breath: Secondary | ICD-10-CM | POA: Diagnosis not present

## 2023-01-11 DIAGNOSIS — F04 Amnestic disorder due to known physiological condition: Secondary | ICD-10-CM | POA: Diagnosis not present

## 2023-01-11 DIAGNOSIS — E1122 Type 2 diabetes mellitus with diabetic chronic kidney disease: Secondary | ICD-10-CM | POA: Diagnosis not present

## 2023-01-11 DIAGNOSIS — G9341 Metabolic encephalopathy: Secondary | ICD-10-CM | POA: Diagnosis not present

## 2023-01-11 DIAGNOSIS — Z880 Allergy status to penicillin: Secondary | ICD-10-CM | POA: Diagnosis not present

## 2023-01-11 DIAGNOSIS — Z882 Allergy status to sulfonamides status: Secondary | ICD-10-CM | POA: Diagnosis not present

## 2023-01-11 DIAGNOSIS — D631 Anemia in chronic kidney disease: Secondary | ICD-10-CM | POA: Diagnosis not present

## 2023-01-11 DIAGNOSIS — N179 Acute kidney failure, unspecified: Secondary | ICD-10-CM | POA: Diagnosis not present

## 2023-01-11 DIAGNOSIS — N1831 Chronic kidney disease, stage 3a: Secondary | ICD-10-CM | POA: Diagnosis not present

## 2023-01-11 DIAGNOSIS — R600 Localized edema: Secondary | ICD-10-CM | POA: Insufficient documentation

## 2023-01-11 DIAGNOSIS — R4189 Other symptoms and signs involving cognitive functions and awareness: Secondary | ICD-10-CM | POA: Insufficient documentation

## 2023-01-11 DIAGNOSIS — E1169 Type 2 diabetes mellitus with other specified complication: Secondary | ICD-10-CM | POA: Diagnosis not present

## 2023-01-11 DIAGNOSIS — N183 Chronic kidney disease, stage 3 unspecified: Secondary | ICD-10-CM | POA: Diagnosis not present

## 2023-01-11 DIAGNOSIS — R631 Polydipsia: Secondary | ICD-10-CM | POA: Diagnosis not present

## 2023-01-11 DIAGNOSIS — E872 Acidosis, unspecified: Secondary | ICD-10-CM | POA: Diagnosis not present

## 2023-01-11 DIAGNOSIS — E785 Hyperlipidemia, unspecified: Secondary | ICD-10-CM | POA: Diagnosis not present

## 2023-01-11 LAB — SODIUM
Sodium: 126 mmol/L — ABNORMAL LOW (ref 135–145)
Sodium: 128 mmol/L — ABNORMAL LOW (ref 135–145)
Sodium: 128 mmol/L — ABNORMAL LOW (ref 135–145)
Sodium: 129 mmol/L — ABNORMAL LOW (ref 135–145)
Sodium: 129 mmol/L — ABNORMAL LOW (ref 135–145)
Sodium: 129 mmol/L — ABNORMAL LOW (ref 135–145)

## 2023-01-11 LAB — CBG MONITORING, ED
Glucose-Capillary: 133 mg/dL — ABNORMAL HIGH (ref 70–99)
Glucose-Capillary: 134 mg/dL — ABNORMAL HIGH (ref 70–99)

## 2023-01-11 LAB — GLUCOSE, CAPILLARY
Glucose-Capillary: 137 mg/dL — ABNORMAL HIGH (ref 70–99)
Glucose-Capillary: 127 mg/dL — ABNORMAL HIGH (ref 70–99)
Glucose-Capillary: 157 mg/dL — ABNORMAL HIGH (ref 70–99)
Glucose-Capillary: 159 mg/dL — ABNORMAL HIGH (ref 70–99)

## 2023-01-11 LAB — SODIUM, URINE, RANDOM: Sodium, Ur: 47 mmol/L

## 2023-01-11 LAB — CREATININE, SERUM
Creatinine, Ser: 1.35 mg/dL — ABNORMAL HIGH (ref 0.44–1.00)
GFR, Estimated: 46 mL/min — ABNORMAL LOW (ref >60)

## 2023-01-11 LAB — CBC
HCT: 30.5 % — ABNORMAL LOW (ref 36.0–46.0)
Hemoglobin: 10.3 g/dL — ABNORMAL LOW (ref 12.0–15.0)
MCH: 30.7 pg (ref 26.0–34.0)
MCHC: 33.8 g/dL (ref 30.0–36.0)
MCV: 90.8 fL (ref 80.0–100.0)
Platelets: 271 10*3/uL (ref 150–400)
RBC: 3.36 MIL/uL — ABNORMAL LOW (ref 3.87–5.11)
RDW: 13.6 % (ref 11.5–15.5)
WBC: 7.6 10*3/uL (ref 4.0–10.5)
nRBC: 0 % (ref 0.0–0.2)

## 2023-01-11 LAB — ECHOCARDIOGRAM LIMITED
Height: 60 in
Weight: 3024 oz

## 2023-01-11 LAB — OSMOLALITY, URINE: Osmolality, Ur: 160 mOsm/kg — ABNORMAL LOW (ref 300–900)

## 2023-01-11 LAB — POTASSIUM: Potassium: 4.2 mmol/L (ref 3.5–5.1)

## 2023-01-11 LAB — MAGNESIUM: Magnesium: 2 mg/dL (ref 1.7–2.4)

## 2023-01-11 MED ORDER — OXYCODONE HCL 5 MG PO TABS
5.0000 mg | ORAL_TABLET | Freq: Four times a day (QID) | ORAL | Status: DC | PRN
  Administered 2023-01-11 – 2023-01-14 (×4): 5 mg via ORAL
  Filled 2023-01-11 (×4): qty 1

## 2023-01-11 MED ORDER — TRAZODONE HCL 50 MG PO TABS
50.0000 mg | ORAL_TABLET | Freq: Every day | ORAL | Status: DC
  Filled 2023-01-11: qty 1

## 2023-01-11 MED ORDER — ONDANSETRON HCL 4 MG PO TABS
4.0000 mg | ORAL_TABLET | Freq: Four times a day (QID) | ORAL | Status: DC | PRN
  Administered 2023-01-12: 4 mg via ORAL
  Filled 2023-01-11: qty 1

## 2023-01-11 MED ORDER — ENOXAPARIN SODIUM 40 MG/0.4ML IJ SOSY
40.0000 mg | PREFILLED_SYRINGE | INTRAMUSCULAR | Status: DC
  Administered 2023-01-11: 40 mg via SUBCUTANEOUS
  Filled 2023-01-11: qty 0.4

## 2023-01-11 MED ORDER — INSULIN ASPART 100 UNIT/ML IJ SOLN
0.0000 [IU] | Freq: Three times a day (TID) | INTRAMUSCULAR | Status: DC
  Administered 2023-01-11 (×3): 2 [IU] via SUBCUTANEOUS
  Administered 2023-01-12 – 2023-01-13 (×4): 3 [IU] via SUBCUTANEOUS
  Administered 2023-01-13 – 2023-01-14 (×3): 2 [IU] via SUBCUTANEOUS
  Administered 2023-01-14: 5 [IU] via SUBCUTANEOUS
  Administered 2023-01-15 (×2): 3 [IU] via SUBCUTANEOUS
  Filled 2023-01-11 (×11): qty 1

## 2023-01-11 MED ORDER — ENSURE ENLIVE PO LIQD
237.0000 mL | Freq: Two times a day (BID) | ORAL | Status: DC
  Administered 2023-01-11 – 2023-01-12 (×2): 237 mL via ORAL

## 2023-01-11 MED ORDER — INSULIN ASPART 100 UNIT/ML IJ SOLN
0.0000 [IU] | Freq: Every day | INTRAMUSCULAR | Status: DC
  Filled 2023-01-11 (×2): qty 1

## 2023-01-11 MED ORDER — ACETAMINOPHEN 325 MG PO TABS
650.0000 mg | ORAL_TABLET | Freq: Four times a day (QID) | ORAL | Status: DC | PRN
  Administered 2023-01-13 – 2023-01-15 (×3): 650 mg via ORAL
  Filled 2023-01-11 (×3): qty 2

## 2023-01-11 MED ORDER — HALOPERIDOL 1 MG PO TABS
1.0000 mg | ORAL_TABLET | Freq: Three times a day (TID) | ORAL | Status: DC | PRN
  Administered 2023-01-12: 1 mg via ORAL
  Filled 2023-01-11 (×4): qty 1

## 2023-01-11 MED ORDER — ACETAMINOPHEN 650 MG RE SUPP: 650.0000 mg | Freq: Four times a day (QID) | RECTAL | Status: DC | PRN

## 2023-01-11 MED ORDER — ENOXAPARIN SODIUM 40 MG/0.4ML IJ SOSY: 40.0000 mg | PREFILLED_SYRINGE | INTRAMUSCULAR | Status: DC

## 2023-01-11 MED ORDER — LORAZEPAM 0.5 MG PO TABS
0.5000 mg | ORAL_TABLET | Freq: Once | ORAL | Status: AC
  Administered 2023-01-11: 0.5 mg via ORAL
  Filled 2023-01-11: qty 1

## 2023-01-11 MED ORDER — FUROSEMIDE 10 MG/ML IJ SOLN
40.0000 mg | Freq: Two times a day (BID) | INTRAMUSCULAR | Status: DC
  Administered 2023-01-11 – 2023-01-12 (×3): 40 mg via INTRAVENOUS
  Filled 2023-01-11 (×3): qty 4

## 2023-01-11 MED ORDER — LORAZEPAM 0.5 MG PO TABS
0.5000 mg | ORAL_TABLET | Freq: Once | ORAL | Status: AC | PRN
  Administered 2023-01-11: 0.5 mg via ORAL
  Filled 2023-01-11: qty 1

## 2023-01-11 MED ORDER — METOPROLOL SUCCINATE ER 25 MG PO TB24
12.5000 mg | ORAL_TABLET | Freq: Every day | ORAL | Status: DC
  Administered 2023-01-11 – 2023-01-15 (×5): 12.5 mg via ORAL
  Filled 2023-01-11 (×5): qty 1

## 2023-01-11 MED ORDER — POTASSIUM CHLORIDE CRYS ER 20 MEQ PO TBCR
40.0000 meq | EXTENDED_RELEASE_TABLET | Freq: Once | ORAL | Status: AC
  Administered 2023-01-11: 40 meq via ORAL
  Filled 2023-01-11: qty 2

## 2023-01-11 MED ORDER — ONDANSETRON HCL 4 MG/2ML IJ SOLN: 4.0000 mg | Freq: Four times a day (QID) | INTRAMUSCULAR | Status: DC | PRN

## 2023-01-11 NOTE — Assessment & Plan Note (Deleted)
Grade 1 diastolic dysfunction (EF 60 to 65% 09/2022) Patient was seen by cardiology on 8/1

## 2023-01-11 NOTE — Consult Note (Signed)
[facility]  CONSULT NOTE    Date: 01/11/2023                  Patient Name:  Deanna Joseph  MRN: [id number]  DOB: 27-Nov-1965  Age / Sex: 57 y.o., female         PCP: Hillery Aldo, MD                 Service Requesting Consult: TRH                 Reason for Consult: Hyponatremia            History of Present Illness: Ms. Deanna Joseph is a 57 y.o.  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 Patient is seen resting on stretcher in ED, husband at bedside. Husband states patient began having lower extremity edema a week ago. Has maintained all diuretics. Patient's husband states she eats every two hours. Denies nausea, vomiting, or diarrhea. Reports generalized pain.   Labs on ED arrival significant for sodium 122, serum bicarb 20, BUN 26, creatinine 1.25 with GFR 51, and hemoglobin 10.8.  Chest x-ray negative for acute findings.  CT head negative.  Decrease sodium treated with IV furosemide, corrected to 128 this morning.  Medications: Outpatient medications: (Not in a hospital admission)   Current medications: Current Facility-Administered Medications  Medication Dose Route Frequency Provider Last Rate Last Admin   acetaminophen (TYLENOL) tablet 650 mg  650 mg Oral Q6H PRN Andris Baumann, MD       Or   acetaminophen (TYLENOL) suppository 650 mg  650 mg Rectal Q6H PRN Andris Baumann, MD       feeding supplement (ENSURE ENLIVE / ENSURE PLUS) liquid 237 mL  237 mL Oral BID BM , , NP   237 mL at 01/11/23 1326   furosemide (LASIX) injection 40 mg  40 mg Intravenous BID Rebeca Allegra, PA-C   40 mg at 01/11/23  1020   insulin aspart (novoLOG) injection 0-15 Units  0-15 Units Subcutaneous TID WC Lindajo Royal V, MD   2 Units at 01/11/23 1326   insulin aspart (novoLOG) injection 0-5 Units  0-5 Units Subcutaneous QHS Andris Baumann, MD       metoprolol succinate (TOPROL-XL) 24 hr tablet 12.5 mg  12.5 mg Oral Daily Rebeca Allegra, PA-C   12.5 mg at 01/11/23 1331   ondansetron (ZOFRAN) tablet 4 mg  4 mg Oral Q6H PRN Andris Baumann, MD       Or   ondansetron [facility]) injection 4 mg  4 mg Intravenous Q6H PRN Andris Baumann, MD       oxyCODONE (Oxy IR/ROXICODONE) immediate release tablet 5 mg  5 mg Oral Q6H PRN Andris Baumann, MD   5 mg at 01/11/23 2956   traZODone (DESYREL) tablet 50 mg  50 mg Oral QHS Andris Baumann, MD       Current Outpatient Medications  Medication Sig Dispense Refill   acetaminophen (TYLENOL) 500 MG tablet Take 500 mg by mouth every 6 (six) hours as needed.     furosemide (LASIX) 20 MG tablet Take 20 mg by mouth daily.  omeprazole (PRILOSEC) 40 MG capsule Take 40 mg by mouth daily.     traMADol (ULTRAM) 50 MG tablet Take 50 mg by mouth every 6 (six) hours as needed for moderate pain.     traZODone (DESYREL) 50 MG tablet Take 50 mg by mouth at bedtime.     feeding supplement, ENSURE ENLIVE, (ENSURE ENLIVE) LIQD Take 237 mLs by mouth 3 (three) times daily between meals.     oxyCODONE (OXY IR/ROXICODONE) 5 MG immediate release tablet Take 1 tablet (5 mg total) by mouth every 6 (six) hours as needed for moderate pain or severe pain. (Patient not taking: Reported on 01/11/2023) 30 tablet 0   polyethylene glycol (MIRALAX / GLYCOLAX) 17 g packet Take 17 g by mouth 2 (two) times daily. Can decrease to once a day if having >2 BM's per day. (Patient not taking: Reported on 01/11/2023)  0      Allergies: Allergies  Allergen Reactions   Sulfa Antibiotics Hives   Penicillins Nausea And Vomiting    Rash  Other Reaction(s): Dizziness  Tolerates cephalosporins      Past Medical  History: Past Medical History:  Diagnosis Date   Diabetes mellitus without complication (HCC)      Past Surgical History: Past Surgical History:  Procedure Laterality Date   CESAREAN SECTION     CHOLECYSTECTOMY     DILATION AND CURETTAGE OF UTERUS     ESOPHAGOGASTRODUODENOSCOPY N/A 07/18/2019   Procedure: ESOPHAGOGASTRODUODENOSCOPY (EGD);  Surgeon: Toledo, Boykin Nearing, MD;  Location: [facility] ENDOSCOPY;  Service: Gastroenterology;  Laterality: N/A;     Family History: Family History  Problem Relation Age of Onset   Breast cancer Maternal Aunt 84     Social History: Social History   Socioeconomic History   Marital status: Married    Spouse name: Not on file   Number of children: Not on file   Years of education: Not on file   Highest education level: Not on file  Occupational History   Not on file  Tobacco Use   Smoking status: Never   Smokeless tobacco: Never  Substance and Sexual Activity   Alcohol use: Not Currently   Drug use: Not on file   Sexual activity: Not on file  Other Topics Concern   Not on file  Social History Narrative   Not on file   Social Determinants of Health   Financial Resource Strain: Low Risk  (02/26/2020)   Received from [facility]   Overall Financial Resource Strain (CARDIA)    Difficulty of Paying Living Expenses: Not very hard  Recent Concern: Financial Resource Strain - Medium Risk (01/13/2020)   Received from [facility]   Overall Financial Resource Strain (CARDIA)    Difficulty of Paying Living Expenses: Somewhat hard  Food Insecurity: No Food Insecurity (09/30/2022)   Hunger Vital Sign    Worried About Running Out of Food in the Last Year: Never true    Ran Out of Food in the Last Year: Never true  Transportation Needs: No Transportation Needs (09/30/2022)   PRAPARE - Administrator, Civil Service (Medical): No    Lack of Transportation (Non-Medical): No  Physical Activity: Not on file  Stress:  Not on file  Social Connections: Not on file  Intimate Partner Violence: Not At Risk (09/30/2022)   Humiliation, Afraid, Rape, and Kick questionnaire    Fear of Current or Ex-Partner: No    Emotionally Abused: No    Physically Abused:  No    Sexually Abused: No     Review of Systems: Review of Systems  Constitutional:  Negative for chills, fever and malaise/fatigue.  HENT:  Negative for congestion, sore throat and tinnitus.   Eyes:  Negative for blurred vision and redness.  Respiratory:  Positive for shortness of breath. Negative for cough and wheezing.   Cardiovascular:  Positive for leg swelling. Negative for chest pain, palpitations and claudication.  Gastrointestinal:  Negative for abdominal pain, blood in stool, diarrhea, nausea and vomiting.  Genitourinary:  Negative for flank pain, frequency and hematuria.  Musculoskeletal:  Negative for back pain, falls and myalgias.  Skin:  Negative for rash.  Neurological:  Negative for dizziness, weakness and headaches.  Endo/Heme/Allergies:  Does not bruise/bleed easily.  Psychiatric/Behavioral:  Negative for depression. The patient is not nervous/anxious and does not have insomnia.     Vital Signs: Blood pressure (!) 142/79, pulse 96, temperature 98.1 F (36.7 C), temperature source Oral, resp. rate 16, height 5' (1.524 m), weight 85.7 kg, SpO2 100%.  Weight trends: Filed Weights   01/10/23 1856  Weight: 85.7 kg    Physical Exam: General: NAD  Head: Normocephalic, atraumatic. Moist oral mucosal membranes  Eyes: Anicteric  Lungs:  Clear to auscultation, normal effort  Heart: Regular rate and rhythm  Abdomen:  Soft, nontender,   Extremities: 1-2+ generalized peripheral edema.  Neurologic: Awake, alert, moving all four extremities  Skin: No lesions  Access: None     Lab results: Basic Metabolic Panel: Recent Labs  Lab 01/06/23 0058 01/06/23 1027 01/10/23 1858 01/11/23 0227 01/11/23 0435 01/11/23 0816 01/11/23 1211   NA 132* 131* 122* 126* 128* 128* 129*  K 4.1 4.5 3.9  --   --   --  4.2  CL 100 98 92*  --   --   --   --   CO2 22 23 20*  --   --   --   --   GLUCOSE 200* 198* 179*  --   --   --   --   BUN 20 23* 26*  --   --   --   --   CREATININE 1.09* 1.11* 1.25* 1.35*  --   --   --   CALCIUM 8.3* 8.1* 8.0*  --   --   --   --   MG  --   --   --   --   --   --  2.0    Liver Function Tests: Recent Labs  Lab 01/06/23 0058  AST 22  ALT 16  ALKPHOS 72  BILITOT 0.4  PROT 6.8  ALBUMIN 3.5   No results for input(s): "LIPASE", "AMYLASE" in the last 168 hours. No results for input(s): "AMMONIA" in the last 168 hours.  CBC: Recent Labs  Lab 01/06/23 0058 01/06/23 1027 01/10/23 1858 01/11/23 0227  WBC 10.6* 8.5 8.2 7.6  NEUTROABS  --  6.5  --   --   HGB 10.9* 10.1* 10.8* 10.3*  HCT 32.3* 29.8* 32.7* 30.5*  MCV 92.6 90.9 92.1 90.8  PLT 307 268 149* 271    Cardiac Enzymes: No results for input(s): "CKTOTAL", "CKMB", "CKMBINDEX", "TROPONINI" in the last 168 hours.  BNP: Invalid input(s): "POCBNP"  CBG: Recent Labs  Lab 01/06/23 1102 01/11/23 0950 01/11/23 1315  GLUCAP 184* 133* 134*    Microbiology: Results for orders placed or performed during the hospital encounter of 09/30/22  Urine Culture     Status: Abnormal   Collection  Time: 09/30/22  6:46 AM   Specimen: Urine, Clean Catch  Result Value Ref Range Status   Specimen Description   Final    URINE, CLEAN CATCH Performed at [facility], [address] [place], [place] [address]    Special Requests   Final    NONE Performed at [facility], [address] [place], [place] [address]    Culture >=100,000 COLONIES/mL KLEBSIELLA PNEUMONIAE (A)  Final   Report Status 10/02/2022 FINAL  Final   Organism ID, Bacteria KLEBSIELLA PNEUMONIAE (A)  Final      Susceptibility   Klebsiella pneumoniae - MIC*    AMPICILLIN >=32 RESISTANT Resistant     CEFAZOLIN <=4 SENSITIVE Sensitive     CEFEPIME <=0.12  SENSITIVE Sensitive     CEFTRIAXONE <=0.25 SENSITIVE Sensitive     CIPROFLOXACIN <=0.25 SENSITIVE Sensitive     GENTAMICIN <=1 SENSITIVE Sensitive     IMIPENEM <=0.25 SENSITIVE Sensitive     NITROFURANTOIN 64 INTERMEDIATE Intermediate     TRIMETH/SULFA <=20 SENSITIVE Sensitive     AMPICILLIN/SULBACTAM 8 SENSITIVE Sensitive     PIP/TAZO <=4 SENSITIVE Sensitive     * >=100,000 COLONIES/mL KLEBSIELLA PNEUMONIAE    Coagulation Studies: No results for input(s): "LABPROT", "INR" in the last 72 hours.  Urinalysis: No results for input(s): "COLORURINE", "LABSPEC", "PHURINE", "GLUCOSEU", "HGBUR", "BILIRUBINUR", "KETONESUR", "PROTEINUR", "UROBILINOGEN", "NITRITE", "LEUKOCYTESUR" in the last 72 hours.  Invalid input(s): "APPERANCEUR"    Imaging: CT HEAD WO CONTRAST ( )  Result Date: 01/11/2023 CLINICAL DATA:  Altered mental status EXAM: CT HEAD WITHOUT CONTRAST TECHNIQUE: Contiguous axial images were obtained from the base of the skull through the vertex without intravenous contrast. RADIATION DOSE REDUCTION: This exam was performed according to the departmental dose-optimization program which includes automated exposure control, adjustment of the mA and/or kV according to patient size and/or use of iterative reconstruction technique. COMPARISON:  10/24/2022 FINDINGS: Brain: No acute intracranial abnormality. Specifically, no hemorrhage, hydrocephalus, mass lesion, acute infarction, or significant intracranial injury. Vascular: No hyperdense vessel or unexpected calcification. Skull: No acute calvarial abnormality. Sinuses/Orbits: No acute findings Other: None IMPRESSION: No acute intracranial abnormality. Electronically Signed   By: Charlett Nose M.D.   On: 01/11/2023 00:04   DG Chest 2 View  Result Date: 01/10/2023 CLINICAL DATA:  Shortness of breath EXAM: CHEST - 2 VIEW COMPARISON:  Chest x-ray 01/06/2023 FINDINGS: The heart size and mediastinal contours are within normal limits. Both lungs are  clear. Chronic left humeral neck fracture again noted. IMPRESSION: No active cardiopulmonary disease. Electronically Signed   By: Darliss Cheney M.D.   On: 01/10/2023 19:58     Assessment & Plan: Ms. Deanna Joseph is a 58 y.o.  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  Sodium on admission 122.  Briefly considered 3% hypertonic saline.  Patient given IV furosemide with desired response.  Sodium corrected to 128 this morning.  Recent check currently 129.  Agree with IV furosemide 40 mg twice daily.  Spouse and patient encouraged to utilize Ensure as between meal snacks.  Will consider increase diuretic regimen at discharge until patient follows up with PCP.  2.  Chronic kidney disease stage IIIa.  Patient remains at baseline.  Not currently  followed by nephrology.  3. Anemia of chronic kidney disease  Lab Results  Component Value Date   HGB 10.3 (L) 01/11/2023    Hemoglobin within optimal range.  No need for ESA's at this time.  4. 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 ost recent hemoglobin A1c is 7.9 on 09/30/22.    LOS: 0   8/6/20241:48 PM

## 2023-01-11 NOTE — Consult Note (Signed)
[facility] CARDIOLOGY CONSULT NOTE       Patient ID: Deanna Joseph MRN: [id number] DOB/AGE: [birth year] 57 y.o.  Admit date: 01/10/2023 Referring Physician Dr. Rosezetta Schlatter  Primary Physician Dr. Hillery Aldo Primary Cardiologist Marijo Conception, NP  Reason for Consultation CHF  HPI: Deanna Joseph is a 57yoF with a PMH of cognitive impairment, HFpEF (60-65%, g1DD 09/2022), CKD3, DM2 who presented to [facility] ED 01/10/2023 with worsening lower extremity edema over the past 2 weeks and increased confusion.  Cardiology is consulted for assistance with the patient's heart failure.  The patient presents with her husband who is the primary historian as the patient has short-term memory loss. She has been seen at [facility] ED recently on 8/1 for similar complaints of lower extremity swelling, chest discomfort, and dyspnea.  She was given a dose of IV Lasix with improvement, and prescribed oral Lasix which she has been compliant with.  She presents today with continued lower extremity swelling, increased hunger and thirst and making repetitive statements with increased confusion per the patient's husband.  She also admits to chest pain "whenever she moves."  On further questioning, she describes this as her "heart pounding" whenever she walks from her bed to her bathroom but denies chest pressure, tightness, or sharp discomfort that radiates anywhere.  Not associated with nausea or vomiting.  She says her husband will take her pulse when she is experiencing his heart pounding and he feels like her heart is beating quickly.  This sensation will resolve when she sits down to rest.  The patient has been consuming at least 4 x 16 ounce bottles of water with multiple bottles of soda as well over the past week.  Also admits to orthopnea and PND, feels most comfortable when she is sitting more upright.  She was given a dose of IV Lasix 60 mg and was admitted for further workup.  Recent vitals are notable for a blood  pressure of 90/78, heart rate in the 90s and sinus rhythm on telemetry.  She is comfortable and saturating well on room air.  Labs are notable for initial hyponatremia to 122, which has improved over the past 12 hours after initial IV Lasix to 129 on most recent check.  Creatinine 1.35 and GFR 46.  BNP borderline elevated at 198.7.  High-sensitivity troponin negative at 9.  H&H around recent baseline at 10.3/30.5 platelets 271.  Chest x-ray with clear lungs, no pleural effusion or vascular congestion.  EKG shows sinus rhythm with some premature atrial contractions, rate 98.  Review of systems complete and found to be negative unless listed above     Past Medical History:  Diagnosis Date   Diabetes mellitus without complication (HCC)     Past Surgical History:  Procedure Laterality Date   CESAREAN SECTION     CHOLECYSTECTOMY     DILATION AND CURETTAGE OF UTERUS     ESOPHAGOGASTRODUODENOSCOPY N/A 07/18/2019   Procedure: ESOPHAGOGASTRODUODENOSCOPY (EGD);  Surgeon: Toledo, Boykin Nearing, MD;  Location: [facility] ENDOSCOPY;  Service: Gastroenterology;  Laterality: N/A;    (Not in a hospital admission)  Social History   Socioeconomic History   Marital status: Married    Spouse name: Not on file   Number of children: Not on file   Years of education: Not on file   Highest education level: Not on file  Occupational History   Not on file  Tobacco Use   Smoking status: Never   Smokeless tobacco: Never  Substance and Sexual Activity  Alcohol use: Not Currently   Drug use: Not on file   Sexual activity: Not on file  Other Topics Concern   Not on file  Social History Narrative   Not on file   Social Determinants of Health   Financial Resource Strain: Low Risk  (02/26/2020)   Received from [facility], [facility]   Overall Financial Resource Strain (CARDIA)    Difficulty of Paying Living Expenses: Not very hard  Recent Concern: Financial Resource Strain - Medium Risk (01/13/2020)    Received from [facility]   Overall Financial Resource Strain (CARDIA)    Difficulty of Paying Living Expenses: Somewhat hard  Food Insecurity: No Food Insecurity (09/30/2022)   Hunger Vital Sign    Worried About Running Out of Food in the Last Year: Never true    Ran Out of Food in the Last Year: Never true  Transportation Needs: No Transportation Needs (09/30/2022)   PRAPARE - Administrator, Civil Service (Medical): No    Lack of Transportation (Non-Medical): No  Physical Activity: Not on file  Stress: Not on file  Social Connections: Not on file  Intimate Partner Violence: Not At Risk (09/30/2022)   Humiliation, Afraid, Rape, and Kick questionnaire    Fear of Current or Ex-Partner: No    Emotionally Abused: No    Physically Abused: No    Sexually Abused: No    Family History  Problem Relation Age of Onset   Breast cancer Maternal Aunt 55      Intake/Output Summary (Last 24 hours) at 01/11/2023 1317 Last data filed at 01/11/2023 1020 Gross per 24 hour  Intake --  Output 3050 ml  Net -3050 ml    Vitals:   01/11/23 1100 01/11/23 1130 01/11/23 1200 01/11/23 1208  BP: (!) 123/55 (!) 153/63 (!) 142/79   Pulse: 91 (!) 104 96   Resp: 16 (!) 24 16   Temp:    98.1 F (36.7 C)  TempSrc:    Oral  SpO2: 99% 100% 100%   Weight:      Height:        PHYSICAL EXAM General: middle aged female , well nourished, in no acute distress. Laying at low incline in ED stretcher, husband at bedside HEENT:  Normocephalic and atraumatic. Neck:  No JVD.  Lungs: Normal respiratory effort on room air. Clear bilaterally to auscultation. No wheezes, crackles, rhonchi.  Heart: HRRR . Normal S1 and S2 without gallops or murmurs.  Abdomen: Non-distended appearing with excess adiposity.  Msk: Normal strength and tone for age. Extremities: Warm and well perfused. No clubbing, cyanosis. + puffy pedal edema, generalized LE edema up to knees.  Neuro: awake and alert Psych:  Answers simple  questions appropriately, but is a limited historian.   Labs: Basic Metabolic Panel: Recent Labs    01/10/23 1858 01/11/23 0227 01/11/23 0435 01/11/23 0816 01/11/23 1211  NA 122* 126*   < > 128* 129*  K 3.9  --   --   --  4.2  CL 92*  --   --   --   --   CO2 20*  --   --   --   --   GLUCOSE 179*  --   --   --   --   BUN 26*  --   --   --   --   CREATININE 1.25* 1.35*  --   --   --   CALCIUM 8.0*  --   --   --   --  MG  --   --   --   --  2.0   < > = values in this interval not displayed.   Liver Function Tests: No results for input(s): "AST", "ALT", "ALKPHOS", "BILITOT", "PROT", "ALBUMIN" in the last 72 hours. No results for input(s): "LIPASE", "AMYLASE" in the last 72 hours. CBC: Recent Labs    01/10/23 1858 01/11/23 0227  WBC 8.2 7.6  HGB 10.8* 10.3*  HCT 32.7* 30.5*  MCV 92.1 90.8  PLT 149* 271   Cardiac Enzymes: Recent Labs    01/10/23 1858 01/10/23 2320  TROPONINIHS 8 9   BNP: Recent Labs    01/10/23 2355  BNP 198.7*   D-Dimer: No results for input(s): "DDIMER" in the last 72 hours. Hemoglobin A1C: No results for input(s): "HGBA1C" in the last 72 hours. Fasting Lipid Panel: No results for input(s): "CHOL", "HDL", "LDLCALC", "TRIG", "CHOLHDL", "LDLDIRECT" in the last 72 hours. Thyroid Function Tests: No results for input(s): "TSH", "T4TOTAL", "T3FREE", "THYROIDAB" in the last 72 hours.  Invalid input(s): "FREET3" Anemia Panel: No results for input(s): "VITAMINB12", "FOLATE", "FERRITIN", "TIBC", "IRON", "RETICCTPCT" in the last 72 hours.   Radiology: CT HEAD WO CONTRAST ( )  Result Date: 01/11/2023 CLINICAL DATA:  Altered mental status EXAM: CT HEAD WITHOUT CONTRAST TECHNIQUE: Contiguous axial images were obtained from the base of the skull through the vertex without intravenous contrast. RADIATION DOSE REDUCTION: This exam was performed according to the departmental dose-optimization program which includes automated exposure control, adjustment of  the mA and/or kV according to patient size and/or use of iterative reconstruction technique. COMPARISON:  10/24/2022 FINDINGS: Brain: No acute intracranial abnormality. Specifically, no hemorrhage, hydrocephalus, mass lesion, acute infarction, or significant intracranial injury. Vascular: No hyperdense vessel or unexpected calcification. Skull: No acute calvarial abnormality. Sinuses/Orbits: No acute findings Other: None IMPRESSION: No acute intracranial abnormality. Electronically Signed   By: Charlett Nose M.D.   On: 01/11/2023 00:04   DG Chest 2 View  Result Date: 01/10/2023 CLINICAL DATA:  Shortness of breath EXAM: CHEST - 2 VIEW COMPARISON:  Chest x-ray 01/06/2023 FINDINGS: The heart size and mediastinal contours are within normal limits. Both lungs are clear. Chronic left humeral neck fracture again noted. IMPRESSION: No active cardiopulmonary disease. Electronically Signed   By: Darliss Cheney M.D.   On: 01/10/2023 19:58   CT Angio Chest PE W/Cm &/Or Wo Cm  Result Date: 01/06/2023 CLINICAL DATA:  Severe chest pain. New onset CHF. Evaluate for pulmonary embolism. EXAM: CT ANGIOGRAPHY CHEST WITH CONTRAST TECHNIQUE: Multidetector CT imaging of the chest was performed using the standard protocol during bolus administration of intravenous contrast. Multiplanar CT image reconstructions and MIPs were obtained to evaluate the vascular anatomy. RADIATION DOSE REDUCTION: This exam was performed according to the departmental dose-optimization program which includes automated exposure control, adjustment of the mA and/or kV according to patient size and/or use of iterative reconstruction technique. CONTRAST:  75mL OMNIPAQUE IOHEXOL 350 MG/ML SOLN COMPARISON:  Chest radiograph-01/06/2023 FINDINGS: Vascular Findings: There is adequate opacification of the pulmonary arterial system with the main pulmonary artery measuring 433 Hounsfield units. There are no discrete filling defects within the pulmonary arterial tree to  suggest pulmonary embolism. Normal caliber of the main pulmonary artery. Normal heart size. No pericardial effusion. Coronary artery calcifications. No evidence of thoracic aortic aneurysm or dissection on this nongated examination. Bovine configuration of the aortic arch. The branch vessels of the aortic arch appear widely patent throughout their imaged courses. Review of the MIP images confirms the  above findings. ---------------------------------------------------------------------------------- Nonvascular Findings: Mediastinum/Lymph Nodes: Scattered mediastinal lymph nodes are not enlarged by size criteria, presumably reactive in etiology. No bulky mediastinal, hilar or axillary lymphadenopathy. Lungs/Pleura: No focal airspace opacities. No pleural effusion or pneumothorax. The central pulmonary airways appear widely patent. Punctate granuloma are seen within the lungs bilaterally (images 45, 63 and 68, series 6). No discrete worrisome pulmonary nodules. Upper abdomen: Limited early arterial phase evaluation of the upper abdomen demonstrates mild nodularity of the hepatic contour as could be seen in the setting of early cirrhotic change. Postcholecystectomy. Small hiatal hernia. Debris is seen scattered throughout the esophagus without definitive evidence of esophageal wall thickening. Musculoskeletal: No acute or aggressive osseous abnormalities. Normal appearance of the thyroid. Regional soft tissues appear normal. IMPRESSION: 1. No acute cardiopulmonary disease. Specifically, no evidence of pulmonary embolism. 2. Coronary artery calcifications. Aortic Atherosclerosis (ICD10-I70.0). 3. Small hiatal hernia with debris scattered throughout the esophagus, nonspecific though could be seen in the setting of gastroesophageal reflux. 4. Mild nodularity of the hepatic contour as could be seen in the setting of early cirrhotic change. Correlation with LFTs is advised. Electronically Signed   By: Simonne Come M.D.   On:  01/06/2023 12:47   DG Chest 2 View  Result Date: 01/06/2023 CLINICAL DATA:  sob EXAM: CHEST - 2 VIEW COMPARISON:  CXR 01/06/23 FINDINGS: The heart size and mediastinal contours are within normal limits. Both lungs are clear. The visualized skeletal structures are unremarkable. Surgical clips in the right upper quadrant IMPRESSION: No focal airspace opacity Electronically Signed   By: Lorenza Cambridge M.D.   On: 01/06/2023 10:16   DG Chest 1 View  Result Date: 01/06/2023 CLINICAL DATA:  Chest pain EXAM: CHEST  1 VIEW COMPARISON:  09/30/2022 FINDINGS: The heart size and mediastinal contours are within normal limits. Both lungs are clear. The visualized skeletal structures are unremarkable. IMPRESSION: No active disease. Electronically Signed   By: Jasmine Pang M.D.   On: 01/06/2023 01:39    ECHO 10/01/2022  1. Left ventricular ejection fraction, by estimation, is 60 to 65%. The  left ventricle has normal function. The left ventricle has no regional  wall motion abnormalities. Left ventricular diastolic parameters are  consistent with Grade I diastolic  dysfunction (impaired relaxation).   2. Right ventricular systolic function is normal. The right ventricular  size is normal. There is normal pulmonary artery systolic pressure. The  estimated right ventricular systolic pressure is 23.5 mmHg.   3. The mitral valve is normal in structure. No evidence of mitral valve  regurgitation. No evidence of mitral stenosis.   4. The aortic valve was not well visualized. Aortic valve regurgitation  is not visualized. No aortic stenosis is present. Aortic valve area, by  VTI measures 2.81 cm.   5. The inferior vena cava is normal in size with greater than 50%  respiratory variability, suggesting right atrial pressure of 3 mmHg.   TELEMETRY reviewed by me (LT) 01/11/2023 : Sinus rhythm rate 80s to 90s with occasional premature atrial contractions.  EKG reviewed by me: NSR 98 PAC  Data reviewed by me (LT)  01/11/2023: Last ED note from 8/1, ED note from 8/5, admission H&P last 24h vitals tele labs imaging I/O   Principal Problem:   Acute hyponatremia Active Problems:   Diet-controlled diabetes mellitus (HCC)   AKI (acute kidney injury) (HCC)   Metabolic acidosis   Type 2 diabetes mellitus with hyperlipidemia (HCC)   Acute metabolic encephalopathy   Acute on chronic diastolic  CHF (congestive heart failure) (HCC)   Cognitive deficits    ASSESSMENT AND PLAN:  Deanna Joseph is a 57yoF with a PMH of cognitive impairment, HFpEF (60-65%, g1DD 09/2022), CKD3, DM2 who presented to [facility] ED 01/10/2023 with worsening lower extremity edema over the past 2 weeks and increased confusion.  Cardiology is consulted for assistance with the patient's heart failure.  # Hypervolemic hyponatremia # Acute on chronic HFpEF Presents with 2 weeks of worsening lower extremity edema, dyspnea on exertion, and orthopnea, refractory to oral Lasix prescribed in the emergency department.  Worsened with excess fluid intake over the past several days.  Sodium significantly low at 122 on admission, improving to 129 after first 12 hours of admission and initial IV diuresis. -Continue IV Lasix 40 mg twice daily for now -Start metoprolol succinate 12.5 mg daily -Maintain K >4, mag >2 -Repeat limited echo to assess any change in EF since 09/2022 -Discussed salt and fluid restriction with the patient and husband  # CKD 3 Monitor closely with diuresis.  This patient's plan of care was discussed and created with Dr. Melton Alar and he is in agreement.  Signed: Rebeca Allegra , PA-C 01/11/2023, 1:17 PM [facility] Cardiology

## 2023-01-11 NOTE — Assessment & Plan Note (Signed)
Grade 1 diastolic dysfunction (EF 60 to 65% 09/2022) Patient was seen by cardiology on 8/1 BNP just over 100 and chest x-ray clear but patient fluid overloaded IV Lasix Daily weights with intake and output monitoring Cardiology consult for additional recommendations

## 2023-01-11 NOTE — Assessment & Plan Note (Signed)
Sliding scale insulin coverage 

## 2023-01-11 NOTE — Assessment & Plan Note (Deleted)
Sliding scale insulin coverage 

## 2023-01-11 NOTE — ED Notes (Signed)
Dr. Damita Dunnings at the bedside

## 2023-01-11 NOTE — Assessment & Plan Note (Signed)
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

## 2023-01-11 NOTE — Assessment & Plan Note (Signed)
Acute metabolic encephalopathy Patient more confused than baseline and with sodium 122 down from 131 a few days prior Received IV Lasix in the ED 60 mg Uncertain etiology, given increased leg swelling possibly hypotonic though serum osmolality 281 Will get urine sodium and osmolality Will repeat an assess effect of IV Lasix May consider low rate 3% Will admit to stepdown for close observation

## 2023-01-11 NOTE — Progress Notes (Signed)
Progress Note   [name] ZOX:[id number] DOB: 12/09/1965 DOA: 01/10/2023     0 DOS: the patient was seen and examined on 01/11/2023    Subjective:  Patient seen and examined at bedside this morning Complains of bilateral lower extremity swelling and some exertional dyspnea and orthopnea consistent with CHF exacerbation I have consulted cardiologist for their input Denies chest pain cough nausea vomiting or abdominal pain  Brief hospital course: From HPI "Deanna Joseph is a 57 y.o. 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  At her first visit on 8/1 she had a CTA chest that was negative for PE and she was diuresed and discharged.  Her husband brought her in because of worsening lower extremity edema aching pain and increased confusion from her baseline described as increased repetitiveness, asking the same question over and over again.  She has been compliant with the Lasix and she has had intermittent chest discomfort.  He states for the last couple weeks, she has been eating every 2 hours and drinking a lot of water. ED course and data review: Vitals unremarkable.  Labs notable forB NP of 198, up from 131 5 days prior and sodium of 122, down from 131 5 days ago.  Creatinine was increased to 1.25 from her baseline around 1.09.  Bicarb 20.  Hemoglobin at baseline at 10.8, troponin 9.  Serum osmolality 281. EKG, personally viewed and interpreted showing sinus at 96 with LVH and nonspecific ST-T wave changes. CT head nonacute Chest x-ray nonacute Patient given a dose of Lasix 60 mg IV Hospitalist consulted for admission "    Assessment and Plan:  Acute hyponatremia likely in the setting of volume overload-improving Acute metabolic encephalopathy Patient more confused than baseline and with  sodium 122 down from 131 a few days prior We will continue IV Lasix as recommended by cardiology Follow-up on urine sodium and osmolality I do not believe patient needs 3% saline as sodium level is improving and mental status is better this morning Continue monitoring sodium levels Monitor neurochecks every 4 hours Nephrologist on board we appreciate input   Acute on chronic diastolic CHF (congestive heart failure) (HCC) Grade 1 diastolic dysfunction (EF 60 to 65% 09/2022) Patient was seen by cardiology on 8/1 BNP just over 100 and chest x-ray clear but patient fluid overloaded Continue IV Lasix 40 mg twice daily as recommended by cardiology Started on metoprolol 12.5 mg daily by cardiology today Monitor electrolytes closely while on Lasix Continue Daily weights with intake and output monitoring Follow-up repeat echocardiogram Plan of care discussed with cardiology team we appreciate input     Type 2 diabetes mellitus with hyperlipidemia (HCC) Sliding scale insulin coverage Continue current insulin therapy Monitor glucose closely   AKI (acute kidney injury) (HCC) likely secondary to cardiorenal as well as Lasix use Metabolic acidosis Continue management of heart failure as above Monitor renal function closely Renally dose all drugs     DVT prophylaxis: Lovenox   Consults: nephrology, cardiology   Advance Care Planning:   Code Status: Full code   Family Communication: Discussed with husband at bedside   Disposition Plan: Back to previous home environment    Physical Exam: Constitutional:      General: Middle aged female laying in bed in no acute distress HENT:  Head: Normocephalic and atraumatic.  Cardiovascular:     Rate and Rhythm: Normal rate and regular rhythm.     Heart sounds: Normal heart sounds.  Pulmonary:     Effort: Pulmonary effort is normal.     Breath sounds: Normal breath sounds.  Abdominal:     Palpations: Abdomen is soft.     Tenderness:  There is no abdominal tenderness.  Neurological:     Mental Status: She is easily aroused. Mental status is at baseline.  Musculoskeletal: 3+ bilateral lower extremity pitting edema    Vitals:   01/11/23 1200 01/11/23 1208 01/11/23 1500 01/11/23 1539  BP: (!) 142/79  136/82 132/64  Pulse: 96  98 88  Resp: 16  18 20   Temp:  98.1 F (36.7 C) 98.4 F (36.9 C) 98.7 F (37.1 C)  TempSrc:  Oral Oral   SpO2: 100%  100% 100%  Weight:      Height:        Data Reviewed: I have reviewed patient's lab results showing worsening renal function, previous documentation in the chart, admitting provider documentation, cardiology documentation, nephrology documentation as well as patient's vitals  F  Author: Loyce Dys, MD 01/11/2023 4:08 PM  For on call review [email or web address].

## 2023-01-11 NOTE — H&P (Addendum)
History and Physical    [name] WUJ:[id number] DOB: 04/20/1966 DOA: 01/10/2023 DOS: the patient was seen and examined on 01/11/2023 PCP: Hillery Aldo, MD  Patient coming from: Home  Chief Complaint:  Chief Complaint  Patient presents with   Leg Swelling    HPI: [name] is a 57 y.o. 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  At her first visit on 8/1 she had a CTA chest that was negative for PE and she was diuresed and discharged.  Her husband brought her in because of worsening lower extremity edema aching pain and increased confusion from her baseline described as increased repetitiveness, asking the same question over and over again.  She has been compliant with the Lasix and she has had intermittent chest discomfort.  He states for the last couple weeks, she has been eating every 2 hours and drinking a lot of water. ED course and data review: Vitals unremarkable.  Labs notable forB NP of 198, up from 131 5 days prior and sodium of 122, down from 131 5 days ago.  Creatinine was increased to 1.25 from her baseline around 1.09.  Bicarb 20.  Hemoglobin at baseline at 10.8, troponin 9.  Serum osmolality 281. EKG, personally viewed and interpreted showing sinus at 96 with LVH and nonspecific ST-T wave changes. CT head nonacute Chest x-ray nonacute Patient given a dose of Lasix 60 mg IV Hospitalist consulted for admission    Past Medical History:  Diagnosis Date   Diabetes mellitus without complication (HCC)    Past Surgical History:  Procedure Laterality Date   CESAREAN SECTION     CHOLECYSTECTOMY     DILATION AND CURETTAGE OF UTERUS     ESOPHAGOGASTRODUODENOSCOPY N/A 07/18/2019   Procedure: ESOPHAGOGASTRODUODENOSCOPY (EGD);  Surgeon: Toledo, Boykin Nearing, MD;  Location: [facility]  ENDOSCOPY;  Service: Gastroenterology;  Laterality: N/A;   Social History:  reports that she has never smoked. She has never used smokeless tobacco. She reports that she does not currently use alcohol. No history on file for drug use.  Allergies  Allergen Reactions   Penicillins     Rash    Sulfa Antibiotics Hives    Family History  Problem Relation Age of Onset   Breast cancer Maternal Aunt 55    Prior to Admission medications   Medication Sig Start Date End Date Taking? Authorizing Provider  acetaminophen (TYLENOL) 500 MG tablet Take 500 mg by mouth every 6 (six) hours as needed.    [provider]  feeding supplement, ENSURE ENLIVE, (ENSURE ENLIVE) LIQD Take 237 mLs by mouth 3 (three) times daily between meals. 07/30/19   Darlin Priestly, MD  folic acid (FOLVITE) 1 MG tablet Take 1 mg by mouth daily.    [provider]  gabapentin (NEURONTIN) 300 MG capsule Take 300 mg by mouth at bedtime.    [provider]  omeprazole (PRILOSEC) 20 MG capsule Take 20 mg by mouth daily.    [provider]  oxyCODONE (OXY IR/ROXICODONE) 5 MG immediate release tablet Take 1 tablet (5 mg total) by mouth every 6 (six) hours as needed for moderate pain or severe pain. 10/07/22   Pennie Banter, DO  polyethylene glycol (MIRALAX / GLYCOLAX) 17 g packet Take 17 g by mouth 2 (two) times daily. Can decrease to once  a day if having >2 BM's per day. 07/30/19   Darlin Priestly, MD  traZODone (DESYREL) 50 MG tablet Take 50 mg by mouth at bedtime.    [provider]    Physical Exam: Vitals:   01/10/23 1852 01/10/23 1856 01/10/23 2236 01/11/23 0139  BP: (!) 147/77  133/78 (!) 140/72  Pulse: 95  90 88  Resp: 18  18 18   Temp: 98.7 F (37.1 C)  98.3 F (36.8 C) 98.5 F (36.9 C)  TempSrc: Oral  Oral Oral  SpO2: 99%  99% 99%  Weight:  85.7 kg    Height:  5' (1.524 m)     Physical Exam Vitals and nursing note reviewed.  Constitutional:      General: She is sleeping. She  is not in acute distress. HENT:     Head: Normocephalic and atraumatic.  Cardiovascular:     Rate and Rhythm: Normal rate and regular rhythm.     Heart sounds: Normal heart sounds.  Pulmonary:     Effort: Pulmonary effort is normal.     Breath sounds: Normal breath sounds.  Abdominal:     Palpations: Abdomen is soft.     Tenderness: There is no abdominal tenderness.  Neurological:     Mental Status: She is easily aroused. Mental status is at baseline.     Labs on Admission: I have personally reviewed following labs and imaging studies  CBC: Recent Labs  Lab 01/06/23 0058 01/06/23 1027 01/10/23 1858  WBC 10.6* 8.5 8.2  NEUTROABS  --  6.5  --   HGB 10.9* 10.1* 10.8*  HCT 32.3* 29.8* 32.7*  MCV 92.6 90.9 92.1  PLT 307 268 149*   Basic Metabolic Panel: Recent Labs  Lab 01/06/23 0058 01/06/23 1027 01/10/23 1858  NA 132* 131* 122*  K 4.1 4.5 3.9  CL 100 98 92*  CO2 22 23 20*  GLUCOSE 200* 198* 179*  BUN 20 23* 26*  CREATININE 1.09* 1.11* 1.25*  CALCIUM 8.3* 8.1* 8.0*   GFR: Estimated Creatinine Clearance: 48.9 mL/min (A) (by C-G formula based on SCr of 1.25 mg/dL (H)). Liver Function Tests: Recent Labs  Lab 01/06/23 0058  AST 22  ALT 16  ALKPHOS 72  BILITOT 0.4  PROT 6.8  ALBUMIN 3.5   No results for input(s): "LIPASE", "AMYLASE" in the last 168 hours. No results for input(s): "AMMONIA" in the last 168 hours. Coagulation Profile: No results for input(s): "INR", "PROTIME" in the last 168 hours. Cardiac Enzymes: No results for input(s): "CKTOTAL", "CKMB", "CKMBINDEX", "TROPONINI" in the last 168 hours. BNP (last 3 results) No results for input(s): "PROBNP" in the last 8760 hours. HbA1C: No results for input(s): "HGBA1C" in the last 72 hours. CBG: Recent Labs  Lab 01/06/23 1102  GLUCAP 184*   Lipid Profile: No results for input(s): "CHOL", "HDL", "LDLCALC", "TRIG", "CHOLHDL", "LDLDIRECT" in the last 72 hours. Thyroid Function Tests: No results for  input(s): "TSH", "T4TOTAL", "FREET4", "T3FREE", "THYROIDAB" in the last 72 hours. Anemia Panel: No results for input(s): "VITAMINB12", "FOLATE", "FERRITIN", "TIBC", "IRON", "RETICCTPCT" in the last 72 hours. Urine analysis:    Component Value Date/Time   COLORURINE YELLOW (A) 09/30/2022 0646   APPEARANCEUR CLOUDY (A) 09/30/2022 0646   LABSPEC 1.006 09/30/2022 0646   PHURINE 6.0 09/30/2022 0646   GLUCOSEU NEGATIVE 09/30/2022 0646   HGBUR SMALL (A) 09/30/2022 0646   BILIRUBINUR NEGATIVE 09/30/2022 0646   KETONESUR NEGATIVE 09/30/2022 0646   PROTEINUR 100 (A) 09/30/2022 0646   NITRITE POSITIVE (  A) 09/30/2022 0646   LEUKOCYTESUR LARGE (A) 09/30/2022 0646    Radiological Exams on Admission: CT HEAD WO CONTRAST ( )  Result Date: 01/11/2023 CLINICAL DATA:  Altered mental status EXAM: CT HEAD WITHOUT CONTRAST TECHNIQUE: Contiguous axial images were obtained from the base of the skull through the vertex without intravenous contrast. RADIATION DOSE REDUCTION: This exam was performed according to the departmental dose-optimization program which includes automated exposure control, adjustment of the mA and/or kV according to patient size and/or use of iterative reconstruction technique. COMPARISON:  10/24/2022 FINDINGS: Brain: No acute intracranial abnormality. Specifically, no hemorrhage, hydrocephalus, mass lesion, acute infarction, or significant intracranial injury. Vascular: No hyperdense vessel or unexpected calcification. Skull: No acute calvarial abnormality. Sinuses/Orbits: No acute findings Other: None IMPRESSION: No acute intracranial abnormality. Electronically Signed   By: Charlett Nose M.D.   On: 01/11/2023 00:04   DG Chest 2 View  Result Date: 01/10/2023 CLINICAL DATA:  Shortness of breath EXAM: CHEST - 2 VIEW COMPARISON:  Chest x-ray 01/06/2023 FINDINGS: The heart size and mediastinal contours are within normal limits. Both lungs are clear. Chronic left humeral neck fracture again noted.  IMPRESSION: No active cardiopulmonary disease. Electronically Signed   By: Darliss Cheney M.D.   On: 01/10/2023 19:58     Data Reviewed: Relevant notes from primary care and specialist visits, past discharge summaries as available in EHR, including Care Everywhere. Prior diagnostic testing as pertinent to current admission diagnoses Updated medications and problem lists for reconciliation ED course, including vitals, labs, imaging, treatment and response to treatment Triage notes, nursing and pharmacy notes and ED provider's notes Notable results as noted in HPI   Assessment and Plan: * Acute hyponatremia Acute metabolic encephalopathy Patient more confused than baseline and with sodium 122 down from 131 a few days prior Received IV Lasix in the ED 60 mg Uncertain etiology, given increased leg swelling possibly hypotonic though serum osmolality 281 Will get urine sodium and osmolality Will repeat an assess effect of IV Lasix May consider low rate 3% Will admit to stepdown for close observation  Acute on chronic diastolic CHF (congestive heart failure) (HCC) Grade 1 diastolic dysfunction (EF 60 to 65% 09/2022) Patient was seen by cardiology on 8/1 BNP just over 100 and chest x-ray clear but patient fluid overloaded IV Lasix Daily weights with intake and output monitoring Cardiology consult for additional recommendations  Cognitive deficits Nonalcoholic Warnicke Korsakoff syndrome (per past record)  Type 2 diabetes mellitus with hyperlipidemia (HCC) Sliding scale insulin coverage  AKI (acute kidney injury) (HCC) Metabolic acidosis Likely secondary to Lasix Monitor renal function with ongoing diuretics     DVT prophylaxis: Lovenox  Consults: nephrology, cardiology  Advance Care Planning:   Code Status: Prior   Family Communication: husband at bedside  Disposition Plan: Back to previous home environment  Severity of Illness: The appropriate patient status for this  patient is INPATIENT. Inpatient status is judged to be reasonable and necessary in order to provide the required intensity of service to ensure the patient's safety. The patient's presenting symptoms, physical exam findings, and initial radiographic and laboratory data in the context of their chronic comorbidities is felt to place them at high risk for further clinical deterioration. Furthermore, it is not anticipated that the patient will be medically stable for discharge from the hospital within 2 midnights of admission.   * I certify that at the point of admission it is my clinical judgment that the patient will require inpatient hospital care spanning beyond 2  midnights from the point of admission due to high intensity of service, high risk for further deterioration and high frequency of surveillance required.*  Author: Andris Baumann, MD 01/11/2023 1:54 AM  For on call review [email or web address].

## 2023-01-11 NOTE — ED Notes (Signed)
Pt & Spouse provided a sandwich tray and water.

## 2023-01-11 NOTE — Assessment & Plan Note (Signed)
Metabolic acidosis Likely secondary to Lasix Monitor renal function with ongoing diuretics

## 2023-01-12 DIAGNOSIS — N183 Chronic kidney disease, stage 3 unspecified: Secondary | ICD-10-CM | POA: Diagnosis not present

## 2023-01-12 DIAGNOSIS — I5033 Acute on chronic diastolic (congestive) heart failure: Secondary | ICD-10-CM | POA: Diagnosis not present

## 2023-01-12 DIAGNOSIS — E871 Hypo-osmolality and hyponatremia: Secondary | ICD-10-CM | POA: Diagnosis not present

## 2023-01-12 LAB — GLUCOSE, CAPILLARY
Glucose-Capillary: 101 mg/dL — ABNORMAL HIGH (ref 70–99)
Glucose-Capillary: 197 mg/dL — ABNORMAL HIGH (ref 70–99)
Glucose-Capillary: 190 mg/dL — ABNORMAL HIGH (ref 70–99)
Glucose-Capillary: 159 mg/dL — ABNORMAL HIGH (ref 70–99)

## 2023-01-12 LAB — CREATININE, SERUM
Creatinine, Ser: 1.52 mg/dL — ABNORMAL HIGH (ref 0.44–1.00)
GFR, Estimated: 40 mL/min — ABNORMAL LOW (ref >60)

## 2023-01-12 LAB — MAGNESIUM: Magnesium: 2 mg/dL (ref 1.7–2.4)

## 2023-01-12 LAB — POTASSIUM: Potassium: 4.3 mmol/L (ref 3.5–5.1)

## 2023-01-12 MED ORDER — MELATONIN 5 MG PO TABS
5.0000 mg | ORAL_TABLET | Freq: Every evening | ORAL | Status: DC | PRN
  Administered 2023-01-12 – 2023-01-14 (×3): 5 mg via ORAL
  Filled 2023-01-12 (×3): qty 1

## 2023-01-12 MED ORDER — ALPRAZOLAM 0.5 MG PO TABS
0.5000 mg | ORAL_TABLET | Freq: Two times a day (BID) | ORAL | Status: DC | PRN
  Administered 2023-01-12 – 2023-01-14 (×5): 0.5 mg via ORAL
  Filled 2023-01-12 (×5): qty 1

## 2023-01-12 MED ORDER — FUROSEMIDE 40 MG PO TABS
40.0000 mg | ORAL_TABLET | Freq: Every day | ORAL | Status: DC
  Administered 2023-01-13 – 2023-01-15 (×3): 40 mg via ORAL
  Filled 2023-01-12 (×3): qty 1

## 2023-01-12 MED ORDER — TRAZODONE HCL 100 MG PO TABS
100.0000 mg | ORAL_TABLET | Freq: Every day | ORAL | Status: DC
  Administered 2023-01-12 – 2023-01-14 (×3): 100 mg via ORAL
  Filled 2023-01-12 (×3): qty 1

## 2023-01-12 MED ORDER — GLUCERNA SHAKE PO LIQD
237.0000 mL | Freq: Three times a day (TID) | ORAL | Status: DC
  Administered 2023-01-12 – 2023-01-15 (×9): 237 mL via ORAL

## 2023-01-12 NOTE — Progress Notes (Addendum)
[facility] CLINIC CARDIOLOGY CONSULT NOTE       Patient ID: Deanna Joseph MRN: [id number] DOB/AGE: [birth year] 57 y.o.  Admit date: 01/10/2023 Referring Physician Dr. Rosezetta Schlatter  Primary Physician Dr. Hillery Aldo Primary Cardiologist Marijo Conception, NP  Reason for Consultation CHF  HPI: Deanna Joseph is a 56yoF with a PMH of cognitive impairment, HFpEF (60-65%, g1DD 09/2022), CKD3, DM2 who presented to [facility] ED 01/10/2023 with worsening lower extremity edema over the past 2 weeks and increased confusion.  Cardiology is consulted for assistance with the patient's heart failure.  Interval History:  - euvolemic on exam today, peripheral edema resolving with appropriate rise in Na from 122 -- 133 today - very anxious, main complaint is increased hunger. No chest pain. Continues to feel breathless, but is not hypoxic or in distress    Review of systems complete and found to be negative unless listed above     Past Medical History:  Diagnosis Date   Diabetes mellitus without complication (HCC)     Past Surgical History:  Procedure Laterality Date   CESAREAN SECTION     CHOLECYSTECTOMY     DILATION AND CURETTAGE OF UTERUS     ESOPHAGOGASTRODUODENOSCOPY N/A 07/18/2019   Procedure: ESOPHAGOGASTRODUODENOSCOPY (EGD);  Surgeon: Toledo, Boykin Nearing, MD;  Location: [facility] ENDOSCOPY;  Service: Gastroenterology;  Laterality: N/A;    Medications Prior to Admission  Medication Sig Dispense Refill Last Dose   acetaminophen (TYLENOL) 500 MG tablet Take 500 mg by mouth every 6 (six) hours as needed.   prn   furosemide (LASIX) 20 MG tablet Take 20 mg by mouth daily.   01/10/2023 at 0930   omeprazole (PRILOSEC) 40 MG capsule Take 40 mg by mouth daily.   01/10/2023 at 0930   traMADol (ULTRAM) 50 MG tablet Take 50 mg by mouth every 6 (six) hours as needed for moderate pain.   01/10/2023   traZODone (DESYREL) 50 MG tablet Take 50 mg by mouth at bedtime.   01/09/2023 at 2300   feeding supplement, ENSURE ENLIVE,  (ENSURE ENLIVE) LIQD Take 237 mLs by mouth 3 (three) times daily between meals.      oxyCODONE (OXY IR/ROXICODONE) 5 MG immediate release tablet Take 1 tablet (5 mg total) by mouth every 6 (six) hours as needed for moderate pain or severe pain. (Patient not taking: Reported on 01/11/2023) 30 tablet 0 Not Taking   polyethylene glycol (MIRALAX / GLYCOLAX) 17 g packet Take 17 g by mouth 2 (two) times daily. Can decrease to once a day if having >2 BM's per day. (Patient not taking: Reported on 01/11/2023)  0 Not Taking   Social History   Socioeconomic History   Marital status: Married    Spouse name: Not on file   Number of children: Not on file   Years of education: Not on file   Highest education level: Not on file  Occupational History   Not on file  Tobacco Use   Smoking status: Never   Smokeless tobacco: Never  Substance and Sexual Activity   Alcohol use: Not Currently   Drug use: Not on file   Sexual activity: Not on file  Other Topics Concern   Not on file  Social History Narrative   Not on file   Social Determinants of Health   Financial Resource Strain: Low Risk  (02/26/2020)   Received from [facility]   Overall Financial Resource Strain (CARDIA)    Difficulty of Paying Living Expenses: Not  very hard  Recent Concern: Financial Resource Strain - Medium Risk (01/13/2020)   Received from [facility]   Overall Financial Resource Strain (CARDIA)    Difficulty of Paying Living Expenses: Somewhat hard  Food Insecurity: No Food Insecurity (01/11/2023)   Hunger Vital Sign    Worried About Running Out of Food in the Last Year: Never true    Ran Out of Food in the Last Year: Never true  Transportation Needs: No Transportation Needs (01/11/2023)   PRAPARE - Administrator, Civil Service (Medical): No    Lack of Transportation (Non-Medical): No  Physical Activity: Not on file  Stress: Not on file  Social Connections: Not on file  Intimate Partner  Violence: Not At Risk (01/11/2023)   Humiliation, Afraid, Rape, and Kick questionnaire    Fear of Current or Ex-Partner: No    Emotionally Abused: No    Physically Abused: No    Sexually Abused: No    Family History  Problem Relation Age of Onset   Breast cancer Maternal Aunt 45      Intake/Output Summary (Last 24 hours) at 01/12/2023 0758 Last data filed at 01/11/2023 1911 Gross per 24 hour  Intake 240 ml  Output 850 ml  Net -610 ml    Vitals:   01/11/23 1956 01/12/23 0004 01/12/23 0402 01/12/23 0733  BP: (!) 148/64 (!) 165/71 (!) 151/52 (!) 145/78  Pulse: 81 84 80 91  Resp: 20 18 18 20   Temp: 97.7 F (36.5 C) 98.3 F (36.8 C) 98.5 F (36.9 C) 98.2 F (36.8 C)  TempSrc:      SpO2: 100% 100% 99% 100%  Weight:      Height:        PHYSICAL EXAM General: middle aged female , well nourished, in no acute distress. Sitting up in bed with husband present  HEENT:  Normocephalic and atraumatic. Neck:  No JVD.  Lungs: Normal respiratory effort on room air. Clear bilaterally to auscultation. No wheezes, crackles, rhonchi.  Heart: HRRR . Normal S1 and S2 without gallops or murmurs.  Abdomen: Non-distended appearing with excess adiposity.  Msk: Normal strength and tone for age. Extremities: Warm and well perfused. No clubbing, cyanosis. Trace left pedal edema, LE edema resolving.  Neuro: awake and alert Psych:  anxious mood. answers simple questions appropriately, but is a limited historian.   Labs: Basic Metabolic Panel: Recent Labs    01/10/23 1858 01/11/23 0227 01/11/23 0435 01/11/23 1211 01/11/23 1601 01/12/23 0004 01/12/23 0355  NA 122* 126*   < > 129*   < > 130* 132*  K 3.9  --   --  4.2  --   --   --   CL 92*  --   --   --   --   --   --   CO2 20*  --   --   --   --   --   --   GLUCOSE 179*  --   --   --   --   --   --   BUN 26*  --   --   --   --   --   --   CREATININE 1.25* 1.35*  --   --   --   --   --   CALCIUM 8.0*  --   --   --   --   --   --   MG  --    --   --  2.0  --   --   --    < > = values in this interval not displayed.   Liver Function Tests: No results for input(s): "AST", "ALT", "ALKPHOS", "BILITOT", "PROT", "ALBUMIN" in the last 72 hours. No results for input(s): "LIPASE", "AMYLASE" in the last 72 hours. CBC: Recent Labs    01/11/23 0227 01/12/23 0355  WBC 7.6 6.8  NEUTROABS  --  4.0  HGB 10.3* 10.6*  HCT 30.5* 31.3*  MCV 90.8 90.5  PLT 271 304   Cardiac Enzymes: Recent Labs    01/10/23 1858 01/10/23 2320  TROPONINIHS 8 9   BNP: Recent Labs    01/10/23 2355  BNP 198.7*   D-Dimer: No results for input(s): "DDIMER" in the last 72 hours. Hemoglobin A1C: No results for input(s): "HGBA1C" in the last 72 hours. Fasting Lipid Panel: No results for input(s): "CHOL", "HDL", "LDLCALC", "TRIG", "CHOLHDL", "LDLDIRECT" in the last 72 hours. Thyroid Function Tests: No results for input(s): "TSH", "T4TOTAL", "T3FREE", "THYROIDAB" in the last 72 hours.  Invalid input(s): "FREET3" Anemia Panel: No results for input(s): "VITAMINB12", "FOLATE", "FERRITIN", "TIBC", "IRON", "RETICCTPCT" in the last 72 hours.   Radiology: CT HEAD WO CONTRAST ( )  Result Date: 01/11/2023 CLINICAL DATA:  Altered mental status EXAM: CT HEAD WITHOUT CONTRAST TECHNIQUE: Contiguous axial images were obtained from the base of the skull through the vertex without intravenous contrast. RADIATION DOSE REDUCTION: This exam was performed according to the departmental dose-optimization program which includes automated exposure control, adjustment of the mA and/or kV according to patient size and/or use of iterative reconstruction technique. COMPARISON:  10/24/2022 FINDINGS: Brain: No acute intracranial abnormality. Specifically, no hemorrhage, hydrocephalus, mass lesion, acute infarction, or significant intracranial injury. Vascular: No hyperdense vessel or unexpected calcification. Skull: No acute calvarial abnormality. Sinuses/Orbits: No acute findings  Other: None IMPRESSION: No acute intracranial abnormality. Electronically Signed   By: Charlett Nose M.D.   On: 01/11/2023 00:04   DG Chest 2 View  Result Date: 01/10/2023 CLINICAL DATA:  Shortness of breath EXAM: CHEST - 2 VIEW COMPARISON:  Chest x-ray 01/06/2023 FINDINGS: The heart size and mediastinal contours are within normal limits. Both lungs are clear. Chronic left humeral neck fracture again noted. IMPRESSION: No active cardiopulmonary disease. Electronically Signed   By: Darliss Cheney M.D.   On: 01/10/2023 19:58   CT Angio Chest PE W/Cm &/Or Wo Cm  Result Date: 01/06/2023 CLINICAL DATA:  Severe chest pain. New onset CHF. Evaluate for pulmonary embolism. EXAM: CT ANGIOGRAPHY CHEST WITH CONTRAST TECHNIQUE: Multidetector CT imaging of the chest was performed using the standard protocol during bolus administration of intravenous contrast. Multiplanar CT image reconstructions and MIPs were obtained to evaluate the vascular anatomy. RADIATION DOSE REDUCTION: This exam was performed according to the departmental dose-optimization program which includes automated exposure control, adjustment of the mA and/or kV according to patient size and/or use of iterative reconstruction technique. CONTRAST:  75mL OMNIPAQUE IOHEXOL 350 MG/ML SOLN COMPARISON:  Chest radiograph-01/06/2023 FINDINGS: Vascular Findings: There is adequate opacification of the pulmonary arterial system with the main pulmonary artery measuring 433 Hounsfield units. There are no discrete filling defects within the pulmonary arterial tree to suggest pulmonary embolism. Normal caliber of the main pulmonary artery. Normal heart size. No pericardial effusion. Coronary artery calcifications. No evidence of thoracic aortic aneurysm or dissection on this nongated examination. Bovine configuration of the aortic arch. The branch vessels of the aortic arch appear widely patent throughout their imaged courses. Review of the MIP images confirms  the above  findings. ---------------------------------------------------------------------------------- Nonvascular Findings: Mediastinum/Lymph Nodes: Scattered mediastinal lymph nodes are not enlarged by size criteria, presumably reactive in etiology. No bulky mediastinal, hilar or axillary lymphadenopathy. Lungs/Pleura: No focal airspace opacities. No pleural effusion or pneumothorax. The central pulmonary airways appear widely patent. Punctate granuloma are seen within the lungs bilaterally (images 45, 63 and 68, series 6). No discrete worrisome pulmonary nodules. Upper abdomen: Limited early arterial phase evaluation of the upper abdomen demonstrates mild nodularity of the hepatic contour as could be seen in the setting of early cirrhotic change. Postcholecystectomy. Small hiatal hernia. Debris is seen scattered throughout the esophagus without definitive evidence of esophageal wall thickening. Musculoskeletal: No acute or aggressive osseous abnormalities. Normal appearance of the thyroid. Regional soft tissues appear normal. IMPRESSION: 1. No acute cardiopulmonary disease. Specifically, no evidence of pulmonary embolism. 2. Coronary artery calcifications. Aortic Atherosclerosis (ICD10-I70.0). 3. Small hiatal hernia with debris scattered throughout the esophagus, nonspecific though could be seen in the setting of gastroesophageal reflux. 4. Mild nodularity of the hepatic contour as could be seen in the setting of early cirrhotic change. Correlation with LFTs is advised. Electronically Signed   By: Simonne Come M.D.   On: 01/06/2023 12:47   DG Chest 2 View  Result Date: 01/06/2023 CLINICAL DATA:  sob EXAM: CHEST - 2 VIEW COMPARISON:  CXR 01/06/23 FINDINGS: The heart size and mediastinal contours are within normal limits. Both lungs are clear. The visualized skeletal structures are unremarkable. Surgical clips in the right upper quadrant IMPRESSION: No focal airspace opacity Electronically Signed   By: Lorenza Cambridge M.D.    On: 01/06/2023 10:16   DG Chest 1 View  Result Date: 01/06/2023 CLINICAL DATA:  Chest pain EXAM: CHEST  1 VIEW COMPARISON:  09/30/2022 FINDINGS: The heart size and mediastinal contours are within normal limits. Both lungs are clear. The visualized skeletal structures are unremarkable. IMPRESSION: No active disease. Electronically Signed   By: Jasmine Pang M.D.   On: 01/06/2023 01:39    ECHO 10/01/2022  1. Left ventricular ejection fraction, by estimation, is 60 to 65%. The  left ventricle has normal function. The left ventricle has no regional  wall motion abnormalities. Left ventricular diastolic parameters are  consistent with Grade I diastolic  dysfunction (impaired relaxation).   2. Right ventricular systolic function is normal. The right ventricular  size is normal. There is normal pulmonary artery systolic pressure. The  estimated right ventricular systolic pressure is 23.5 mmHg.   3. The mitral valve is normal in structure. No evidence of mitral valve  regurgitation. No evidence of mitral stenosis.   4. The aortic valve was not well visualized. Aortic valve regurgitation  is not visualized. No aortic stenosis is present. Aortic valve area, by  VTI measures 2.81 cm.   5. The inferior vena cava is normal in size with greater than 50%  respiratory variability, suggesting right atrial pressure of 3 mmHg.   TELEMETRY reviewed by me (LT) 01/12/2023 : Sinus rhythm rate 80s to 90s with occasional premature atrial contractions.  EKG reviewed by me: NSR 98 PAC  Data reviewed by me (LT) 01/12/2023: Last ED note from 8/1, ED note from 8/5, admission H&P last 24h vitals tele labs imaging I/O   Principal Problem:   Acute hyponatremia Active Problems:   Diet-controlled diabetes mellitus (HCC)   AKI (acute kidney injury) (HCC)   Metabolic acidosis   Type 2 diabetes mellitus with hyperlipidemia (HCC)   Acute metabolic encephalopathy   Acute on chronic  diastolic CHF (congestive heart failure)  (HCC)   Cognitive deficits    ASSESSMENT AND PLAN:  Deanna Joseph is a 56yoF with a PMH of cognitive impairment, HFpEF (60-65%, g1DD 09/2022), CKD3, DM2 who presented to [facility] ED 01/10/2023 with worsening lower extremity edema over the past 2 weeks and increased confusion.  Cardiology is consulted for assistance with the patient's heart failure.  # Hypervolemic hyponatremia # Acute on chronic HFpEF Presents with 2 weeks of worsening lower extremity edema, dyspnea on exertion, and orthopnea, refractory to oral Lasix prescribed in the emergency department.  Worsened with excess fluid intake over the past several days.  Sodium significantly low at 122 on admission, improving to 133 after diuresis. Echo without significant change form prior study, mild -mod AI -give IV lasix 40mg  this AM, then start 40mg  PO lasix tomorrow.  -continue metoprolol succinate 12.5 mg daily -Maintain K >4, mag >2 -Discussed salt and fluid restriction with the patient and husband  # CKD 3 Monitor closely with diuresis.  Ok for discharge today from a cardiac perspective. Will arrange for follow up in clinic with Dr. Melton Alar / Marijo Conception, NP in 1-2 weeks.    This patient's plan of care was discussed and created with Dr. Melton Alar and he is in agreement.  Signed: Rebeca Allegra , PA-C 01/12/2023, 7:58 AM [facility] Cardiology

## 2023-01-12 NOTE — Progress Notes (Addendum)
Progress Note   [name] ZOX:[id number] DOB: 24-Feb-1966 DOA: 01/10/2023     1 DOS: the patient was seen and examined on 01/12/2023    Brief hospital course: From HPI "Deanna Joseph is a 57 y.o. 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  At her first visit on 8/1 she had a CTA chest that was negative for PE and she was diuresed and discharged.  Her husband brought her in because of worsening lower extremity edema aching pain and increased confusion from her baseline described as increased repetitiveness, asking the same question over and over again.  She has been compliant with the Lasix and she has had intermittent chest discomfort.  He states for the last couple weeks, she has been eating every 2 hours and drinking a lot of water. ED course and data review: Vitals unremarkable.  Labs notable forB NP of 198, up from 131 5 days prior and sodium of 122, down from 131 5 days ago.  Creatinine was increased to 1.25 from her baseline around 1.09.  Bicarb 20.  Hemoglobin at baseline at 10.8, troponin 9.  Serum osmolality 281. EKG, personally viewed and interpreted showing sinus at 96 with LVH and nonspecific ST-T wave changes. CT head nonacute Chest x-ray nonacute Patient given a dose of Lasix 60 mg IV Hospitalist consulted for admission "   Further hospital course and management as outlined below.   Subjective:  Patient seen and examined at bedside this morning, husband present. Pt tearful, complaining of being very hungry having just eaten her breakfast and an Ensure drink. Husband states this also happens frequently at home, like pt cannot get enough to eat. Pt otherwise has no complaints, but perseverates only on her severe hunger and not feeling well do to that.   Assessment and Plan: Acute  hyponatremia likely in the setting of volume overload-improving Acute metabolic encephalopathy- due to hyponatremia, now resolved, mentation at baseline Patient more confused than baseline and with sodium 122 down from 131 a few days prior Pt was diuresed with IV Lasix and sodium improved Na today 133 (initial Na 122) Continue monitoring sodium levels Nephrology following - appreciate recommendations   Acute on chronic diastolic CHF (congestive heart failure) (HCC) Grade 1 diastolic dysfunction (EF 60 to 65% 09/2022) Patient was seen by cardiology on 8/1 BNP just over 100 and chest x-ray clear but patient fluid overloaded Treated with IV Lasix 40 mg BID -- last dose to be given this AM Started on metoprolol 12.5 mg daily  Monitor electrolytes and renal function closely  Daily weights with intake and output monitoring Echocardiogram - EF 60-65%, no LV WMA's, grade I DD Cardiology following - appreciate recommendations     Type 2 diabetes mellitus with hyperlipidemia (HCC) Sliding scale insulin coverage Continue current insulin therapy Monitor glucose closely   AKI superimposed on CKD stage IIIa Metabolic acidosis Continue management of heart failure as above Monitor renal function closely Renally dose all drugs Nephrology following -- to arrange outpatient follow up     DVT prophylaxis: Lovenox   Consults: nephrology, cardiology   Advance Care Planning:   Code Status: Full code   Family Communication: husband at bedside on rounds   Disposition Plan: Back to previous home environment. PT/OT evaluations pending.    Physical Exam: General exam: awake, alert,  no acute distress, tearful complaining of hunger HEENT: moist mucus membranes, hearing grossly normal  Respiratory system: CTAB, no wheezes, rales or rhonchi, normal respiratory effort. Cardiovascular system: normal S1/S2, RRR, mildpedal edema.   Gastrointestinal system: soft, NT, ND, no HSM felt, +bowel  sounds. Central nervous system: no gross focal neurologic deficits, normal speech Extremities: moves all, no edema, normal tone Skin: dry, intact, normal temperature Psychiatry: normal mood, congruent affect, abnormal judgement and insight      Vitals:   01/12/23 0004 01/12/23 0402 01/12/23 0733 01/12/23 1140  BP: (!) 165/71 (!) 151/52 (!) 145/78 131/62  Pulse: 84 80 91 91  Resp: 18 18 20 18   Temp: 98.3 F (36.8 C) 98.5 F (36.9 C) 98.2 F (36.8 C) (!) 97.4 F (36.3 C)  TempSrc:      SpO2: 100% 99% 100% 100%  Weight:      Height:        Data Reviewed: Notable labs ---  Na 133 improving, Cr 1.52 trending up, Hbg 10.6   Author: Pennie Banter, DO 01/12/2023 1:40 PM  For on call review [email or web address].

## 2023-01-12 NOTE — Discharge Instructions (Signed)

## 2023-01-12 NOTE — Evaluation (Signed)
Occupational Therapy Evaluation Patient Details Name: Deanna Joseph MRN: [id number] DOB: 1965-12-06 Today's Date: 01/12/2023   History of Present Illness Pt is a 57 y/o female presenting to hospital with dyspnea, worsening LE edema and pain. Admitted to hospital for acute metabolic encephalopathy, LE swelling, and acute on chronic CHF. PMH includes: nonalcoholic Wernicke's Korsakoff syndrome, T2DM, CKD stage 3, and G1DD.   Clinical Impression   Deanna Joseph was seen for OT evaluation this date. Prior to hospital admission, pt has PRN assist for ADLs. Pt lives with spouse. Pt currently requires CGA doff underwear in standing. MIN A don underwear, assist for threading over RLE in sitting. Pt would benefit from skilled OT to address noted impairments and functional limitations (see below for any additional details). Upon hospital discharge, recommend no OT follow up.     If plan is discharge home, recommend the following: Supervision due to cognitive status    Functional Status Assessment  Patient has had a recent decline in their functional status and demonstrates the ability to make significant improvements in function in a reasonable and predictable amount of time.  Equipment Recommendations  BSC/3in1    Recommendations for Other Services       Precautions / Restrictions Precautions Precautions: Fall Restrictions Weight Bearing Restrictions: No      Mobility Bed Mobility Overal bed mobility: Needs Assistance Bed Mobility: Supine to Sit, Sit to Supine     Supine to sit: Min assist Sit to supine: Contact guard assist        Transfers Overall transfer level: Needs assistance Equipment used: Rolling walker (2 wheels) Transfers: Sit to/from Stand Sit to Stand: Contact guard assist                  Balance Overall balance assessment: Needs assistance Sitting-balance support: Feet supported, No upper extremity supported Sitting balance-Leahy Scale: Good      Standing balance support: No upper extremity supported, During functional activity Standing balance-Leahy Scale: Fair                             ADL either performed or assessed with clinical judgement   ADL Overall ADL's : Needs assistance/impaired                                       General ADL Comments: CGA doff underwearing in standing. MIN A don underwear, assist for threading over RLE in sitting.      Pertinent Vitals/Pain Pain Assessment Pain Assessment: Faces Faces Pain Scale: Hurts little more Pain Location: generalized Pain Descriptors / Indicators: Discomfort Pain Intervention(s): Limited activity within patient's tolerance     Extremity/Trunk Assessment Upper Extremity Assessment Upper Extremity Assessment: Overall WFL for tasks assessed LUE Deficits / Details: per spouse pt was cleared from sling ~2 weeks ago   Lower Extremity Assessment Lower Extremity Assessment: Generalized weakness       Communication Communication Communication: No apparent difficulties   Cognition Arousal: Alert Behavior During Therapy: WFL for tasks assessed/performed Overall Cognitive Status: History of cognitive impairments - at baseline                                                  Home Living Family/patient  expects to be discharged to:: Private residence Living Arrangements: Spouse/significant other Available Help at Discharge: Family;Available PRN/intermittently Type of Home: House Home Access: Level entry     Home Layout: One level     Bathroom Shower/Tub: Chief Strategy Officer: Handicapped height Bathroom Accessibility: Yes   Home Equipment: Rollator (4 wheels);Cane - single point;Shower seat          Prior Functioning/Environment Prior Level of Function : Needs assist;History of Falls (last six months)             Mobility Comments: Needs assist to walk limited community distances with  Rollator. Uses motorized shopping carts as needed in stores. ADLs Comments: Spouse assists when needed with most ADL's depending on the day, Independent with toileting        OT Problem List: Decreased strength;Decreased activity tolerance;Decreased safety awareness;Decreased range of motion;Decreased cognition      OT Treatment/Interventions: Self-care/ADL training;Therapeutic exercise;Energy conservation;DME and/or AE instruction;Therapeutic activities    OT Goals(Current goals can be found in the care plan section) Acute Rehab OT Goals Patient Stated Goal: to go home OT Goal Formulation: With patient/family Time For Goal Achievement: 01/26/23 Potential to Achieve Goals: Good ADL Goals Pt Will Perform Grooming: with modified independence;standing Pt Will Perform Lower Body Dressing: with modified independence;sit to/from stand  OT Frequency: Min 1X/week    Co-evaluation              AM-PAC OT "6 Clicks" Daily Activity     Outcome Measure Help from another person eating meals?: None Help from another person taking care of personal grooming?: A Little Help from another person toileting, which includes using toliet, bedpan, or urinal?: A Little Help from another person bathing (including washing, rinsing, drying)?: A Little Help from another person to put on and taking off regular upper body clothing?: None Help from another person to put on and taking off regular lower body clothing?: A Little 6 Click Score: 20   End of Session Nurse Communication: Mobility status  Activity Tolerance: Patient tolerated treatment well Patient left: in bed;with call bell/phone within reach;with family/visitor present  OT Visit Diagnosis: Unsteadiness on feet (R26.81);Muscle weakness (generalized) (M62.81)                Time: 3244-0102 OT Time Calculation (min): 12 min Charges:  OT General Charges $OT Visit: 1 Visit OT Evaluation $OT Eval Low Complexity: 1 Low  Kathie Dike, M.S.  OTR/L  01/12/23, 4:19 PM  ascom [phone]

## 2023-01-12 NOTE — Plan of Care (Signed)
  Problem: Health Behavior/Discharge Planning: Goal: Ability to manage health-related needs will improve Outcome: Progressing   Problem: Clinical Measurements: Goal: Ability to maintain clinical measurements within normal limits will improve Outcome: Progressing   

## 2023-01-12 NOTE — Progress Notes (Signed)
[facility]  ROUNDING NOTE   Subjective:   Patient seen laying in bed Alert and oriented Husband at bedside Upper extremity edema resolved Lower extremity edema improved.    Objective:  Vital signs in last 24 hours:  Temp:  [97.4 F (36.3 C)-98.7 F (37.1 C)] 97.4 F (36.3 C) (08/07 1140) Pulse Rate:  [80-98] 91 (08/07 1140) Resp:  [18-20] 18 (08/07 1140) BP: (131-165)/(52-82) 131/62 (08/07 1140) SpO2:  [99 %-100 %] 100 % (08/07 1140)  Weight change:  Filed Weights   01/10/23 1856  Weight: 85.7 kg    Intake/Output: I/O last 3 completed shifts: In: 240 [P.O.:240] Out: 3050 [Urine:3050]   Intake/Output this shift:  Total I/O In: -  Out: 800 [Urine:800]  Physical Exam: General: NAD  Head: Normocephalic, atraumatic. Moist oral mucosal membranes  Eyes: Anicteric  Lungs:  Clear to auscultation, normal effort  Heart: Regular rate and rhythm  Abdomen:  Soft, nontender  Extremities:  1+ peripheral edema.  Neurologic: Alert and oriented, moving all four extremities  Skin: No lesions  Access: None    Basic Metabolic Panel: Recent Labs  Lab 01/06/23 0058 01/06/23 1027 01/10/23 1858 01/11/23 0227 01/11/23 0435 01/11/23 1211 01/11/23 1601 01/11/23 2057 01/12/23 0004 01/12/23 0355 01/12/23 0811  NA 132* 131* 122* 126*   < > 129* 129* 129* 130* 132* 133*  K 4.1 4.5 3.9  --   --  4.2  --   --   --   --   --   CL 100 98 92*  --   --   --   --   --   --   --   --   CO2 22 23 20*  --   --   --   --   --   --   --   --   GLUCOSE 200* 198* 179*  --   --   --   --   --   --   --   --   BUN 20 23* 26*  --   --   --   --   --   --   --   --   CREATININE 1.09* 1.11* 1.25* 1.35*  --   --   --   --   --   --  1.52*  CALCIUM 8.3* 8.1* 8.0*  --   --   --   --   --   --   --   --   MG  --   --   --   --   --  2.0  --   --   --   --   --    < > = values in this interval not displayed.    Liver Function Tests: Recent Labs  Lab 01/06/23 0058  AST 22  ALT  16  ALKPHOS 72  BILITOT 0.4  PROT 6.8  ALBUMIN 3.5   No results for input(s): "LIPASE", "AMYLASE" in the last 168 hours. No results for input(s): "AMMONIA" in the last 168 hours.  CBC: Recent Labs  Lab 01/06/23 0058 01/06/23 1027 01/10/23 1858 01/11/23 0227 01/12/23 0355  WBC 10.6* 8.5 8.2 7.6 6.8  NEUTROABS  --  6.5  --   --  4.0  HGB 10.9* 10.1* 10.8* 10.3* 10.6*  HCT 32.3* 29.8* 32.7* 30.5* 31.3*  MCV 92.6 90.9 92.1 90.8 90.5  PLT 307 268 149* 271 304    Cardiac Enzymes: No results for input(s): "  CKTOTAL", "CKMB", "CKMBINDEX", "TROPONINI" in the last 168 hours.  BNP: Invalid input(s): "POCBNP"  CBG: Recent Labs  Lab 01/11/23 1806 01/11/23 1858 01/11/23 2105 01/12/23 0741 01/12/23 1142  GLUCAP 127* 157* 159* 101* 197*    Microbiology: Results for orders placed or performed during the hospital encounter of 09/30/22  Urine Culture     Status: Abnormal   Collection Time: 09/30/22  6:46 AM   Specimen: Urine, Clean Catch  Result Value Ref Range Status   Specimen Description   Final    URINE, CLEAN CATCH Performed at [facility], [address], [place], [place] [address]    Special Requests   Final    NONE Performed at [facility], [address] [place], [place] [address]    Culture >=100,000 COLONIES/mL KLEBSIELLA PNEUMONIAE (A)  Final   Report Status 10/02/2022 FINAL  Final   Organism ID, Bacteria KLEBSIELLA PNEUMONIAE (A)  Final      Susceptibility   Klebsiella pneumoniae - MIC*    AMPICILLIN >=32 RESISTANT Resistant     CEFAZOLIN <=4 SENSITIVE Sensitive     CEFEPIME <=0.12 SENSITIVE Sensitive     CEFTRIAXONE <=0.25 SENSITIVE Sensitive     CIPROFLOXACIN <=0.25 SENSITIVE Sensitive     GENTAMICIN <=1 SENSITIVE Sensitive     IMIPENEM <=0.25 SENSITIVE Sensitive     NITROFURANTOIN 64 INTERMEDIATE Intermediate     TRIMETH/SULFA <=20 SENSITIVE Sensitive     AMPICILLIN/SULBACTAM 8 SENSITIVE Sensitive     PIP/TAZO <=4 SENSITIVE  Sensitive     * >=100,000 COLONIES/mL KLEBSIELLA PNEUMONIAE    Coagulation Studies: No results for input(s): "LABPROT", "INR" in the last 72 hours.  Urinalysis: No results for input(s): "COLORURINE", "LABSPEC", "PHURINE", "GLUCOSEU", "HGBUR", "BILIRUBINUR", "KETONESUR", "PROTEINUR", "UROBILINOGEN", "NITRITE", "LEUKOCYTESUR" in the last 72 hours.  Invalid input(s): "APPERANCEUR"    Imaging: ECHOCARDIOGRAM LIMITED  Result Date: 01/12/2023    ECHOCARDIOGRAM LIMITED REPORT   Patient Name:   Deanna Joseph Date of Exam: 01/11/2023 Medical Rec #:  [id number]       Height:       60.0 in Accession #:    [id number]      Weight:       189.0 lb Date of Birth:  Dec 11, 1965      BSA:          1.822 m Patient Age:    56 years        BP:           147/77 mmHg Patient Gender: F               HR:           76 bpm. Exam Location:  [facility] Procedure: 2D Echo, Limited Echo, Limited Color Doppler and Cardiac Doppler Indications:     I50.31 Acute Diastolic CHF  History:         Patient has prior history of Echocardiogram examinations, most                  recent 10/01/2022. Risk Factors:Diabetes.  Sonographer:     Daphine Deutscher RDCS Referring Phys:  [id number] [facility] MICHELLE TANG Diagnosing Phys: Rozell Searing Custovic IMPRESSIONS  1. Left ventricular ejection fraction, by estimation, is 60 to 65%. Left ventricular ejection fraction by PLAX is 63 %. The left ventricle has normal function. The left ventricle has no regional wall motion abnormalities. Left ventricular diastolic parameters are consistent with Grade I diastolic dysfunction (impaired relaxation).  2. Right ventricular systolic function is normal.  The right ventricular size is normal.  3. The mitral valve is grossly normal. No evidence of mitral valve regurgitation.  4. The aortic valve is grossly normal. Aortic valve regurgitation is mild to moderate. Aortic regurgitation PHT measures 372 msec. FINDINGS  Left Ventricle: Left ventricular ejection fraction, by  estimation, is 60 to 65%. Left ventricular ejection fraction by PLAX is 63 %. The left ventricle has normal function. The left ventricle has no regional wall motion abnormalities. The left ventricular internal cavity size was normal in size. There is no left ventricular hypertrophy. Left ventricular diastolic parameters are consistent with Grade I diastolic dysfunction (impaired relaxation). Right Ventricle: The right ventricular size is normal. No increase in right ventricular wall thickness. Right ventricular systolic function is normal. Left Atrium: Left atrial size was normal in size. Right Atrium: Right atrial size was normal in size. Pericardium: There is no evidence of pericardial effusion. Mitral Valve: The mitral valve is grossly normal. Tricuspid Valve: The tricuspid valve is grossly normal. Tricuspid valve regurgitation is trivial. Aortic Valve: The aortic valve is grossly normal. Aortic valve regurgitation is mild to moderate. Aortic regurgitation PHT measures 372 msec. Pulmonic Valve: The pulmonic valve was grossly normal. Pulmonic valve regurgitation is not visualized. Aorta: The aortic root is normal in size and structure. IAS/Shunts: No atrial level shunt detected by color flow Doppler. LEFT VENTRICLE PLAX 2D LV EF:         Left            Diastology                ventricular     LV e' medial:    7.18 cm/s                ejection        LV E/e' medial:  11.7                fraction by     LV e' lateral:   11.75 cm/s                PLAX is 63      LV E/e' lateral: 7.1                %. LVIDd:         3.90 cm LVIDs:         2.60 cm LV PW:         0.60 cm LV IVS:        0.60 cm  RIGHT VENTRICLE RV S prime:     11.40 cm/s TAPSE (M-mode): 2.4 cm LEFT ATRIUM         Index LA diam:    3.30 cm 1.81 cm/m  AORTIC VALVE AI PHT:      372 msec  AORTA Ao Root diam: 3.10 cm MITRAL VALVE MV Area (PHT): 3.81 cm MV Decel Time: 199 msec MV E velocity: 83.85 cm/s MV A velocity: 100.50 cm/s MV E/A ratio:  0.83 Sabina  Custovic Electronically signed by Clotilde Dieter Signature Date/Time: 01/12/2023/9:20:19 AM    Final    CT HEAD WO CONTRAST ( )  Result Date: 01/11/2023 CLINICAL DATA:  Altered mental status EXAM: CT HEAD WITHOUT CONTRAST TECHNIQUE: Contiguous axial images were obtained from the base of the skull through the vertex without intravenous contrast. RADIATION DOSE REDUCTION: This exam was performed according to the departmental dose-optimization program which includes automated exposure control, adjustment of the mA and/or kV according to patient size and/or use  of iterative reconstruction technique. COMPARISON:  10/24/2022 FINDINGS: Brain: No acute intracranial abnormality. Specifically, no hemorrhage, hydrocephalus, mass lesion, acute infarction, or significant intracranial injury. Vascular: No hyperdense vessel or unexpected calcification. Skull: No acute calvarial abnormality. Sinuses/Orbits: No acute findings Other: None IMPRESSION: No acute intracranial abnormality. Electronically Signed   By: Charlett Nose M.D.   On: 01/11/2023 00:04   DG Chest 2 View  Result Date: 01/10/2023 CLINICAL DATA:  Shortness of breath EXAM: CHEST - 2 VIEW COMPARISON:  Chest x-ray 01/06/2023 FINDINGS: The heart size and mediastinal contours are within normal limits. Both lungs are clear. Chronic left humeral neck fracture again noted. IMPRESSION: No active cardiopulmonary disease. Electronically Signed   By: Darliss Cheney M.D.   On: 01/10/2023 19:58     Medications:     feeding supplement  237 mL Oral BID BM   [START ON 01/13/2023] furosemide  40 mg Oral Daily   insulin aspart  0-15 Units Subcutaneous TID WC   insulin aspart  0-5 Units Subcutaneous QHS   metoprolol succinate  12.5 mg Oral Daily   traZODone  50 mg Oral QHS   acetaminophen **OR** acetaminophen, haloperidol, ondansetron **OR** ondansetron (ZOFRAN) IV, oxyCODONE  Assessment/ Plan:  Ms. RAGINE BALDER is a 57 y.o.  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  Sodium on admission 122.  Briefly considered 3% hypertonic saline.  Patient given IV furosemide with desired response.  Sodium 133 today, continue Furosemide at prescribed dose. Would recommend increasing home dose of furosemide to 40mg  daily at discharge and have patient follow up with primary care.    2.  Chronic kidney disease stage IIIa.  Stable  Not currently followed by nephrology. Will scheduled an appt in our office at discharge.   3. Anemia of chronic kidney disease Lab Results  Component Value Date   HGB 10.6 (L) 01/12/2023    Hgb stable. No indication needed for ESA  4. 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 ost recent hemoglobin A1c is 7.9 on 09/30/22.     LOS: 1   8/7/202412:16 PM

## 2023-01-12 NOTE — Evaluation (Signed)
Physical Therapy Evaluation Patient Details Name: ALYXANDRIA TRICHEL MRN: [id number] DOB: 09-Jan-1966 Today's Date: 01/12/2023  History of Present Illness  Pt is a 57 y/o female presenting to hospital with dyspnea, worsening LE edema and pain. Admitted to hospital for acute metabolic encephalopathy, LE swelling, and acute on chronic CHF. PMH includes: nonalcoholic Wernicke's Korsakoff syndrome, T2DM, CKD stage 3, and G1DD.  Clinical Impression  Pt was pleasant and motivated to participate during the session and put forth good effort throughout. Pt found supine in bed with family present. Initial session readings of 97% SpO2 on room air and HR 88 bpm. Pt able to perform supine to sit with Min A for LE's. Transfers and steps taken at bedside with RW requiring CGA throughout. STS from multiple heights and surfaces bed/BSC. Pt presenting close to baseline but continues to be limited by apprehension  and pain despite medications. Pt will benefit from continued PT services upon discharge to safely address deficits listed in patient problem list for decreased caregiver assistance and eventual return to PLOF.          If plan is discharge home, recommend the following: A little help with walking and/or transfers;A little help with bathing/dressing/bathroom;Assistance with cooking/housework;Assist for transportation   Can travel by private vehicle        Equipment Recommendations None recommended by PT  Recommendations for Other Services       Functional Status Assessment Patient has had a recent decline in their functional status and/or demonstrates limited ability to make significant improvements in function in a reasonable and predictable amount of time     Precautions / Restrictions Precautions Precautions: Fall Restrictions Weight Bearing Restrictions: No      Mobility  Bed Mobility Overal bed mobility: Needs Assistance Bed Mobility: Supine to Sit, Sit to Supine     Supine to sit: Min  assist Sit to supine: Min assist   General bed mobility comments: Slow, given min assist to help with LE's    Transfers Overall transfer level: Needs assistance Equipment used: Rolling walker (2 wheels) Transfers: Sit to/from Stand Sit to Stand: Contact guard assist           General transfer comment: Slowed, but able to follow VC's for sequencing and hand placement    Ambulation/Gait Ambulation/Gait assistance: Contact guard assist Gait Distance (Feet): 10 Feet Assistive device: Rolling walker (2 wheels) Gait Pattern/deviations: Decreased step length - right, Decreased step length - left, Decreased stride length, Shuffle Gait velocity: decreased     General Gait Details: Taken steps forwards and backwards, side stepping along bedside with RW. Cues given for controlled movement throughout.  Stairs            Wheelchair Mobility     Tilt Bed    Modified Rankin (Stroke Patients Only)       Balance Overall balance assessment: Needs assistance Sitting-balance support: Feet supported, No upper extremity supported Sitting balance-Leahy Scale: Fair     Standing balance support: Reliant on assistive device for balance, During functional activity, Bilateral upper extremity supported Standing balance-Leahy Scale: Fair Standing balance comment: During static and standing functional toileting at [facility]                             Pertinent Vitals/Pain Pain Assessment Pain Assessment: 0-10 Pain Score: 7  Pain Location: Pt reports vague global pain, mainly in LE's. Also reports feeling shakey. Pain Descriptors / Indicators: Sore Pain Intervention(s): Monitored  during session, Limited activity within patient's tolerance    Home Living Family/patient expects to be discharged to:: Private residence Living Arrangements: Spouse/significant other Available Help at Discharge: Family;Available 24/7 Type of Home: House Home Access: Level entry       Home  Layout: One level Home Equipment: Rollator (4 wheels);Cane - single point;Shower seat      Prior Function Prior Level of Function : Needs assist;History of Falls (last six months)             Mobility Comments: Needs assist to walk limited community distances with Rollator. Uses motorized shopping carts as needed in stores. ADLs Comments: Spouse assists when needed with most ADL's depending on the day, Independent with toileting     Extremity/Trunk Assessment   Upper Extremity Assessment Upper Extremity Assessment: Overall WFL for tasks assessed LUE Deficits / Details: per spouse pt was cleared from sling ~2 weeks ago    Lower Extremity Assessment Lower Extremity Assessment: Generalized weakness       Communication   Communication Communication: No apparent difficulties  Cognition Arousal: Alert Behavior During Therapy: WFL for tasks assessed/performed Overall Cognitive Status: History of cognitive impairments - at baseline                                 General Comments: Had mild dementia        General Comments      Exercises Other Exercises Other Exercises: Side stepping length of bed x4 with RW, CGA Other Exercises: Forwards and backwards stepping x2 with RW, CGA   Assessment/Plan    PT Assessment Patient needs continued PT services  PT Problem List Decreased strength;Decreased activity tolerance;Decreased balance;Decreased mobility;Pain       PT Treatment Interventions DME instruction;Gait training;Balance training;Functional mobility training;Therapeutic activities;Therapeutic exercise;Patient/family education    PT Goals (Current goals can be found in the Care Plan section)  Acute Rehab PT Goals Patient Stated Goal: be able to walk better PT Goal Formulation: With patient Time For Goal Achievement: 01/25/23 Potential to Achieve Goals: Fair    Frequency Min 1X/week     Co-evaluation               AM-PAC PT "6 Clicks"  Mobility  Outcome Measure Help needed turning from your back to your side while in a flat bed without using bedrails?: A Little Help needed moving from lying on your back to sitting on the side of a flat bed without using bedrails?: A Little Help needed moving to and from a bed to a chair (including a wheelchair)?: A Little Help needed standing up from a chair using your arms (e.g., wheelchair or bedside chair)?: A Little Help needed to walk in hospital room?: A Little Help needed climbing 3-5 steps with a railing? : A Little 6 Click Score: 18    End of Session Equipment Utilized During Treatment: Gait belt Activity Tolerance: Patient tolerated treatment well Patient left: in bed;with call bell/phone within reach;with bed alarm set;with family/visitor present Nurse Communication: Mobility status PT Visit Diagnosis: Pain Pain - part of body:  (Pt reporting global pain in Bilateral UE and LEs)    Time: 1325-1409 PT Time Calculation (min) (ACUTE ONLY): 44 min   Charges:                 Cecile Sheerer, SPT 01/12/23, 4:38 PM

## 2023-01-13 DIAGNOSIS — E871 Hypo-osmolality and hyponatremia: Secondary | ICD-10-CM | POA: Diagnosis not present

## 2023-01-13 LAB — GLUCOSE, CAPILLARY
Glucose-Capillary: 136 mg/dL — ABNORMAL HIGH (ref 70–99)
Glucose-Capillary: 163 mg/dL — ABNORMAL HIGH (ref 70–99)
Glucose-Capillary: 152 mg/dL — ABNORMAL HIGH (ref 70–99)
Glucose-Capillary: 172 mg/dL — ABNORMAL HIGH (ref 70–99)

## 2023-01-13 MED ORDER — SODIUM CHLORIDE 1 G PO TABS
1.0000 g | ORAL_TABLET | Freq: Three times a day (TID) | ORAL | Status: DC
  Administered 2023-01-13 – 2023-01-15 (×6): 1 g via ORAL
  Filled 2023-01-13 (×6): qty 1

## 2023-01-13 NOTE — Plan of Care (Signed)

## 2023-01-13 NOTE — Progress Notes (Signed)
Occupational Therapy Treatment Patient Details Name: Deanna Joseph MRN: [id number] DOB: [birth year] Today's Date: 01/13/2023   History of present illness Pt is a 57 y/o female presenting to hospital with dyspnea, worsening LE edema and pain. Admitted to hospital for acute metabolic encephalopathy, LE swelling, and acute on chronic CHF. PMH includes: nonalcoholic Wernicke's Korsakoff syndrome, T2DM, CKD stage 3, and G1DD.   OT comments  Pt seen for OT treatment on this date. Upon arrival to room pt supine in bed, agreeable to OT Tx session with gentle encouragement. OT facilitated ADL management as described below. See ADL section for additional details regarding occupational performance. Pt continues to be functionally limited by decreased balance, decreased cognition, and decreased activity tolerance. She requires SUPERVISION for safety during bed/functional mobility with step-by step cues t/o session. Pt is progressing toward OT goals and continues to benefit from skilled OT services to maximize return to PLOF and minimize risk of future falls, injury, caregiver burden, and readmission. Will continue to follow POC as written. Discharge recommendation remains appropriate.        If plan is discharge home, recommend the following:  Supervision due to cognitive status;A little help with bathing/dressing/bathroom;Assist for transportation;Assistance with cooking/housework;A little help with walking and/or transfers   Equipment Recommendations  BSC/3in1    Recommendations for Other Services      Precautions / Restrictions Precautions Precautions: Fall Restrictions Weight Bearing Restrictions: No       Mobility Bed Mobility Overal bed mobility: Needs Assistance Bed Mobility: Supine to Sit     Supine to sit: Supervision, HOB elevated     General bed mobility comments: Increased time/effort to perform but no physical assist required.    Transfers Overall transfer level: Needs  assistance Equipment used: Rolling walker (2 wheels) Transfers: Sit to/from Stand, Bed to chair/wheelchair/BSC Sit to Stand: Contact guard assist     Step pivot transfers: Contact guard assist     General transfer comment: Slowed, but able to follow VC's for sequencing and hand placement     Balance Overall balance assessment: Needs assistance Sitting-balance support: Feet supported, No upper extremity supported Sitting balance-Leahy Scale: Fair     Standing balance support: Reliant on assistive device for balance, During functional activity, Bilateral upper extremity supported Standing balance-Leahy Scale: Fair Standing balance comment: During static and standing functional toileting at [facility]                           ADL either performed or assessed with clinical judgement   ADL Overall ADL's : Needs assistance/impaired Eating/Feeding: Set up;Sitting   Grooming: Wash/dry face;Set up;Supervision/safety;Cueing for sequencing Grooming Details (indicate cue type and reason): Requires step by step cueing for initiation and completion of task. Frequently asks "Is this for my face?" when handed washcloth                             Functional mobility during ADLs: Supervision/safety;Rolling walker (2 wheels);Cueing for sequencing;Cueing for safety      Extremity/Trunk Assessment              Vision Patient Visual Report: No change from baseline     Perception     Praxis      Cognition Arousal: Alert Behavior During Therapy: WFL for tasks assessed/performed Overall Cognitive Status: History of cognitive impairments - at baseline  General Comments: Baselin STM deficits, requires consistent cueing and re-direction to task/topic at hand        Exercises Other Exercises Other Exercises: OT facilitated ADL management with assist as described above. Education on safety, falls prevention and compensatory  ADL management strategies provided t/o session. Education appears somewhat limited by baseline cognitive deficits.    Shoulder Instructions       General Comments      Pertinent Vitals/ Pain       Pain Assessment Pain Assessment: No/denies pain  Home Living                                          Prior Functioning/Environment              Frequency  Min 1X/week        Progress Toward Goals  OT Goals(current goals can now be found in the care plan section)  Progress towards OT goals: Progressing toward goals  Acute Rehab OT Goals Patient Stated Goal: to go home OT Goal Formulation: With patient/family Time For Goal Achievement: 01/26/23 Potential to Achieve Goals: Good  Plan      Co-evaluation                 AM-PAC OT "6 Clicks" Daily Activity     Outcome Measure   Help from another person eating meals?: None Help from another person taking care of personal grooming?: A Little Help from another person toileting, which includes using toliet, bedpan, or urinal?: A Little Help from another person bathing (including washing, rinsing, drying)?: A Little Help from another person to put on and taking off regular upper body clothing?: None Help from another person to put on and taking off regular lower body clothing?: A Little 6 Click Score: 20    End of Session Equipment Utilized During Treatment: Gait belt;Rolling walker (2 wheels)  OT Visit Diagnosis: Unsteadiness on feet (R26.81);Muscle weakness (generalized) (M62.81)   Activity Tolerance Patient tolerated treatment well   Patient Left with family/visitor present;in chair;with chair alarm set;with call bell/phone within reach   Nurse Communication Mobility status        Time: 4098-1191 OT Time Calculation (min): 26 min  Charges: OT General Charges $OT Visit: 1 Visit OT Treatments $Self Care/Home Management : 23-37 mins  Rockney Ghee, M.S., OTR/L 01/13/23, 12:43  PM

## 2023-01-13 NOTE — TOC Initial Note (Signed)
Transition of Care [facility]) - Initial/Assessment Note    Patient Details  Name: Deanna Joseph MRN: [id number] Date of Birth: October 04, 1965  Transition of Care [facility]) CM/SW Contact:    Darolyn Rua, LCSW Phone Number: 01/13/2023, 3:28 PM  Clinical Narrative:                  Patient from home with husband, presented to ED with leg swelling. PCP Hillery Aldo, MD.   Patient recommended for [facility] PT and OT, CSW spoke with patient and spouse. Spouse reports they had [facility] in the past and would prefer them, CSW has reached out to [place] with [facility] who reports they will try to take her back, they request to be updated when patient discharges.   Patient has a cane and rollator at home, declined 3in1. No dme needs.   Will need HH PT and OT orders at dc and update [place] with [facility].   TOC will continue to follow for discharge planning needs.   Expected Discharge Plan: Home w Home Health Services Barriers to Discharge: Continued Medical Work up   Patient Goals and CMS Choice Patient states their goals for this hospitalization and ongoing recovery are:: to go home CMS Medicare.gov Compare Post Acute Care list provided to:: Patient Choice offered to / list presented to : Patient      Expected Discharge Plan and Services       Living arrangements for the past 2 months: Single Family Home                           HH Arranged: OT, PT HH Agency: [facility] Date [facility] Agency Contacted: 01/13/23 Time HH Agency Contacted: 1527 Representative spoke with at [facility] Agency: [place]  Prior Living Arrangements/Services Living arrangements for the past 2 months: Single Family Home Lives with:: Spouse   Do you feel safe going back to the place where you live?: Yes               Activities of Daily Living Home Assistive Devices/Equipment: None ADL Screening (condition at time of admission) Patient's cognitive ability adequate to safely complete daily activities?:  No Is the patient deaf or have difficulty hearing?: No Does the patient have difficulty seeing, even when wearing glasses/contacts?: No Does the patient have difficulty concentrating, remembering, or making decisions?: Yes Patient able to express need for assistance with ADLs?: No Does the patient have difficulty dressing or bathing?: Yes Independently performs ADLs?: No Communication: Independent with device (comment) Dressing (OT): Needs assistance Grooming: Needs assistance Bathing: Needs assistance Toileting: Needs assistance In/Out Bed: Needs assistance Walks in Home: Needs assistance Does the patient have difficulty walking or climbing stairs?: Yes Weakness of Legs: Both Weakness of Arms/Hands: None  Permission Sought/Granted                  Emotional Assessment              Admission diagnosis:  Acute hyponatremia [E87.1] Hyponatremia [E87.1] Bilateral lower extremity edema [R60.0] Patient Active Problem List   Diagnosis Date Noted   Acute hyponatremia 01/11/2023   Acute metabolic encephalopathy 01/11/2023   Bilateral lower extremity edema 01/11/2023   Acute on chronic diastolic CHF (congestive heart failure) (HCC) 01/11/2023   Cognitive deficits 01/11/2023   Contusion of right hip 10/01/2022   Syncope 09/30/2022   UTI (urinary tract infection) 09/30/2022   Humerus fracture 09/30/2022   Wernicke-Korsakoff syndrome, nonalcoholic (HCC) 08/07/2019  Protein-calorie malnutrition, severe 07/24/2019   Nausea    Ptosis, left eyelid    Acute gastritis without hemorrhage    Weakness    Type 2 diabetes mellitus with hyperlipidemia (HCC)    Depression    Hypomagnesemia    Gastroparesis 07/18/2019   Intractable vomiting with nausea 07/17/2019   Unintentional weight loss of more than 10 pounds in 90 days 07/17/2019   History of chronic gastritis 07/17/2019   History of Helicobacter pylori infection 07/17/2019   Diet-controlled diabetes mellitus (HCC)  07/17/2019   AKI (acute kidney injury) (HCC) 07/17/2019   Metabolic acidosis 07/17/2019   Hypokalemia 07/17/2019   Hypotension 07/17/2019   PCP:  Hillery Aldo, MD Pharmacy:   [facility] - [place], [place] - [address] [place] [address] Phone: [phone] Fax: [phone]     Social Determinants of Health (SDOH) Social History: SDOH Screenings   Food Insecurity: No Food Insecurity (01/11/2023)  Housing: Low Risk  (01/11/2023)  Transportation Needs: No Transportation Needs (01/11/2023)  Utilities: Not At Risk (01/11/2023)  Financial Resource Strain: Low Risk  (02/26/2020)   [facility]  Recent Concern: Financial Resource Strain - Medium Risk (01/13/2020)   Received from [facility]  Tobacco Use: Low Risk  (01/10/2023)   SDOH Interventions:     Readmission Risk Interventions     No data to display

## 2023-01-13 NOTE — Progress Notes (Addendum)
Physical Therapy Treatment Patient Details Name: Deanna Joseph MRN: [id number] DOB: [birth year] Today's Date: 01/13/2023   History of Present Illness Pt is a 57 y/o female presenting to hospital with dyspnea, worsening LE edema and pain. Admitted to hospital for acute metabolic encephalopathy, LE swelling, and acute on chronic CHF. PMH includes: nonalcoholic Wernicke's Korsakoff syndrome, T2DM, CKD stage 3, and G1DD.    PT Comments  Pt was pleasant and motivated to participate during the session and put forth good effort throughout. SpO2 readings throughout session kept in high 90's on room air, and HR maintained within functional ranges appropriate for activity. Pt performed STS from recliner to RW with CGA for safety. Pt preformed 250 feet bout of ambulation with initial steps slowed and segmented but with verbal cues and encouragement pt able to show improvements in hallway. Once out of room pt appearing steady with steady strides; Although slow, pt and spouse report she is walking close to baseline. Pt able to hold conversational speech throughout rating the walk 4-5/10 level of difficulty. Pt will benefit from continued PT services upon discharge to safely address deficits listed in patient problem list for decreased caregiver assistance and eventual return to PLOF.       If plan is discharge home, recommend the following: A little help with walking and/or transfers;A little help with bathing/dressing/bathroom;Assistance with cooking/housework;Assist for transportation   Can travel by private vehicle        Equipment Recommendations  None recommended by PT    Recommendations for Other Services       Precautions / Restrictions Precautions Precautions: Fall Restrictions Weight Bearing Restrictions: No     Mobility  Bed Mobility Overal bed mobility:  (N/T, pt found seated in recliner)                  Transfers Overall transfer level: Needs assistance Equipment used:  Rolling walker (2 wheels)   Sit to Stand: Contact guard assist           General transfer comment: Slow, showing improvements in transfer speed in comparison to previous session.    Ambulation/Gait Ambulation/Gait assistance: Contact guard assist Gait Distance (Feet): 250 Feet Assistive device: Rolling walker (2 wheels) Gait Pattern/deviations: Step-through pattern Gait velocity: decreased     General Gait Details: Performed with chair follow for safety. Slowed first few steps, once in hallway pt picking up steady cadence. Pt and spouse reports appearing close/at baseline with general ambulation   Stairs             Wheelchair Mobility     Tilt Bed    Modified Rankin (Stroke Patients Only)       Balance Overall balance assessment: Needs assistance Sitting-balance support: Feet supported, No upper extremity supported Sitting balance-Leahy Scale: Good     Standing balance support: Reliant on assistive device for balance, During functional activity, Bilateral upper extremity supported Standing balance-Leahy Scale: Fair Standing balance comment: During static and functional ambulation                            Cognition Arousal: Alert Behavior During Therapy: WFL for tasks assessed/performed Overall Cognitive Status: History of cognitive impairments - at baseline                                 General Comments: Slight redirections required to maintain task appropriate behavior  Exercises      General Comments        Pertinent Vitals/Pain Pain Assessment Pain Assessment: Faces Faces Pain Scale: Hurts a little bit Pain Location: generalized Pain Descriptors / Indicators: Discomfort Pain Intervention(s): Monitored during session    Home Living                          Prior Function            PT Goals (current goals can now be found in the care plan section) Progress towards PT goals: Progressing  toward goals    Frequency    Min 1X/week      PT Plan      Co-evaluation              AM-PAC PT "6 Clicks" Mobility   Outcome Measure  Help needed turning from your back to your side while in a flat bed without using bedrails?: A Little Help needed moving from lying on your back to sitting on the side of a flat bed without using bedrails?: A Little Help needed moving to and from a bed to a chair (including a wheelchair)?: A Little Help needed standing up from a chair using your arms (e.g., wheelchair or bedside chair)?: A Little Help needed to walk in hospital room?: A Little Help needed climbing 3-5 steps with a railing? : A Little 6 Click Score: 18    End of Session Equipment Utilized During Treatment: Gait belt Activity Tolerance: Patient tolerated treatment well Patient left: in chair;with call bell/phone within reach;with chair alarm set;with family/visitor present Nurse Communication: Mobility status PT Visit Diagnosis: Pain     Time: 1610-9604 PT Time Calculation (min) (ACUTE ONLY): 19 min  Charges:                            Cecile Sheerer, SPT 01/13/23, 3:03 PM This entire session was performed under direct supervision and direction of a licensed therapist/therapist assistant. I have personally read, edited and approve of the note as written.  Loran Senters, DPT

## 2023-01-13 NOTE — Progress Notes (Signed)
Progress Note   [name] ZOX:[id number] DOB: Sep 18, 1965 DOA: 01/10/2023     2 DOS: the patient was seen and examined on 01/13/2023    Brief hospital course: From HPI "Deanna Joseph is a 57 y.o. 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  At her first visit on 8/1 she had a CTA chest that was negative for PE and she was diuresed and discharged.  Her husband brought her in because of worsening lower extremity edema aching pain and increased confusion from her baseline described as increased repetitiveness, asking the same question over and over again.  She has been compliant with the Lasix and she has had intermittent chest discomfort.  He states for the last couple weeks, she has been eating every 2 hours and drinking a lot of water. ED course and data review: Vitals unremarkable.  Labs notable forB NP of 198, up from 131 5 days prior and sodium of 122, down from 131 5 days ago.  Creatinine was increased to 1.25 from her baseline around 1.09.  Bicarb 20.  Hemoglobin at baseline at 10.8, troponin 9.  Serum osmolality 281. EKG, personally viewed and interpreted showing sinus at 96 with LVH and nonspecific ST-T wave changes. CT head nonacute Chest x-ray nonacute Patient given a dose of Lasix 60 mg IV Hospitalist consulted for admission "   Further hospital course and management as outlined below.    Subjective:  Patient seen and examined at bedside this morning. She is tearful asking if she can eat something.  She continues to report hunger pains despite eating her meals.  Husband not present on rounds today, was yesterday & reported she also does this at home regularly.  Pt perseverates on this and is not redirectable.     Assessment and Plan: Acute hyponatremia likely in the setting of volume  overload-improving Acute metabolic encephalopathy- due to hyponatremia, now resolved, mentation at baseline Patient more confused than baseline and with sodium 122 down from 131 a few days prior Pt was diuresed with IV Lasix and sodium improved to 133 Na today 133 >> 126 Fluid restriction 1200 cc/day Resumed on home Lasix 40 mg PO daily Daily BMP's to monitor Nephrology following - appreciate recommendations   Acute on chronic diastolic CHF (congestive heart failure) (HCC) Grade 1 diastolic dysfunction (EF 60 to 65% 09/2022) Patient was seen by cardiology on 8/1 BNP just over 100 and chest x-ray clear but patient fluid overloaded Treated with IV Lasix 40 mg BID -- last dose to be given this AM Now resumed on PO Lasix 40 mg daily Started on metoprolol 12.5 mg daily  Monitor electrolytes and renal function closely  Daily weights with intake and output monitoring Echocardiogram - EF 60-65%, no LV WMA's, grade I DD Cardiology following - appreciate recommendations    Type 2 diabetes mellitus with hyperlipidemia (HCC) Last A1c 7.9% on 09/30/22, poorly controlled. Sliding scale insulin AC/HS Monitor glucose closely    AKI superimposed on CKD stage IIIa Metabolic acidosis Continue management of heart failure as above Monitor renal function closely Renally dose all drugs Nephrology following -- to arrange outpatient follow up     DVT prophylaxis: Lovenox    Consults: nephrology, cardiology    Advance Care Planning:   Code Status: Full code    Family Communication: husband  at bedside on rounds 8/7, not present today, will attempt to call.    Disposition Plan: Home with [facility] PT    Physical Exam: General exam: awake, alert, no acute distress, tearful complaining of hunger HEENT: moist mucus membranes, hearing grossly normal  Respiratory system: CTAB, no wheezes, rales or rhonchi, normal respiratory effort. Cardiovascular system: normal S1/S2, RRR, no edema (resolved).    Gastrointestinal system: soft, NT, ND Central nervous system: no gross focal neurologic deficits, normal speech Extremities: moves all, no edema, normal tone Skin: dry, intact, normal temperature Psychiatry: anxious mood, congruent affect, abnormal judgement and insight      Vitals:   01/13/23 0337 01/13/23 0747 01/13/23 0824 01/13/23 1223  BP: 129/62  (!) 144/54 136/66  Pulse: 68  77 73  Resp: 17  16 16   Temp: 97.6 F (36.4 C)  97.8 F (36.6 C) (!) 97.5 F (36.4 C)  TempSrc:      SpO2: 96%  98% 100%  Weight:  70.7 kg    Height:        Data Reviewed: Notable labs ---  Na 133 >> 126, Cr 1.52 >> 1.34, Ca 8.4, Cl 92, glucose 130, BUN 41    Author: Pennie Banter, DO 01/13/2023 1:48 PM  For on call review [email or web address].

## 2023-01-13 NOTE — Progress Notes (Signed)
[facility]  ROUNDING NOTE   Subjective:   Patient seen laying in bed Alert and oriented Requesting assistance to bathroom Also requesting more water.   Sodium 126   Objective:  Vital signs in last 24 hours:  Temp:  [97.5 F (36.4 C)-98.7 F (37.1 C)] 97.5 F (36.4 C) (08/08 1223) Pulse Rate:  [68-92] 73 (08/08 1223) Resp:  [16-18] 16 (08/08 1223) BP: (129-151)/(54-68) 136/66 (08/08 1223) SpO2:  [96 %-100 %] 100 % (08/08 1223) Weight:  [70.7 kg] 70.7 kg (08/08 0747)  Weight change:  Filed Weights   01/10/23 1856 01/13/23 0747  Weight: 85.7 kg 70.7 kg    Intake/Output: I/O last 3 completed shifts: In: 480 [P.O.:480] Out: 3100 [Urine:3100]   Intake/Output this shift:  Total I/O In: -  Out: 1050 [Urine:1050]  Physical Exam: General: NAD  Head: Normocephalic, atraumatic. Moist oral mucosal membranes  Eyes: Anicteric  Lungs:  Clear to auscultation, normal effort  Heart: Regular rate and rhythm  Abdomen:  Soft, nontender  Extremities:  1+ peripheral edema.  Neurologic: Alert and oriented, moving all four extremities  Skin: No lesions  Access: None    Basic Metabolic Panel: Recent Labs  Lab 01/10/23 1858 01/11/23 0227 01/11/23 0435 01/11/23 1211 01/11/23 1601 01/11/23 2057 01/12/23 0004 01/12/23 0355 01/12/23 0811 01/13/23 0523  NA 122* 126*   < > 129*   < > 129* 130* 132* 133* 126*  K 3.9  --   --  4.2  --   --   --   --  4.3 4.3  CL 92*  --   --   --   --   --   --   --   --  92*  CO2 20*  --   --   --   --   --   --   --   --  24  GLUCOSE 179*  --   --   --   --   --   --   --   --  130*  BUN 26*  --   --   --   --   --   --   --   --  41*  CREATININE 1.25* 1.35*  --   --   --   --   --   --  1.52* 1.34*  CALCIUM 8.0*  --   --   --   --   --   --   --   --  8.4*  MG  --   --   --  2.0  --   --   --   --  2.0 2.1   < > = values in this interval not displayed.    Liver Function Tests: No results for input(s): "AST", "ALT",  "ALKPHOS", "BILITOT", "PROT", "ALBUMIN" in the last 168 hours.  No results for input(s): "LIPASE", "AMYLASE" in the last 168 hours. No results for input(s): "AMMONIA" in the last 168 hours.  CBC: Recent Labs  Lab 01/10/23 1858 01/11/23 0227 01/12/23 0355  WBC 8.2 7.6 6.8  NEUTROABS  --   --  4.0  HGB 10.8* 10.3* 10.6*  HCT 32.7* 30.5* 31.3*  MCV 92.1 90.8 90.5  PLT 149* 271 304    Cardiac Enzymes: No results for input(s): "CKTOTAL", "CKMB", "CKMBINDEX", "TROPONINI" in the last 168 hours.  BNP: Invalid input(s): "POCBNP"  CBG: Recent Labs  Lab 01/12/23 1142 01/12/23 1633 01/12/23 1954 01/13/23 0826 01/13/23 1219  GLUCAP  197* 190* 159* 136* 163*    Microbiology: Results for orders placed or performed during the hospital encounter of 09/30/22  Urine Culture     Status: Abnormal   Collection Time: 09/30/22  6:46 AM   Specimen: Urine, Clean Catch  Result Value Ref Range Status   Specimen Description   Final    URINE, CLEAN CATCH Performed at [facility], [address], [place], [place] [address]    Special Requests   Final    NONE Performed at [facility], [address], [place], [place] [address]    Culture >=100,000 COLONIES/mL KLEBSIELLA PNEUMONIAE (A)  Final   Report Status 10/02/2022 FINAL  Final   Organism ID, Bacteria KLEBSIELLA PNEUMONIAE (A)  Final      Susceptibility   Klebsiella pneumoniae - MIC*    AMPICILLIN >=32 RESISTANT Resistant     CEFAZOLIN <=4 SENSITIVE Sensitive     CEFEPIME <=0.12 SENSITIVE Sensitive     CEFTRIAXONE <=0.25 SENSITIVE Sensitive     CIPROFLOXACIN <=0.25 SENSITIVE Sensitive     GENTAMICIN <=1 SENSITIVE Sensitive     IMIPENEM <=0.25 SENSITIVE Sensitive     NITROFURANTOIN 64 INTERMEDIATE Intermediate     TRIMETH/SULFA <=20 SENSITIVE Sensitive     AMPICILLIN/SULBACTAM 8 SENSITIVE Sensitive     PIP/TAZO <=4 SENSITIVE Sensitive     * >=100,000 COLONIES/mL KLEBSIELLA PNEUMONIAE    Coagulation  Studies: No results for input(s): "LABPROT", "INR" in the last 72 hours.  Urinalysis: No results for input(s): "COLORURINE", "LABSPEC", "PHURINE", "GLUCOSEU", "HGBUR", "BILIRUBINUR", "KETONESUR", "PROTEINUR", "UROBILINOGEN", "NITRITE", "LEUKOCYTESUR" in the last 72 hours.  Invalid input(s): "APPERANCEUR"    Imaging: ECHOCARDIOGRAM LIMITED  Result Date: 01/12/2023    ECHOCARDIOGRAM LIMITED REPORT   Patient Name:   [name] Date of Exam: 01/11/2023 Medical Rec #:  [id number]       Height:       60.0 in Accession #:    [id number]      Weight:       189.0 lb Date of Birth:  02/16/1966      BSA:          1.822 m Patient Age:    57 years        BP:           147/77 mmHg Patient Gender: F               HR:           76 bpm. Exam Location:  [facility] Procedure: 2D Echo, Limited Echo, Limited Color Doppler and Cardiac Doppler Indications:     I50.31 Acute Diastolic CHF  History:         Patient has prior history of Echocardiogram examinations, most                  recent 10/01/2022. Risk Factors:Diabetes.  Sonographer:     Daphine Deutscher RDCS Referring Phys:  [id number] [facility] MICHELLE TANG Diagnosing Phys: Rozell Searing Custovic IMPRESSIONS  1. Left ventricular ejection fraction, by estimation, is 60 to 65%. Left ventricular ejection fraction by PLAX is 63 %. The left ventricle has normal function. The left ventricle has no regional wall motion abnormalities. Left ventricular diastolic parameters are consistent with Grade I diastolic dysfunction (impaired relaxation).  2. Right ventricular systolic function is normal. The right ventricular size is normal.  3. The mitral valve is grossly normal. No evidence of mitral valve regurgitation.  4. The aortic valve is grossly normal. Aortic valve regurgitation is  mild to moderate. Aortic regurgitation PHT measures 372 msec. FINDINGS  Left Ventricle: Left ventricular ejection fraction, by estimation, is 60 to 65%. Left ventricular ejection fraction by PLAX is 63 %. The left  ventricle has normal function. The left ventricle has no regional wall motion abnormalities. The left ventricular internal cavity size was normal in size. There is no left ventricular hypertrophy. Left ventricular diastolic parameters are consistent with Grade I diastolic dysfunction (impaired relaxation). Right Ventricle: The right ventricular size is normal. No increase in right ventricular wall thickness. Right ventricular systolic function is normal. Left Atrium: Left atrial size was normal in size. Right Atrium: Right atrial size was normal in size. Pericardium: There is no evidence of pericardial effusion. Mitral Valve: The mitral valve is grossly normal. Tricuspid Valve: The tricuspid valve is grossly normal. Tricuspid valve regurgitation is trivial. Aortic Valve: The aortic valve is grossly normal. Aortic valve regurgitation is mild to moderate. Aortic regurgitation PHT measures 372 msec. Pulmonic Valve: The pulmonic valve was grossly normal. Pulmonic valve regurgitation is not visualized. Aorta: The aortic root is normal in size and structure. IAS/Shunts: No atrial level shunt detected by color flow Doppler. LEFT VENTRICLE PLAX 2D LV EF:         Left            Diastology                ventricular     LV e' medial:    7.18 cm/s                ejection        LV E/e' medial:  11.7                fraction by     LV e' lateral:   11.75 cm/s                PLAX is 63      LV E/e' lateral: 7.1                %. LVIDd:         3.90 cm LVIDs:         2.60 cm LV PW:         0.60 cm LV IVS:        0.60 cm  RIGHT VENTRICLE RV S prime:     11.40 cm/s TAPSE (M-mode): 2.4 cm LEFT ATRIUM         Index LA diam:    3.30 cm 1.81 cm/m  AORTIC VALVE AI PHT:      372 msec  AORTA Ao Root diam: 3.10 cm MITRAL VALVE MV Area (PHT): 3.81 cm MV Decel Time: 199 msec MV E velocity: 83.85 cm/s MV A velocity: 100.50 cm/s MV E/A ratio:  0.83 Sabina Custovic Electronically signed by Clotilde Dieter Signature Date/Time: 01/12/2023/9:20:19  AM    Final      Medications:     feeding supplement (GLUCERNA SHAKE)  237 mL Oral TID BM   furosemide  40 mg Oral Daily   insulin aspart  0-15 Units Subcutaneous TID WC   insulin aspart  0-5 Units Subcutaneous QHS   metoprolol succinate  12.5 mg Oral Daily   sodium chloride  1 g Oral TID WC   traZODone  100 mg Oral QHS   acetaminophen **OR** acetaminophen, ALPRAZolam, haloperidol, melatonin, ondansetron **OR** ondansetron (ZOFRAN) IV, oxyCODONE  Assessment/ Plan:  Ms. Deanna Joseph is a 57 y.o.  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  Sodium on admission 122.  Briefly considered 3% hypertonic saline.  Patient given IV furosemide with desired response.  Sodium decreased to 126. Ordered fluid restriction, per day. Continue Furosemide as prescribed.    2.  Chronic kidney disease stage IIIa.  Remains stable  Will scheduled an appt in our office at discharge.   3. Anemia of chronic kidney disease Lab Results  Component Value Date   HGB 10.6 (L) 01/12/2023    Hgb at goal. No indication needed for ESA  4. 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 ost recent hemoglobin A1c is 7.9 on 09/30/22.   Glucose well controlled.    LOS: 2   8/8/20241:17 PM

## 2023-01-14 DIAGNOSIS — E871 Hypo-osmolality and hyponatremia: Secondary | ICD-10-CM | POA: Diagnosis not present

## 2023-01-14 DIAGNOSIS — N1831 Chronic kidney disease, stage 3a: Secondary | ICD-10-CM | POA: Diagnosis not present

## 2023-01-14 DIAGNOSIS — D631 Anemia in chronic kidney disease: Secondary | ICD-10-CM | POA: Diagnosis not present

## 2023-01-14 DIAGNOSIS — E1122 Type 2 diabetes mellitus with diabetic chronic kidney disease: Secondary | ICD-10-CM | POA: Diagnosis not present

## 2023-01-14 LAB — GLUCOSE, CAPILLARY
Glucose-Capillary: 136 mg/dL — ABNORMAL HIGH (ref 70–99)
Glucose-Capillary: 208 mg/dL — ABNORMAL HIGH (ref 70–99)
Glucose-Capillary: 142 mg/dL — ABNORMAL HIGH (ref 70–99)
Glucose-Capillary: 155 mg/dL — ABNORMAL HIGH (ref 70–99)

## 2023-01-14 NOTE — Progress Notes (Signed)
Occupational Therapy Treatment Patient Details Name: Deanna Joseph MRN: [id number] DOB: [birth year] Today's Date: 01/14/2023   History of present illness Pt is a 57 y/o female presenting to hospital with dyspnea, worsening LE edema and pain. Admitted to hospital for acute metabolic encephalopathy, LE swelling, and acute on chronic CHF. PMH includes: nonalcoholic Wernicke's Korsakoff syndrome, T2DM, CKD stage 3, and G1DD.   OT comments  Pt seen for OT treatment on this date. Upon arrival to room pt seated in room recliner, agreeable to OT Tx session. She requests assistance to place her lunch/dinner order and requires significant cueing to attend to this IADL task. OT facilitated additional ADL management with education and assist as described below. See ADL section for additional details regarding occupational performance. Pt continues to be functionally limited by decreased balance, decreased cognition, and decreased activity tolerance. She requires CGA for safety during bed/functional mobility with step-by step cues t/o session. Pt is progressing toward OT goals and continues to benefit from skilled OT services to maximize return to PLOF and minimize risk of future falls, injury, caregiver burden, and readmission. Will continue to follow POC as written. Discharge recommendation remains appropriate.        If plan is discharge home, recommend the following:  Supervision due to cognitive status;A little help with bathing/dressing/bathroom;Assist for transportation;Assistance with cooking/housework;A little help with walking and/or transfers   Equipment Recommendations  BSC/3in1    Recommendations for Other Services      Precautions / Restrictions Precautions Precautions: Fall Restrictions Weight Bearing Restrictions: No       Mobility Bed Mobility               General bed mobility comments: NT in recliner at start/end of session.    Transfers Overall transfer level: Needs  assistance Equipment used: Rolling walker (2 wheels)   Sit to Stand: Contact guard assist                 Balance Overall balance assessment: Needs assistance Sitting-balance support: Feet supported, No upper extremity supported Sitting balance-Leahy Scale: Good     Standing balance support: Reliant on assistive device for balance, During functional activity, Bilateral upper extremity supported Standing balance-Leahy Scale: Fair                             ADL either performed or assessed with clinical judgement   ADL Overall ADL's : Needs assistance/impaired                         Toilet Transfer: Contact guard assist;BSC/3in1;Regular Toilet;Rolling walker (2 wheels) Toilet Transfer Details (indicate cue type and reason): Requires increased time to peform but is able to ambulate to room commode (with BSC placed over toilet) from her recliner. Close CGA assist provided t/o as pt reports ongoing dizziness however VSS t/o session. Toileting- Clothing Manipulation and Hygiene: Sitting/lateral lean;Sit to/from stand;Supervision/safety;Set up;Cueing for sequencing;Cueing for safety       Functional mobility during ADLs: Supervision/safety;Rolling walker (2 wheels);Cueing for sequencing;Cueing for safety      Extremity/Trunk Assessment              Vision Patient Visual Report: No change from baseline     Perception     Praxis      Cognition Arousal: Alert Behavior During Therapy: WFL for tasks assessed/performed Overall Cognitive Status: History of cognitive impairments - at baseline  General Comments: Baseline STM deficits, requires consistent cueing and re-direction to task/topic at hand t/o session. Perseverates on getting something to eat/drink but is easily re-directable.        Exercises Other Exercises Other Exercises: OT facilitated ADL management with assist as described above.  Education on safety, falls prevention and compensatory ADL management strategies provided t/o session. Education appears somewhat limited by baseline cognitive deficits.    Shoulder Instructions       General Comments      Pertinent Vitals/ Pain       Pain Assessment Pain Assessment: No/denies pain  Home Living                                          Prior Functioning/Environment              Frequency  Min 1X/week        Progress Toward Goals  OT Goals(current goals can now be found in the care plan section)  Progress towards OT goals: Progressing toward goals  Acute Rehab OT Goals Patient Stated Goal: to go home OT Goal Formulation: With patient/family Time For Goal Achievement: 01/26/23 Potential to Achieve Goals: Good  Plan      Co-evaluation                 AM-PAC OT "6 Clicks" Daily Activity     Outcome Measure   Help from another person eating meals?: None Help from another person taking care of personal grooming?: A Little Help from another person toileting, which includes using toliet, bedpan, or urinal?: A Little Help from another person bathing (including washing, rinsing, drying)?: A Little Help from another person to put on and taking off regular upper body clothing?: None Help from another person to put on and taking off regular lower body clothing?: A Little 6 Click Score: 20    End of Session Equipment Utilized During Treatment: Gait belt;Rolling walker (2 wheels)  OT Visit Diagnosis: Unsteadiness on feet (R26.81);Muscle weakness (generalized) (M62.81)   Activity Tolerance Patient tolerated treatment well   Patient Left with family/visitor present;in chair;with chair alarm set;with call bell/phone within reach   Nurse Communication Mobility status        Time: 7322-0254 OT Time Calculation (min): 33 min  Charges: OT General Charges $OT Visit: 1 Visit OT Treatments $Self Care/Home Management : 23-37  mins  Rockney Ghee, M.S., OTR/L 01/14/23, 12:53 PM

## 2023-01-14 NOTE — Plan of Care (Signed)

## 2023-01-14 NOTE — Progress Notes (Signed)
Physical Therapy Treatment Patient Details Name: Deanna Joseph MRN: [id number] DOB: [birth year] Today's Date: 01/14/2023   History of Present Illness Pt is a 57 y/o female presenting to hospital with dyspnea, worsening LE edema and pain. Admitted to hospital for acute metabolic encephalopathy, LE swelling, and acute on chronic CHF. PMH includes: nonalcoholic Wernicke's Korsakoff syndrome, T2DM, CKD stage 3, and G1DD.    PT Comments  Pt was pleasant and motivated to participate during the session and put forth good effort throughout. Pt found in bed finishing up breakfast. Pt's O2 in high 90's throughout session. Pt preformed STS from multiple heights and surfaces with VC for hand placement. Transitions continue to be slow but are requiring no physical assist from PT. Pt able to ambulate 100 feet today with chair follow. Ambulation stopped short per pt's report of feeling dizzy. Pt O2 in high 90's and showing no signs of dyspnea or labored breathing. Ultimately sat down per pt's request. Pt will benefit from continued PT services upon discharge to safely address deficits listed in patient problem list for decreased caregiver assistance and eventual return to PLOF.     If plan is discharge home, recommend the following: A little help with walking and/or transfers;A little help with bathing/dressing/bathroom;Assistance with cooking/housework;Assist for transportation   Can travel by private vehicle        Equipment Recommendations  None recommended by PT    Recommendations for Other Services       Precautions / Restrictions Precautions Precautions: Fall Restrictions Weight Bearing Restrictions: No     Mobility  Bed Mobility   Bed Mobility: Supine to Sit     Supine to sit: Supervision, HOB elevated          Transfers Overall transfer level: Needs assistance Equipment used: Rolling walker (2 wheels) Transfers: Sit to/from Stand Sit to Stand: Contact guard assist            General transfer comment: Preformed multiple STS from varying heights and surfaces, VC given for hand placement and sequencing    Ambulation/Gait Ambulation/Gait assistance: Contact guard assist Gait Distance (Feet): 100 Feet Assistive device: Rolling walker (2 wheels)   Gait velocity: decreased     General Gait Details: with chair follow, bout stopped short due to pt reporting feeling dizzy, SpO2 read 98% but till sat per pt's request   Stairs             Wheelchair Mobility     Tilt Bed    Modified Rankin (Stroke Patients Only)       Balance Overall balance assessment: Needs assistance Sitting-balance support: Feet supported, No upper extremity supported Sitting balance-Leahy Scale: Good     Standing balance support: Reliant on assistive device for balance, During functional activity, Bilateral upper extremity supported Standing balance-Leahy Scale: Fair Standing balance comment: During static and functional ambulation                            Cognition Arousal: Alert Behavior During Therapy: WFL for tasks assessed/performed Overall Cognitive Status: History of cognitive impairments - at baseline                                 General Comments: Redirections required to maintain task appropriate behavior        Exercises      General Comments        Pertinent  Vitals/Pain Pain Assessment Pain Assessment: Faces Faces Pain Scale: Hurts a little bit Pain Location: generalized Pain Descriptors / Indicators: Discomfort Pain Intervention(s): Monitored during session    Home Living                          Prior Function            PT Goals (current goals can now be found in the care plan section) Progress towards PT goals: Progressing toward goals    Frequency    Min 1X/week      PT Plan      Co-evaluation              AM-PAC PT "6 Clicks" Mobility   Outcome Measure  Help needed  turning from your back to your side while in a flat bed without using bedrails?: A Little Help needed moving from lying on your back to sitting on the side of a flat bed without using bedrails?: A Little Help needed moving to and from a bed to a chair (including a wheelchair)?: A Little Help needed standing up from a chair using your arms (e.g., wheelchair or bedside chair)?: A Little Help needed to walk in hospital room?: A Little Help needed climbing 3-5 steps with a railing? : A Little 6 Click Score: 18    End of Session Equipment Utilized During Treatment: Gait belt Activity Tolerance: Patient tolerated treatment well Patient left: in chair;with call bell/phone within reach;with chair alarm set Nurse Communication: Mobility status PT Visit Diagnosis: Pain     Time: 6578-4696 PT Time Calculation (min) (ACUTE ONLY): 32 min  Charges:                            Cecile Sheerer, SPT 01/14/23, 11:43 AM

## 2023-01-14 NOTE — Progress Notes (Signed)
[facility]  ROUNDING NOTE   Subjective:   Patient seen sitting up in bed Breakfast at bedside Denies pain or discomfort  Sodium 126 UOP  Objective:  Vital signs in last 24 hours:  Temp:  [97.8 F (36.6 C)-98.3 F (36.8 C)] 97.9 F (36.6 C) (08/09 1149) Pulse Rate:  [72-89] 89 (08/09 1149) Resp:  [16-20] 19 (08/09 1149) BP: (110-144)/(53-64) 117/54 (08/09 1149) SpO2:  [99 %-100 %] 99 % (08/09 1149)  Weight change:  Filed Weights   01/10/23 1856 01/13/23 0747  Weight: 85.7 kg 70.7 kg    Intake/Output: I/O last 3 completed shifts: In: 1540 [P.O.:1540] Out: 3050 [Urine:3050]   Intake/Output this shift:  Total I/O In: 774 [P.O.:774] Out: -   Physical Exam: General: NAD  Head: Normocephalic, atraumatic. Moist oral mucosal membranes  Eyes: Anicteric  Lungs:  Clear to auscultation, normal effort  Heart: Regular rate and rhythm  Abdomen:  Soft, nontender  Extremities:  1+ peripheral edema.  Neurologic: Alert and oriented, moving all four extremities  Skin: No lesions  Access: None    Basic Metabolic Panel: Recent Labs  Lab 01/10/23 1858 01/11/23 0227 01/11/23 0435 01/11/23 1211 01/11/23 1601 01/12/23 0004 01/12/23 0355 01/12/23 0811 01/13/23 0523 01/14/23 0340  NA 122* 126*   < > 129*   < > 130* 132* 133* 126* 126*  K 3.9  --   --  4.2  --   --   --  4.3 4.3 4.6  CL 92*  --   --   --   --   --   --   --  92* 93*  CO2 20*  --   --   --   --   --   --   --  24 26  GLUCOSE 179*  --   --   --   --   --   --   --  130* 138*  BUN 26*  --   --   --   --   --   --   --  41* 47*  CREATININE 1.25* 1.35*  --   --   --   --   --  1.52* 1.34* 1.43*  CALCIUM 8.0*  --   --   --   --   --   --   --  8.4* 8.0*  MG  --   --   --  2.0  --   --   --  2.0 2.1  --    < > = values in this interval not displayed.    Liver Function Tests: No results for input(s): "AST", "ALT", "ALKPHOS", "BILITOT", "PROT", "ALBUMIN" in the last 168 hours.  No  results for input(s): "LIPASE", "AMYLASE" in the last 168 hours. No results for input(s): "AMMONIA" in the last 168 hours.  CBC: Recent Labs  Lab 01/10/23 1858 01/11/23 0227 01/12/23 0355  WBC 8.2 7.6 6.8  NEUTROABS  --   --  4.0  HGB 10.8* 10.3* 10.6*  HCT 32.7* 30.5* 31.3*  MCV 92.1 90.8 90.5  PLT 149* 271 304    Cardiac Enzymes: No results for input(s): "CKTOTAL", "CKMB", "CKMBINDEX", "TROPONINI" in the last 168 hours.  BNP: Invalid input(s): "POCBNP"  CBG: Recent Labs  Lab 01/13/23 1219 01/13/23 1615 01/13/23 2100 01/14/23 0811 01/14/23 1204  GLUCAP 163* 152* 172* 136* 208*    Microbiology: Results for orders placed or performed during the hospital encounter of 09/30/22  Urine Culture  Status: Abnormal   Collection Time: 09/30/22  6:46 AM   Specimen: Urine, Clean Catch  Result Value Ref Range Status   Specimen Description   Final    URINE, CLEAN CATCH Performed at [facility], [address], [place], [place] [address]    Special Requests   Final    NONE Performed at [facility], [address] [place], [place] [address]    Culture >=100,000 COLONIES/mL KLEBSIELLA PNEUMONIAE (A)  Final   Report Status 10/02/2022 FINAL  Final   Organism ID, Bacteria KLEBSIELLA PNEUMONIAE (A)  Final      Susceptibility   Klebsiella pneumoniae - MIC*    AMPICILLIN >=32 RESISTANT Resistant     CEFAZOLIN <=4 SENSITIVE Sensitive     CEFEPIME <=0.12 SENSITIVE Sensitive     CEFTRIAXONE <=0.25 SENSITIVE Sensitive     CIPROFLOXACIN <=0.25 SENSITIVE Sensitive     GENTAMICIN <=1 SENSITIVE Sensitive     IMIPENEM <=0.25 SENSITIVE Sensitive     NITROFURANTOIN 64 INTERMEDIATE Intermediate     TRIMETH/SULFA <=20 SENSITIVE Sensitive     AMPICILLIN/SULBACTAM 8 SENSITIVE Sensitive     PIP/TAZO <=4 SENSITIVE Sensitive     * >=100,000 COLONIES/mL KLEBSIELLA PNEUMONIAE    Coagulation Studies: No results for input(s): "LABPROT", "INR" in the last 72  hours.  Urinalysis: No results for input(s): "COLORURINE", "LABSPEC", "PHURINE", "GLUCOSEU", "HGBUR", "BILIRUBINUR", "KETONESUR", "PROTEINUR", "UROBILINOGEN", "NITRITE", "LEUKOCYTESUR" in the last 72 hours.  Invalid input(s): "APPERANCEUR"    Imaging: No results found.   Medications:     feeding supplement (GLUCERNA SHAKE)  237 mL Oral TID BM   furosemide  40 mg Oral Daily   insulin aspart  0-15 Units Subcutaneous TID WC   insulin aspart  0-5 Units Subcutaneous QHS   metoprolol succinate  12.5 mg Oral Daily   sodium chloride  1 g Oral TID WC   traZODone  100 mg Oral QHS   acetaminophen **OR** acetaminophen, ALPRAZolam, haloperidol, melatonin, ondansetron **OR** ondansetron (ZOFRAN) IV, oxyCODONE  Assessment/ Plan:  Deanna Joseph is a 57 y.o.  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  Sodium on admission 122.   Sodium 126 today, continue fluid restriction and oral furosemide.    2.  Chronic kidney disease stage IIIa.  Remains stable  Will scheduled an appt in our office at discharge.   3. Anemia of chronic kidney disease Lab Results  Component Value Date   HGB 10.6 (L) 01/12/2023    Hgb within desired range.   4. 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 ost recent hemoglobin A1c is 7.9 on 09/30/22.   Glucose well controlled.    LOS: 3   8/9/20242:26 PM

## 2023-01-14 NOTE — Progress Notes (Signed)
Progress Note   [name] HYQ:[id number] DOB: Nov 15, 1965 DOA: 01/10/2023     3 DOS: the patient was seen and examined on 01/14/2023    Brief hospital course: From HPI "Deanna Joseph is a 57 y.o. 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  At her first visit on 8/1 she had a CTA chest that was negative for PE and she was diuresed and discharged.  Her husband brought her in because of worsening lower extremity edema aching pain and increased confusion from her baseline described as increased repetitiveness, asking the same question over and over again.  She has been compliant with the Lasix and she has had intermittent chest discomfort.  He states for the last couple weeks, she has been eating every 2 hours and drinking a lot of water. ED course and data review: Vitals unremarkable.  Labs notable forB NP of 198, up from 131 5 days prior and sodium of 122, down from 131 5 days ago.  Creatinine was increased to 1.25 from her baseline around 1.09.  Bicarb 20.  Hemoglobin at baseline at 10.8, troponin 9.  Serum osmolality 281. EKG, personally viewed and interpreted showing sinus at 96 with LVH and nonspecific ST-T wave changes. CT head nonacute Chest x-ray nonacute Patient given a dose of Lasix 60 mg IV Hospitalist consulted for admission "   Further hospital course and management as outlined below.    Subjective:  Patient seen and examined at bedside this morning. She was up in recliner.  She asks for another Glucerna and another cup of diet soda so she can sip on those, states she won't drink them quickly.  Also asking if she can have more food. Continues to report feeling hungry all the time.  No other complaints or acute events reported.     Assessment and Plan: Acute hyponatremia likely in the  setting of volume overload-improving Acute metabolic encephalopathy- due to hyponatremia, now resolved, mentation at baseline Patient more confused than baseline and with sodium 122 down from 131 a few days prior. Suspect due to polydipsia as pt constantly requests to drink more fluids, husband notes same at home. Pt was diuresed with IV Lasix and sodium improved to 133 Na today 133 >> 126 (started fluid restriction) >> 126 Fluid restriction 1200 cc/day Resumed on home Lasix 40 mg PO daily Daily BMP's to monitor Nephrology following - appreciate recommendations   Acute on chronic diastolic CHF (congestive heart failure) (HCC) Grade 1 diastolic dysfunction (EF 60 to 65% 09/2022) Patient was seen by cardiology on 8/1 BNP just over 100 and chest x-ray clear but patient fluid overloaded Treated with IV Lasix 40 mg BID -- last dose to be given this AM Now resumed on PO Lasix 40 mg daily Started on metoprolol 12.5 mg daily  Monitor electrolytes and renal function closely  Daily weights with intake and output monitoring Echocardiogram - EF 60-65%, no LV WMA's, grade I DD Cardiology following - appreciate recommendations    Type 2 diabetes mellitus with hyperlipidemia (HCC) Last A1c 7.9% on 09/30/22, poorly controlled. Sliding scale insulin AC/HS Monitor glucose closely    AKI superimposed on CKD stage IIIa Metabolic acidosis Continue management of heart failure as above Monitor renal function closely Renally dose all drugs Nephrology following -- to arrange outpatient follow up  DVT prophylaxis: Lovenox    Consults: nephrology, cardiology    Advance Care Planning:   Code Status: Full code    Family Communication: husband at bedside on rounds 8/7, not present today, will attempt to call.    Disposition Plan: Home with [facility] PT    Physical Exam: General exam: awake, alert, no acute distress, tearful complaining of hunger HEENT: moist mucus membranes, hearing grossly  normal  Respiratory system: on room air, normal respiratory effort. Cardiovascular system: RRR, no edema (resolved), intact pedal pulses.   Gastrointestinal system: soft, NT, ND Central nervous system: no gross focal neurologic deficits, normal speech Extremities: moves all, no edema, normal tone Skin: dry, intact, normal temperature Psychiatry: anxious mood, congruent affect, abnormal judgement and insight      Vitals:   01/13/23 2326 01/14/23 0348 01/14/23 0810 01/14/23 1149  BP: 133/61 134/64 (!) 130/57 (!) 117/54  Pulse: 72 72 74 89  Resp:  18 20 19   Temp: 98.3 F (36.8 C) 98 F (36.7 C) 97.8 F (36.6 C) 97.9 F (36.6 C)  TempSrc: Oral     SpO2: 99% 100% 99% 99%  Weight:      Height:        Data Reviewed: Notable labs ---  Na 133 >> 126 >> 126 Cr 1.52 >> 1.34 >> 1.42 Ca 8.0, Cl 93, glucose 138, BUN 47    Author: Pennie Banter, DO 01/14/2023 2:37 PM  For on call review [email or web address].

## 2023-01-15 ENCOUNTER — Encounter: Payer: Self-pay | Admitting: Internal Medicine

## 2023-01-15 DIAGNOSIS — E871 Hypo-osmolality and hyponatremia: Secondary | ICD-10-CM | POA: Diagnosis not present

## 2023-01-15 LAB — GLUCOSE, CAPILLARY
Glucose-Capillary: 154 mg/dL — ABNORMAL HIGH (ref 70–99)
Glucose-Capillary: 181 mg/dL — ABNORMAL HIGH (ref 70–99)

## 2023-01-15 LAB — TSH: TSH: 2.992 u[IU]/mL (ref 0.350–4.500)

## 2023-01-15 LAB — CORTISOL-AM, BLOOD: Cortisol - AM: 15.2 ug/dL (ref 6.7–22.6)

## 2023-01-15 MED ORDER — FUROSEMIDE 40 MG PO TABS: 40.0000 mg | ORAL_TABLET | Freq: Every day | ORAL | 1 refills | Status: DC

## 2023-01-15 MED ORDER — METOPROLOL SUCCINATE ER 25 MG PO TB24: 12.5000 mg | ORAL_TABLET | Freq: Every day | ORAL | 1 refills | Status: DC

## 2023-01-15 NOTE — Progress Notes (Signed)
[facility]  ROUNDING NOTE   Subjective:   Patient sitting up in bed Currently eating breakfast Requesting more water Reminded of her fluid restriction.   Sodium 131  Objective:  Vital signs in last 24 hours:  Temp:  [97.5 F (36.4 C)-98 F (36.7 C)] 97.5 F (36.4 C) (08/10 1137) Pulse Rate:  [83-95] 93 (08/10 1137) Resp:  [14-20] 16 (08/10 1137) BP: (129-150)/(54-69) 138/54 (08/10 1137) SpO2:  [97 %-100 %] 100 % (08/10 1137)  Weight change:  Filed Weights   01/10/23 1856 01/13/23 0747  Weight: 85.7 kg 70.7 kg    Intake/Output: I/O last 3 completed shifts: In: 1834 [P.O.:1834] Out: 400 [Urine:400]   Intake/Output this shift:  No intake/output data recorded.  Physical Exam: General: NAD  Head: Normocephalic, atraumatic. Moist oral mucosal membranes  Eyes: Anicteric  Lungs:  Clear to auscultation, normal effort  Heart: Regular rate and rhythm  Abdomen:  Soft, nontender  Extremities:  1+ peripheral edema.  Neurologic: Alert and oriented, moving all four extremities  Skin: No lesions  Access: None    Basic Metabolic Panel: Recent Labs  Lab 01/10/23 1858 01/11/23 0227 01/11/23 0435 01/11/23 1211 01/11/23 1601 01/12/23 0355 01/12/23 0811 01/13/23 0523 01/14/23 0340 01/15/23 0510  NA 122* 126*   < > 129*   < > 132* 133* 126* 126* 131*  K 3.9  --   --  4.2  --   --  4.3 4.3 4.6 4.3  CL 92*  --   --   --   --   --   --  92* 93* 99  CO2 20*  --   --   --   --   --   --  24 26 24   GLUCOSE 179*  --   --   --   --   --   --  130* 138* 187*  BUN 26*  --   --   --   --   --   --  41* 47* 56*  CREATININE 1.25* 1.35*  --   --   --   --  1.52* 1.34* 1.43* 1.57*  CALCIUM 8.0*  --   --   --   --   --   --  8.4* 8.0* 8.1*  MG  --   --   --  2.0  --   --  2.0 2.1  --   --    < > = values in this interval not displayed.    Liver Function Tests: No results for input(s): "AST", "ALT", "ALKPHOS", "BILITOT", "PROT", "ALBUMIN" in the last 168 hours.  No  results for input(s): "LIPASE", "AMYLASE" in the last 168 hours. No results for input(s): "AMMONIA" in the last 168 hours.  CBC: Recent Labs  Lab 01/10/23 1858 01/11/23 0227 01/12/23 0355  WBC 8.2 7.6 6.8  NEUTROABS  --   --  4.0  HGB 10.8* 10.3* 10.6*  HCT 32.7* 30.5* 31.3*  MCV 92.1 90.8 90.5  PLT 149* 271 304    Cardiac Enzymes: No results for input(s): "CKTOTAL", "CKMB", "CKMBINDEX", "TROPONINI" in the last 168 hours.  BNP: Invalid input(s): "POCBNP"  CBG: Recent Labs  Lab 01/14/23 1204 01/14/23 1556 01/14/23 2103 01/15/23 0819 01/15/23 1141  GLUCAP 208* 142* 155* 154* 181*    Microbiology: Results for orders placed or performed during the hospital encounter of 09/30/22  Urine Culture     Status: Abnormal   Collection Time: 09/30/22  6:46 AM   Specimen:  Urine, Clean Catch  Result Value Ref Range Status   Specimen Description   Final    URINE, CLEAN CATCH Performed at [facility], [address] [place], [place] [address]    Special Requests   Final    NONE Performed at [facility], [address] [place], [place] [address]    Culture >=100,000 COLONIES/mL KLEBSIELLA PNEUMONIAE (A)  Final   Report Status 10/02/2022 FINAL  Final   Organism ID, Bacteria KLEBSIELLA PNEUMONIAE (A)  Final      Susceptibility   Klebsiella pneumoniae - MIC*    AMPICILLIN >=32 RESISTANT Resistant     CEFAZOLIN <=4 SENSITIVE Sensitive     CEFEPIME <=0.12 SENSITIVE Sensitive     CEFTRIAXONE <=0.25 SENSITIVE Sensitive     CIPROFLOXACIN <=0.25 SENSITIVE Sensitive     GENTAMICIN <=1 SENSITIVE Sensitive     IMIPENEM <=0.25 SENSITIVE Sensitive     NITROFURANTOIN 64 INTERMEDIATE Intermediate     TRIMETH/SULFA <=20 SENSITIVE Sensitive     AMPICILLIN/SULBACTAM 8 SENSITIVE Sensitive     PIP/TAZO <=4 SENSITIVE Sensitive     * >=100,000 COLONIES/mL KLEBSIELLA PNEUMONIAE    Coagulation Studies: No results for input(s): "LABPROT", "INR" in the last 72  hours.  Urinalysis: No results for input(s): "COLORURINE", "LABSPEC", "PHURINE", "GLUCOSEU", "HGBUR", "BILIRUBINUR", "KETONESUR", "PROTEINUR", "UROBILINOGEN", "NITRITE", "LEUKOCYTESUR" in the last 72 hours.  Invalid input(s): "APPERANCEUR"    Imaging: No results found.   Medications:     feeding supplement (GLUCERNA SHAKE)  237 mL Oral TID BM   furosemide  40 mg Oral Daily   insulin aspart  0-15 Units Subcutaneous TID WC   insulin aspart  0-5 Units Subcutaneous QHS   metoprolol succinate  12.5 mg Oral Daily   traZODone  100 mg Oral QHS   acetaminophen **OR** acetaminophen, ALPRAZolam, melatonin, ondansetron **OR** ondansetron (ZOFRAN) IV, oxyCODONE  Assessment/ Plan:  Deanna Joseph is a 57 y.o.  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  Sodium on admission 122.   Sodium 131, continue fluid restriction and oral furosemide.    2.  Chronic kidney disease stage IIIa.  Creatinine slightly increased. Agree with furosemide being reduced to daily. Will monitor and continue to follow patient in our office.   3. Anemia of chronic kidney disease Lab Results  Component Value Date   HGB 10.6 (L) 01/12/2023    Hgb at goal..   4. 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 ost recent hemoglobin A1c is 7.9 on 09/30/22.   Primary team will manage SSI    LOS: 4   8/10/202412:38 PM

## 2023-01-15 NOTE — Plan of Care (Signed)

## 2023-01-15 NOTE — TOC Transition Note (Signed)
Transition of Care [facility]) - CM/SW Discharge Note   Patient Details  Name: Deanna Joseph MRN: [id number] Date of Birth: [birth year]  [facility] CM/SW Contact:  Liliana Cline, LCSW Phone Number: 01/15/2023, 10:37 AM   Clinical Narrative:    Patient has orders to DC home today.  Notified Katina with [facility] - orders in for PT and OT.   Final next level of care: Home w Home Health Services Barriers to Discharge: Barriers Resolved   Patient Goals and CMS Choice CMS Medicare.gov Compare Post Acute Care list provided to:: Patient Choice offered to / list presented to : Patient  Discharge Placement                         Discharge Plan and Services Additional resources added to the After Visit Summary for                            [facility] Arranged: PT, OT HH Agency: [facility] Date [facility] Agency Contacted: 01/15/23 Time HH Agency Contacted: 1527 Representative spoke with at [facility] Agency: Laurelyn Sickle  Social Determinants of Health (SDOH) Interventions SDOH Screenings   Food Insecurity: No Food Insecurity (01/11/2023)  Housing: Low Risk  (01/11/2023)  Transportation Needs: No Transportation Needs (01/11/2023)  Utilities: Not At Risk (01/11/2023)  Financial Resource Strain: Low Risk  (02/26/2020)   Received from [facility], [facility]  Recent Concern: Financial Resource Strain - Medium Risk (01/13/2020)   Received from [facility]  Tobacco Use: Low Risk  (01/10/2023)     Readmission Risk Interventions     No data to display

## 2023-01-15 NOTE — Discharge Summary (Signed)
Physician Discharge Summary   [name] MRN: [id number] DOB: 1966-01-04  Admit date:     01/10/2023  Discharge date: {dischdate:26783}  Discharge Physician: Pennie Banter   PCP: Hillery Aldo, MD   Recommendations at discharge:  {Tip this will not be part of the note when signed- Example include specific recommendations for outpatient follow-up, pending tests to follow-up on. (Optional):26781}  ***  Discharge Diagnoses: Principal Problem:   Acute hyponatremia Active Problems:   Acute metabolic encephalopathy   Acute on chronic diastolic CHF (congestive heart failure) (HCC)   Diet-controlled diabetes mellitus (HCC)   AKI (acute kidney injury) (HCC)   Metabolic acidosis   Type 2 diabetes mellitus with hyperlipidemia (HCC)   Cognitive deficits  Resolved Problems:   * No resolved hospital problems. *  Hospital Course: No notes on file  Assessment and Plan: * Acute hyponatremia Acute metabolic encephalopathy Patient more confused than baseline and with sodium 122 down from 131 a few days prior Received IV Lasix in the ED 60 mg Uncertain etiology, given increased leg swelling possibly hypotonic though serum osmolality 281 Will get urine sodium and osmolality Will repeat an assess effect of IV Lasix May consider low rate 3% Will admit to stepdown for close observation  Acute on chronic diastolic CHF (congestive heart failure) (HCC) Grade 1 diastolic dysfunction (EF 60 to 65% 09/2022) Patient was seen by cardiology on 8/1 BNP just over 100 and chest x-ray clear but patient fluid overloaded IV Lasix Daily weights with intake and output monitoring Cardiology consult for additional recommendations  Cognitive deficits 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 , ,  (Optional):26781}  {(NOTE) Pain control PDMP Statment (Optional):26782} Consultants: *** Procedures performed: ***  Disposition: {Plan; Disposition:26390} Diet recommendation:  Discharge Diet Orders (From admission, onward)     Start     Ordered   01/15/23 0000  Diet - low sodium heart healthy        01/15/23 1101           {Diet_Plan:26776} DISCHARGE MEDICATION: Allergies as of 01/15/2023       Reactions   Sulfa Antibiotics Hives        Medication List     STOP taking these medications    oxyCODONE 5 MG immediate release tablet Commonly known as: Oxy IR/ROXICODONE   polyethylene glycol 17 g packet Commonly known as: MIRALAX / GLYCOLAX       TAKE these medications    acetaminophen 500 MG tablet Commonly known as: TYLENOL Take 500 mg by mouth every 6 (six) hours as needed.   feeding supplement Liqd Take 237 mLs by mouth 3 (three) times daily between meals.   furosemide 40 MG tablet Commonly known as: LASIX Take 1 tablet (40 mg total) by mouth daily. Start taking on: January 16, 2023 What changed:  medication strength how much to take   metoprolol succinate 25 MG 24 hr tablet Commonly known as: TOPROL-XL Take 0.5 tablets (12.5 mg total) by mouth daily. Start taking on: January 16, 2023   omeprazole 40 MG capsule Commonly known as: PRILOSEC Take 40 mg by mouth daily.   traMADol 50 MG tablet Commonly known as: ULTRAM Take 50  mg by mouth every 6 (six) hours as needed for moderate pain.   traZODone 50 MG tablet Commonly known as: DESYREL Take 50 mg by mouth at bedtime.        Follow-up Information     Custovic, Rozell Searing, DO. Go in 1 week(s).   Specialty: Cardiology Contact information: [address] [place] [address] [phone]                Discharge Exam: Ceasar Mons Weights   01/10/23 1856  01/13/23 0747  Weight: 85.7 kg 70.7 kg   ***  Condition at discharge: {DC Condition:26389}  The results of significant diagnostics from this hospitalization (including imaging, microbiology, ancillary and laboratory) are listed below for reference.   Imaging Studies: ECHOCARDIOGRAM LIMITED  Result Date: 01/12/2023    ECHOCARDIOGRAM LIMITED REPORT   Patient Name:   Deanna Joseph Date of Exam: 01/11/2023 Medical Rec #:  [id number]       Height:       60.0 in Accession #:    [id number]      Weight:       189.0 lb Date of Birth:  Jun 07, 1966      BSA:          1.822 m Patient Age:    57 years        BP:           147/77 mmHg Patient Gender: F               HR:           76 bpm. Exam Location:  [facility] Procedure: 2D Echo, Limited Echo, Limited Color Doppler and Cardiac Doppler Indications:     I50.31 Acute Diastolic CHF  History:         Patient has prior history of Echocardiogram examinations, most                  recent 10/01/2022. Risk Factors:Diabetes.  Sonographer:     Daphine Deutscher RDCS Referring Phys:  [id number] [facility] MICHELLE TANG Diagnosing Phys: Rozell Searing Custovic IMPRESSIONS  1. Left ventricular ejection fraction, by estimation, is 60 to 65%. Left ventricular ejection fraction by PLAX is 63 %. The left ventricle has normal function. The left ventricle has no regional wall motion abnormalities. Left ventricular diastolic parameters are consistent with Grade I diastolic dysfunction (impaired relaxation).  2. Right ventricular systolic function is normal. The right ventricular size is normal.  3. The mitral valve is grossly normal. No evidence of mitral valve regurgitation.  4. The aortic valve is grossly normal. Aortic valve regurgitation is mild to moderate. Aortic regurgitation PHT measures 372 msec. FINDINGS  Left Ventricle: Left ventricular ejection fraction, by estimation, is 60 to 65%. Left ventricular ejection fraction by PLAX is 63 %. The left ventricle has normal function. The left ventricle  has no regional wall motion abnormalities. The left ventricular internal cavity size was normal in size. There is no left ventricular hypertrophy. Left ventricular diastolic parameters are consistent with Grade I diastolic dysfunction (impaired relaxation). Right Ventricle: The right ventricular size is normal. No increase in right ventricular wall thickness. Right ventricular systolic function is normal. Left Atrium: Left atrial size was normal in size. Right Atrium: Right atrial size was normal in size. Pericardium: There is no evidence of pericardial effusion. Mitral Valve: The mitral valve is grossly normal. Tricuspid Valve: The tricuspid valve is grossly normal. Tricuspid valve regurgitation is trivial. Aortic Valve: The aortic valve is grossly normal. Aortic valve  regurgitation is mild to moderate. Aortic regurgitation PHT measures 372 msec. Pulmonic Valve: The pulmonic valve was grossly normal. Pulmonic valve regurgitation is not visualized. Aorta: The aortic root is normal in size and structure. IAS/Shunts: No atrial level shunt detected by color flow Doppler. LEFT VENTRICLE PLAX 2D LV EF:         Left            Diastology                ventricular     LV e' medial:    7.18 cm/s                ejection        LV E/e' medial:  11.7                fraction by     LV e' lateral:   11.75 cm/s                PLAX is 63      LV E/e' lateral: 7.1                %. LVIDd:         3.90 cm LVIDs:         2.60 cm LV PW:         0.60 cm LV IVS:        0.60 cm  RIGHT VENTRICLE RV S prime:     11.40 cm/s TAPSE (M-mode): 2.4 cm LEFT ATRIUM         Index LA diam:    3.30 cm 1.81 cm/m  AORTIC VALVE AI PHT:      372 msec  AORTA Ao Root diam: 3.10 cm MITRAL VALVE MV Area (PHT): 3.81 cm MV Decel Time: 199 msec MV E velocity: 83.85 cm/s MV A velocity: 100.50 cm/s MV E/A ratio:  0.83 Sabina Custovic Electronically signed by Clotilde Dieter Signature Date/Time: 01/12/2023/9:20:19 AM    Final    CT HEAD WO CONTRAST  ( )  Result Date: 01/11/2023 CLINICAL DATA:  Altered mental status EXAM: CT HEAD WITHOUT CONTRAST TECHNIQUE: Contiguous axial images were obtained from the base of the skull through the vertex without intravenous contrast. RADIATION DOSE REDUCTION: This exam was performed according to the departmental dose-optimization program which includes automated exposure control, adjustment of the mA and/or kV according to patient size and/or use of iterative reconstruction technique. COMPARISON:  10/24/2022 FINDINGS: Brain: No acute intracranial abnormality. Specifically, no hemorrhage, hydrocephalus, mass lesion, acute infarction, or significant intracranial injury. Vascular: No hyperdense vessel or unexpected calcification. Skull: No acute calvarial abnormality. Sinuses/Orbits: No acute findings Other: None IMPRESSION: No acute intracranial abnormality. Electronically Signed   By: Charlett Nose M.D.   On: 01/11/2023 00:04   DG Chest 2 View  Result Date: 01/10/2023 CLINICAL DATA:  Shortness of breath EXAM: CHEST - 2 VIEW COMPARISON:  Chest x-ray 01/06/2023 FINDINGS: The heart size and mediastinal contours are within normal limits. Both lungs are clear. Chronic left humeral neck fracture again noted. IMPRESSION: No active cardiopulmonary disease. Electronically Signed   By: Darliss Cheney M.D.   On: 01/10/2023 19:58   CT Angio Chest PE W/Cm &/Or Wo Cm  Result Date: 01/06/2023 CLINICAL DATA:  Severe chest pain. New onset CHF. Evaluate for pulmonary embolism. EXAM: CT ANGIOGRAPHY CHEST WITH CONTRAST TECHNIQUE: Multidetector CT imaging of the chest was performed using the standard protocol during bolus administration of intravenous contrast. Multiplanar CT image reconstructions and MIPs  were obtained to evaluate the vascular anatomy. RADIATION DOSE REDUCTION: This exam was performed according to the departmental dose-optimization program which includes automated exposure control, adjustment of the mA and/or kV according  to patient size and/or use of iterative reconstruction technique. CONTRAST:  75mL OMNIPAQUE IOHEXOL 350 MG/ML SOLN COMPARISON:  Chest radiograph-01/06/2023 FINDINGS: Vascular Findings: There is adequate opacification of the pulmonary arterial system with the main pulmonary artery measuring 433 Hounsfield units. There are no discrete filling defects within the pulmonary arterial tree to suggest pulmonary embolism. Normal caliber of the main pulmonary artery. Normal heart size. No pericardial effusion. Coronary artery calcifications. No evidence of thoracic aortic aneurysm or dissection on this nongated examination. Bovine configuration of the aortic arch. The branch vessels of the aortic arch appear widely patent throughout their imaged courses. Review of the MIP images confirms the above findings. ---------------------------------------------------------------------------------- Nonvascular Findings: Mediastinum/Lymph Nodes: Scattered mediastinal lymph nodes are not enlarged by size criteria, presumably reactive in etiology. No bulky mediastinal, hilar or axillary lymphadenopathy. Lungs/Pleura: No focal airspace opacities. No pleural effusion or pneumothorax. The central pulmonary airways appear widely patent. Punctate granuloma are seen within the lungs bilaterally (images 45, 63 and 68, series 6). No discrete worrisome pulmonary nodules. Upper abdomen: Limited early arterial phase evaluation of the upper abdomen demonstrates mild nodularity of the hepatic contour as could be seen in the setting of early cirrhotic change. Postcholecystectomy. Small hiatal hernia. Debris is seen scattered throughout the esophagus without definitive evidence of esophageal wall thickening. Musculoskeletal: No acute or aggressive osseous abnormalities. Normal appearance of the thyroid. Regional soft tissues appear normal. IMPRESSION: 1. No acute cardiopulmonary disease. Specifically, no evidence of pulmonary embolism. 2. Coronary  artery calcifications. Aortic Atherosclerosis (ICD10-I70.0). 3. Small hiatal hernia with debris scattered throughout the esophagus, nonspecific though could be seen in the setting of gastroesophageal reflux. 4. Mild nodularity of the hepatic contour as could be seen in the setting of early cirrhotic change. Correlation with LFTs is advised. Electronically Signed   By: Simonne Come M.D.   On: 01/06/2023 12:47   DG Chest 2 View  Result Date: 01/06/2023 CLINICAL DATA:  sob EXAM: CHEST - 2 VIEW COMPARISON:  CXR 01/06/23 FINDINGS: The heart size and mediastinal contours are within normal limits. Both lungs are clear. The visualized skeletal structures are unremarkable. Surgical clips in the right upper quadrant IMPRESSION: No focal airspace opacity Electronically Signed   By: Lorenza Cambridge M.D.   On: 01/06/2023 10:16   DG Chest 1 View  Result Date: 01/06/2023 CLINICAL DATA:  Chest pain EXAM: CHEST  1 VIEW COMPARISON:  09/30/2022 FINDINGS: The heart size and mediastinal contours are within normal limits. Both lungs are clear. The visualized skeletal structures are unremarkable. IMPRESSION: No active disease. Electronically Signed   By: Jasmine Pang M.D.   On: 01/06/2023 01:39    Microbiology: Results for orders placed or performed during the hospital encounter of 09/30/22  Urine Culture     Status: Abnormal   Collection Time: 09/30/22  6:46 AM   Specimen: Urine, Clean Catch  Result Value Ref Range Status   Specimen Description   Final    URINE, CLEAN CATCH Performed at [facility], [address] [place], [place] [address]    Special Requests   Final    NONE Performed at [facility], [address], [place], [place] [address]    Culture >=100,000 COLONIES/mL KLEBSIELLA PNEUMONIAE (A)  Final   Report Status 10/02/2022 FINAL  Final   Organism  ID, Bacteria KLEBSIELLA PNEUMONIAE (A)  Final      Susceptibility   Klebsiella pneumoniae - MIC*    AMPICILLIN >=32 RESISTANT Resistant      CEFAZOLIN <=4 SENSITIVE Sensitive     CEFEPIME <=0.12 SENSITIVE Sensitive     CEFTRIAXONE <=0.25 SENSITIVE Sensitive     CIPROFLOXACIN <=0.25 SENSITIVE Sensitive     GENTAMICIN <=1 SENSITIVE Sensitive     IMIPENEM <=0.25 SENSITIVE Sensitive     NITROFURANTOIN 64 INTERMEDIATE Intermediate     TRIMETH/SULFA <=20 SENSITIVE Sensitive     AMPICILLIN/SULBACTAM 8 SENSITIVE Sensitive     PIP/TAZO <=4 SENSITIVE Sensitive     * >=100,000 COLONIES/mL KLEBSIELLA PNEUMONIAE    Labs: CBC: Recent Labs  Lab 01/10/23 1858 01/11/23 0227 01/12/23 0355  WBC 8.2 7.6 6.8  NEUTROABS  --   --  4.0  HGB 10.8* 10.3* 10.6*  HCT 32.7* 30.5* 31.3*  MCV 92.1 90.8 90.5  PLT 149* 271 304   Basic Metabolic Panel: Recent Labs  Lab 01/10/23 1858 01/11/23 0227 01/11/23 0435 01/11/23 1211 01/11/23 1601 01/12/23 0355 01/12/23 0811 01/13/23 0523 01/14/23 0340 01/15/23 0510  NA 122* 126*   < > 129*   < > 132* 133* 126* 126* 131*  K 3.9  --   --  4.2  --   --  4.3 4.3 4.6 4.3  CL 92*  --   --   --   --   --   --  92* 93* 99  CO2 20*  --   --   --   --   --   --  24 26 24   GLUCOSE 179*  --   --   --   --   --   --  130* 138* 187*  BUN 26*  --   --   --   --   --   --  41* 47* 56*  CREATININE 1.25* 1.35*  --   --   --   --  1.52* 1.34* 1.43* 1.57*  CALCIUM 8.0*  --   --   --   --   --   --  8.4* 8.0* 8.1*  MG  --   --   --  2.0  --   --  2.0 2.1  --   --    < > = values in this interval not displayed.   Liver Function Tests: No results for input(s): "AST", "ALT", "ALKPHOS", "BILITOT", "PROT", "ALBUMIN" in the last 168 hours. CBG: Recent Labs  Lab 01/14/23 0811 01/14/23 1204 01/14/23 1556 01/14/23 2103 01/15/23 0819  GLUCAP 136* 208* 142* 155* 154*    Discharge time spent: {LESS THAN/GREATER THAN:26388} 30 minutes.  Signed: Pennie Banter, DO [facility] 01/15/2023

## 2023-01-16 ENCOUNTER — Other Ambulatory Visit: Payer: Self-pay

## 2023-01-16 ENCOUNTER — Emergency Department: Payer: Medicaid Other

## 2023-01-16 ENCOUNTER — Observation Stay
Admission: EM | Admit: 2023-01-16 | Discharge: 2023-01-18 | Disposition: A | Discharge status: Home Health | Payer: Medicaid Other | Attending: Internal Medicine | Admitting: Internal Medicine

## 2023-01-16 DIAGNOSIS — R0602 Shortness of breath: Secondary | ICD-10-CM | POA: Diagnosis not present

## 2023-01-16 DIAGNOSIS — R531 Weakness: Secondary | ICD-10-CM | POA: Diagnosis not present

## 2023-01-16 DIAGNOSIS — R7989 Other specified abnormal findings of blood chemistry: Secondary | ICD-10-CM

## 2023-01-16 DIAGNOSIS — E1165 Type 2 diabetes mellitus with hyperglycemia: Secondary | ICD-10-CM | POA: Insufficient documentation

## 2023-01-16 DIAGNOSIS — R778 Other specified abnormalities of plasma proteins: Secondary | ICD-10-CM | POA: Diagnosis not present

## 2023-01-16 DIAGNOSIS — N1831 Chronic kidney disease, stage 3a: Secondary | ICD-10-CM | POA: Insufficient documentation

## 2023-01-16 DIAGNOSIS — Z1152 Encounter for screening for COVID-19: Secondary | ICD-10-CM | POA: Diagnosis not present

## 2023-01-16 DIAGNOSIS — K219 Gastro-esophageal reflux disease without esophagitis: Secondary | ICD-10-CM | POA: Insufficient documentation

## 2023-01-16 DIAGNOSIS — R0789 Other chest pain: Secondary | ICD-10-CM | POA: Diagnosis not present

## 2023-01-16 DIAGNOSIS — R079 Chest pain, unspecified: Principal | ICD-10-CM | POA: Insufficient documentation

## 2023-01-16 LAB — CBC WITH DIFFERENTIAL/PLATELET
Abs Immature Granulocytes: 0.12 10*3/uL — ABNORMAL HIGH (ref 0.00–0.07)
Basophils Absolute: 0 10*3/uL (ref 0.0–0.1)
Basophils Relative: 0 %
Eosinophils Absolute: 0 10*3/uL (ref 0.0–0.5)
Eosinophils Relative: 0 %
HCT: 28.6 % — ABNORMAL LOW (ref 36.0–46.0)
Hemoglobin: 9.7 g/dL — ABNORMAL LOW (ref 12.0–15.0)
Immature Granulocytes: 1 %
Lymphocytes Relative: 15 %
Lymphs Abs: 1.4 10*3/uL (ref 0.7–4.0)
MCH: 31.4 pg (ref 26.0–34.0)
MCHC: 33.9 g/dL (ref 30.0–36.0)
MCV: 92.6 fL (ref 80.0–100.0)
Monocytes Absolute: 0.7 10*3/uL (ref 0.1–1.0)
Monocytes Relative: 8 %
Neutro Abs: 6.7 10*3/uL (ref 1.7–7.7)
Neutrophils Relative %: 76 %
Platelets: 346 10*3/uL (ref 150–400)
RBC: 3.09 MIL/uL — ABNORMAL LOW (ref 3.87–5.11)
RDW: 13.3 % (ref 11.5–15.5)
WBC: 9 10*3/uL (ref 4.0–10.5)
nRBC: 0 % (ref 0.0–0.2)

## 2023-01-16 LAB — TROPONIN I (HIGH SENSITIVITY)
Troponin I (High Sensitivity): 26 ng/L — ABNORMAL HIGH (ref <18)
Troponin I (High Sensitivity): 30 ng/L — ABNORMAL HIGH (ref <18)

## 2023-01-16 LAB — HEPATIC FUNCTION PANEL
ALT: 19 U/L (ref 0–44)
AST: 20 U/L (ref 15–41)
Albumin: 3.2 g/dL — ABNORMAL LOW (ref 3.5–5.0)
Alkaline Phosphatase: 71 U/L (ref 38–126)
Bilirubin, Direct: <0.1 mg/dL (ref 0.0–0.2)
Total Bilirubin: 0.6 mg/dL (ref 0.3–1.2)
Total Protein: 6.5 g/dL (ref 6.5–8.1)

## 2023-01-16 LAB — BASIC METABOLIC PANEL
Anion gap: 12 (ref 5–15)
BUN: 46 mg/dL — ABNORMAL HIGH (ref 6–20)
CO2: 19 mmol/L — ABNORMAL LOW (ref 22–32)
Calcium: 8 mg/dL — ABNORMAL LOW (ref 8.9–10.3)
Chloride: 98 mmol/L (ref 98–111)
Creatinine, Ser: 1.37 mg/dL — ABNORMAL HIGH (ref 0.44–1.00)
GFR, Estimated: 45 mL/min — ABNORMAL LOW (ref >60)
Glucose, Bld: 299 mg/dL — ABNORMAL HIGH (ref 70–99)
Potassium: 4.3 mmol/L (ref 3.5–5.1)
Sodium: 129 mmol/L — ABNORMAL LOW (ref 135–145)

## 2023-01-16 LAB — SARS CORONAVIRUS 2 BY RT PCR: SARS Coronavirus 2 by RT PCR: NEGATIVE

## 2023-01-16 LAB — BRAIN NATRIURETIC PEPTIDE: B Natriuretic Peptide: 162.4 pg/mL — ABNORMAL HIGH (ref 0.0–100.0)

## 2023-01-16 LAB — MAGNESIUM: Magnesium: 2.2 mg/dL (ref 1.7–2.4)

## 2023-01-16 MED ORDER — NITROGLYCERIN 0.4 MG SL SUBL
0.4000 mg | SUBLINGUAL_TABLET | SUBLINGUAL | Status: DC | PRN
  Administered 2023-01-16 – 2023-01-17 (×4): 0.4 mg via SUBLINGUAL
  Filled 2023-01-16 (×2): qty 1

## 2023-01-16 MED ORDER — MORPHINE SULFATE (PF) 2 MG/ML IV SOLN
2.0000 mg | Freq: Once | INTRAVENOUS | Status: AC
  Administered 2023-01-16: 2 mg via INTRAVENOUS
  Filled 2023-01-16: qty 1

## 2023-01-16 MED ORDER — ASPIRIN 325 MG PO TABS
325.0000 mg | ORAL_TABLET | Freq: Once | ORAL | Status: AC
  Administered 2023-01-16: 325 mg via ORAL
  Filled 2023-01-16: qty 1

## 2023-01-16 MED ORDER — ACETAMINOPHEN 325 MG PO TABS
650.0000 mg | ORAL_TABLET | Freq: Once | ORAL | Status: AC
  Administered 2023-01-16: 650 mg via ORAL
  Filled 2023-01-16: qty 2

## 2023-01-16 MED ORDER — ASPIRIN 325 MG PO TABS: 325.0000 mg | ORAL_TABLET | Freq: Every day | ORAL | Status: DC

## 2023-01-16 NOTE — ED Triage Notes (Signed)
Pt states she has had body aches, chest pain, sob and bilateral foot swelling since earlier today. Pt was just d/c from here yesterday after a week stay with CHF exacerbation.

## 2023-01-17 ENCOUNTER — Other Ambulatory Visit: Payer: Self-pay

## 2023-01-17 ENCOUNTER — Encounter: Payer: Self-pay | Admitting: Family Medicine

## 2023-01-17 DIAGNOSIS — E1165 Type 2 diabetes mellitus with hyperglycemia: Secondary | ICD-10-CM | POA: Diagnosis not present

## 2023-01-17 DIAGNOSIS — R079 Chest pain, unspecified: Secondary | ICD-10-CM

## 2023-01-17 DIAGNOSIS — N1831 Chronic kidney disease, stage 3a: Secondary | ICD-10-CM | POA: Diagnosis not present

## 2023-01-17 DIAGNOSIS — I5033 Acute on chronic diastolic (congestive) heart failure: Secondary | ICD-10-CM | POA: Diagnosis not present

## 2023-01-17 DIAGNOSIS — K219 Gastro-esophageal reflux disease without esophagitis: Secondary | ICD-10-CM

## 2023-01-17 DIAGNOSIS — N183 Chronic kidney disease, stage 3 unspecified: Secondary | ICD-10-CM | POA: Diagnosis not present

## 2023-01-17 DIAGNOSIS — I2489 Other forms of acute ischemic heart disease: Secondary | ICD-10-CM | POA: Diagnosis not present

## 2023-01-17 DIAGNOSIS — R002 Palpitations: Secondary | ICD-10-CM | POA: Diagnosis not present

## 2023-01-17 LAB — BASIC METABOLIC PANEL
Anion gap: 5 (ref 5–15)
BUN: 37 mg/dL — ABNORMAL HIGH (ref 6–20)
CO2: 23 mmol/L (ref 22–32)
Calcium: 7.2 mg/dL — ABNORMAL LOW (ref 8.9–10.3)
Chloride: 106 mmol/L (ref 98–111)
Creatinine, Ser: 1.09 mg/dL — ABNORMAL HIGH (ref 0.44–1.00)
GFR, Estimated: 60 mL/min — ABNORMAL LOW (ref >60)
Glucose, Bld: 148 mg/dL — ABNORMAL HIGH (ref 70–99)
Potassium: 3.9 mmol/L (ref 3.5–5.1)
Sodium: 134 mmol/L — ABNORMAL LOW (ref 135–145)

## 2023-01-17 LAB — TROPONIN I (HIGH SENSITIVITY)
Troponin I (High Sensitivity): 19 ng/L — ABNORMAL HIGH (ref <18)
Troponin I (High Sensitivity): 10 ng/L (ref <18)
Troponin I (High Sensitivity): 9 ng/L (ref <18)

## 2023-01-17 LAB — HEMOGLOBIN A1C
Hgb A1c MFr Bld: 6.7 % — ABNORMAL HIGH (ref 4.8–5.6)
Mean Plasma Glucose: 146 mg/dL

## 2023-01-17 LAB — CBC
HCT: 27.6 % — ABNORMAL LOW (ref 36.0–46.0)
Hemoglobin: 9.2 g/dL — ABNORMAL LOW (ref 12.0–15.0)
MCH: 31.2 pg (ref 26.0–34.0)
MCHC: 33.3 g/dL (ref 30.0–36.0)
MCV: 93.6 fL (ref 80.0–100.0)
Platelets: 310 10*3/uL (ref 150–400)
RBC: 2.95 MIL/uL — ABNORMAL LOW (ref 3.87–5.11)
RDW: 13.3 % (ref 11.5–15.5)
WBC: 8.9 10*3/uL (ref 4.0–10.5)
nRBC: 0 % (ref 0.0–0.2)

## 2023-01-17 LAB — GLUCOSE, CAPILLARY
Glucose-Capillary: 160 mg/dL — ABNORMAL HIGH (ref 70–99)
Glucose-Capillary: 158 mg/dL — ABNORMAL HIGH (ref 70–99)

## 2023-01-17 MED ORDER — TRAZODONE HCL 50 MG PO TABS
25.0000 mg | ORAL_TABLET | Freq: Every evening | ORAL | Status: DC | PRN
  Filled 2023-01-17: qty 1

## 2023-01-17 MED ORDER — ONDANSETRON HCL 4 MG/2ML IJ SOLN
4.0000 mg | Freq: Four times a day (QID) | INTRAMUSCULAR | Status: DC | PRN
  Administered 2023-01-17: 4 mg via INTRAVENOUS
  Filled 2023-01-17: qty 2

## 2023-01-17 MED ORDER — ENOXAPARIN SODIUM 40 MG/0.4ML IJ SOSY
40.0000 mg | PREFILLED_SYRINGE | INTRAMUSCULAR | Status: DC
  Administered 2023-01-17 – 2023-01-18 (×2): 40 mg via SUBCUTANEOUS
  Filled 2023-01-17 (×2): qty 0.4

## 2023-01-17 MED ORDER — FUROSEMIDE 10 MG/ML IJ SOLN
60.0000 mg | Freq: Once | INTRAMUSCULAR | Status: AC
  Administered 2023-01-17: 60 mg via INTRAVENOUS
  Filled 2023-01-17: qty 8

## 2023-01-17 MED ORDER — FUROSEMIDE 40 MG PO TABS
40.0000 mg | ORAL_TABLET | Freq: Two times a day (BID) | ORAL | Status: DC
  Administered 2023-01-18: 40 mg via ORAL
  Filled 2023-01-17: qty 1

## 2023-01-17 MED ORDER — ACETAMINOPHEN 650 MG RE SUPP: 650.0000 mg | Freq: Four times a day (QID) | RECTAL | Status: DC | PRN

## 2023-01-17 MED ORDER — ASPIRIN 325 MG PO TBEC: 325.0000 mg | DELAYED_RELEASE_TABLET | Freq: Every day | ORAL | Status: DC

## 2023-01-17 MED ORDER — INSULIN ASPART 100 UNIT/ML IJ SOLN: 0.0000 [IU] | Freq: Every day | INTRAMUSCULAR | Status: DC

## 2023-01-17 MED ORDER — TRAMADOL HCL 50 MG PO TABS
50.0000 mg | ORAL_TABLET | Freq: Four times a day (QID) | ORAL | Status: DC | PRN
  Administered 2023-01-17 (×2): 50 mg via ORAL
  Filled 2023-01-17 (×2): qty 1

## 2023-01-17 MED ORDER — METOPROLOL SUCCINATE ER 25 MG PO TB24
25.0000 mg | ORAL_TABLET | Freq: Every day | ORAL | Status: DC
  Administered 2023-01-17 – 2023-01-18 (×2): 25 mg via ORAL
  Filled 2023-01-17 (×2): qty 1

## 2023-01-17 MED ORDER — MAGNESIUM HYDROXIDE 400 MG/5ML PO SUSP: 30.0000 mL | Freq: Every day | ORAL | Status: DC | PRN

## 2023-01-17 MED ORDER — PANTOPRAZOLE SODIUM 40 MG PO TBEC
40.0000 mg | DELAYED_RELEASE_TABLET | Freq: Every day | ORAL | Status: DC
  Administered 2023-01-17 – 2023-01-18 (×2): 40 mg via ORAL
  Filled 2023-01-17 (×2): qty 1

## 2023-01-17 MED ORDER — ALPRAZOLAM 0.25 MG PO TABS
0.2500 mg | ORAL_TABLET | Freq: Three times a day (TID) | ORAL | Status: DC | PRN
  Administered 2023-01-17 (×2): 0.25 mg via ORAL
  Filled 2023-01-17 (×2): qty 1

## 2023-01-17 MED ORDER — POTASSIUM CHLORIDE CRYS ER 20 MEQ PO TBCR
40.0000 meq | EXTENDED_RELEASE_TABLET | Freq: Once | ORAL | Status: AC
  Administered 2023-01-17: 40 meq via ORAL
  Filled 2023-01-17: qty 2

## 2023-01-17 MED ORDER — INSULIN ASPART 100 UNIT/ML IJ SOLN
0.0000 [IU] | Freq: Three times a day (TID) | INTRAMUSCULAR | Status: DC
  Administered 2023-01-18: 2 [IU] via SUBCUTANEOUS
  Filled 2023-01-17: qty 1

## 2023-01-17 MED ORDER — ASPIRIN 81 MG PO CHEW
81.0000 mg | CHEWABLE_TABLET | Freq: Every day | ORAL | Status: DC
  Administered 2023-01-17 – 2023-01-18 (×2): 81 mg via ORAL
  Filled 2023-01-17 (×2): qty 1

## 2023-01-17 MED ORDER — MORPHINE SULFATE (PF) 2 MG/ML IV SOLN
2.0000 mg | INTRAVENOUS | Status: DC | PRN
  Administered 2023-01-17 (×2): 2 mg via INTRAVENOUS
  Filled 2023-01-17 (×3): qty 1

## 2023-01-17 MED ORDER — ONDANSETRON HCL 4 MG PO TABS: 4.0000 mg | ORAL_TABLET | Freq: Four times a day (QID) | ORAL | Status: DC | PRN

## 2023-01-17 MED ORDER — TRAZODONE HCL 50 MG PO TABS
50.0000 mg | ORAL_TABLET | Freq: Every day | ORAL | Status: DC
  Administered 2023-01-17: 50 mg via ORAL
  Filled 2023-01-17: qty 1

## 2023-01-17 MED ORDER — MORPHINE SULFATE (PF) 2 MG/ML IV SOLN
2.0000 mg | Freq: Once | INTRAVENOUS | Status: AC
  Administered 2023-01-17: 2 mg via INTRAVENOUS
  Filled 2023-01-17: qty 1

## 2023-01-17 MED ORDER — ACETAMINOPHEN 325 MG PO TABS: 650.0000 mg | ORAL_TABLET | Freq: Four times a day (QID) | ORAL | Status: DC | PRN

## 2023-01-17 MED ORDER — FUROSEMIDE 40 MG PO TABS: 40.0000 mg | ORAL_TABLET | Freq: Every day | ORAL | Status: DC

## 2023-01-17 MED ORDER — METOPROLOL SUCCINATE ER 25 MG PO TB24: 12.5000 mg | ORAL_TABLET | Freq: Every day | ORAL | Status: DC

## 2023-01-17 NOTE — ED Provider Notes (Signed)
[facility] [place] Provider Note    Event Date/Time   First MD Initiated Contact with Patient 01/16/23 2121     (approximate)   History   Chest Pain and Shortness of Breath   HPI  Deanna Joseph is a 57 y.o. female with history of CHF, T2DM, CKD, hyponatremia presenting to the emergency department for evaluation of shortness of breath and chest pain.  Patient was just discharged from the hospital yesterday.  During that time, she was admitted for worsening lower extremity for which she received diuresis and her hyponatremia improved with fluid restriction.  Since discharge, patient reports that she has had some recurrent shortness of breath as well as development of worsening chest pain.  Describes it as a sharp sensation in the center of her chest.  Has memory issues, so she is unsure if she has had pain like this previously.  Rates it as an 8 out of 10.  Husband says that this feels more severe than prior episodes.      Physical Exam   Triage Vital Signs: ED Triage Vitals  Encounter Vitals Group     BP 01/16/23 2026 (!) 146/63     Systolic BP Percentile --      Diastolic BP Percentile --      Pulse Rate 01/16/23 2026 (!) 103     Resp 01/16/23 2026 18     Temp 01/16/23 2026 98.3 F (36.8 C)     Temp src --      SpO2 01/16/23 2026 99 %     Weight 01/16/23 2020 155 lb 13.8 oz (70.7 kg)     Height 01/16/23 2020 5' (1.524 m)     Head Circumference --      Peak Flow --      Pain Score 01/16/23 2020 10     Pain Loc --      Pain Education --      Exclude from Growth Chart --     Most recent vital signs: Vitals:   01/16/23 2230 01/16/23 2300  BP: (!) 144/67 (!) 142/65  Pulse: 100 94  Resp: 14 15  Temp:    SpO2: 100% 100%     General: Awake, interactive  CV:  Regular rate, good peripheral perfusion, mild symmetric edema of the lower extremities Resp:  Lungs clear, unlabored respirations.  Abd:  Soft, nondistended.  Neuro:  Symmetric facial  movement, fluid speech   ED Results / Procedures / Treatments   Labs (all labs ordered are listed, but only abnormal results are displayed) Labs Reviewed  BASIC METABOLIC PANEL - Abnormal; Notable for the following components:      Result Value   Sodium 129 (*)    CO2 19 (*)    Glucose, Bld 299 (*)    BUN 46 (*)    Creatinine, Ser 1.37 (*)    Calcium 8.0 (*)    GFR, Estimated 45 (*)    All other components within normal limits  BRAIN NATRIURETIC PEPTIDE - Abnormal; Notable for the following components:   B Natriuretic Peptide 162.4 (*)    All other components within normal limits  CBC WITH DIFFERENTIAL/PLATELET - Abnormal; Notable for the following components:   RBC 3.09 (*)    Hemoglobin 9.7 (*)    HCT 28.6 (*)    Abs Immature Granulocytes 0.12 (*)    All other components within normal limits  HEPATIC FUNCTION PANEL - Abnormal; Notable for the following components:   Albumin 3.2 (*)  All other components within normal limits  TROPONIN I (HIGH SENSITIVITY) - Abnormal; Notable for the following components:   Troponin I (High Sensitivity) 26 (*)    All other components within normal limits  TROPONIN I (HIGH SENSITIVITY) - Abnormal; Notable for the following components:   Troponin I (High Sensitivity) 30 (*)    All other components within normal limits  SARS CORONAVIRUS 2 BY RT PCR  MAGNESIUM     EKG EKG independently reviewed interpreted by myself (ER attending) demonstrates:  EKG demonstrates sinus tachycardia rate of 100, PR 166, QRS 79, QTc 449, no acute ST changes  RADIOLOGY Imaging independently reviewed and interpreted by myself demonstrates:  CXR without focal consolidation  PROCEDURES:  Critical Care performed: No  Procedures   MEDICATIONS ORDERED IN ED: Medications  nitroGLYCERIN (NITROSTAT) SL tablet 0.4 mg (0.4 mg Sublingual Given 01/16/23 2305)  morphine (PF) 2 MG/ML injection 2 mg (has no administration in time range)  morphine (PF) 2 MG/ML  injection 2 mg (2 mg Intravenous Given 01/16/23 2224)  aspirin tablet 325 mg (325 mg Oral Given 01/16/23 2248)  acetaminophen (TYLENOL) tablet 650 mg (650 mg Oral Given 01/16/23 2331)     IMPRESSION / MDM / ASSESSMENT AND PLAN / ED COURSE  I reviewed the triage vital signs and the nursing notes.  Differential diagnosis includes, but is not limited to, worsening CHF, consideration for ACS, pneumonia, pneumothorax, lower suspicion pulmonary embolism given recent negative CTA of the chest  Patient's presentation is most consistent with acute presentation with potential threat to life or bodily function.  57 year old female presenting to the emergency department for evaluation of chest pain after recent discharge.  Has some lower extremity edema, but is satting appropriately on room air.  X- without evidence of significant volume overload.  Labs with stable creatinine, slightly worsening hyponatremia at 129.  Similar BNP at 162.  However, patient does have a new troponin elevation at 27, increased to 30 on repeat.  Was previously normal.  Patient treated with aspirin, nitro, morphine with brief improvement in chest pain, but does have recurrent chest pain now.  Given her ongoing chest pain and newly elevated troponin, will plan to discuss admission with hospitalist team.      FINAL CLINICAL IMPRESSION(S) / ED DIAGNOSES   Final diagnoses:  Acute chest pain     Rx / DC Orders   ED Discharge Orders     None        Note:  This document was prepared using Dragon voice recognition software and may include unintentional dictation errors.   Trinna Post, MD 01/17/23 [phone]

## 2023-01-17 NOTE — ED Notes (Signed)
Pt very anxious and states she feels "shaky and weird." Pt states she is concerned her bg is low and that she will pass out if she doesn't eat soon. RN explained to pt that her bg was high and would not pass out from not eating and that she cannot eat anything at this time due to testing. Emotional support provided. Husband stated that over the past week or so all she wants to do is eat non stop. Pt also c/o 4/10 dull cp with pain 8/10 headache and pain in all extremities.

## 2023-01-17 NOTE — ED Provider Notes (Signed)
-----------------------------------------   12:38 AM on 01/17/2023 -----------------------------------------  Consulted Dr. Arville Care with the hospitalist service who will admit the patient for ongoing episodic chest pain and elevated troponin level.   Loleta Rose, MD 01/17/23 [phone]

## 2023-01-17 NOTE — Assessment & Plan Note (Signed)
-   The patient will be placed on supplemental coverage with NovoLog. - Given pseudohyponatremia, he will be hydrated and BMP will be followed.

## 2023-01-17 NOTE — Consult Note (Signed)
[facility] CARDIOLOGY CONSULT NOTE       Patient ID: Deanna Joseph MRN: [id number] DOB/AGE: 12/25/1965 57 y.o.  Admit date: 01/16/2023 Referring Physician Dr. Valente David Primary Physician Dr. Hillery Aldo Primary Cardiologist Marijo Conception, NP  Reason for Consultation chest pain, shortness of breath  HPI: Deanna Joseph. Deanna Joseph is a 57yoF with a PMH of cognitive impairment, HFpEF (60-65%, g1DD 09/2022), CKD3, DM2 who presented to [facility] ED 01/17/2023 with chest pain, cardiology is consulted for further assistance.  Patient is well-known to our service from recent admission from 8/5 - 8/10 for acute on chronic HFpEF where she was diuresed for multiple days with IV Lasix with clinical improvement.  She presents today and history is obtained primarily from the chart as the patient cannot remember why she came to the hospital.  On presentation to the ED she apparently described her chest pain as a central sharp sensation associated with shortness of breath.  Unclear aggravating or relieving factors, or triggers.  At my time of evaluation this morning she tells me her fingers hurt, and she hurts from her head to her toes.  She feels like her "heart is pounding" when asked about her chest pain.  Throughout interview she is rather distracted and does not focus on other ROS questions.  She asks me if she can eat the food on her breakfast tray and if I am a cop.  She is alert and oriented to her name, but does not know the year, month, or why she is in the hospital.  Vitals are notable for a blood pressure of 133/56, heart rate documented at 94 bpm and she is not on telemetry.  Labs are notable for improving hyponatremia from 129-134, BUN/creatinine 37/1.09 and GFR of 60.  High-sensitivity troponin borderline elevated and flat trending at 26, 30, 19.  H&H stable at 9.2/27.6.  BNP borderline elevated at 162.  Chest x-ray is clear without pleural effusion, vascular congestion or evidence of active cardiopulmonary  disease.  Initial EKG demonstrates sinus tachycardia with rate 101, without T wave inversions or evidence of ischemia.   Review of systems complete and found to be negative unless listed above     Past Medical History:  Diagnosis Date   (HFpEF) heart failure with preserved ejection fraction (HCC)    Anemia due to chronic kidney disease    Chronic gastritis    CKD (chronic kidney disease), stage III (HCC)    Cognitive decline    DM2 (diabetes mellitus, type 2) (HCC)    Gastritis, Helicobacter pylori 2015   GERD (gastroesophageal reflux disease)    Wernicke-Korsakoff syndrome (HCC)     Past Surgical History:  Procedure Laterality Date   CESAREAN SECTION     CHOLECYSTECTOMY     DILATION AND CURETTAGE OF UTERUS     ESOPHAGOGASTRODUODENOSCOPY N/A 07/18/2019   Procedure: ESOPHAGOGASTRODUODENOSCOPY (EGD);  Surgeon: Toledo, Boykin Nearing, MD;  Location: [facility] ENDOSCOPY;  Service: Gastroenterology;  Laterality: N/A;    (Not in a hospital admission)  Social History   Socioeconomic History   Marital status: Married    Spouse name: Not on file   Number of children: Not on file   Years of education: Not on file   Highest education level: Not on file  Occupational History   Not on file  Tobacco Use   Smoking status: Never   Smokeless tobacco: Never  Substance and Sexual Activity   Alcohol use: Not Currently   Drug use: Not on file  Sexual activity: Not on file  Other Topics Concern   Not on file  Social History Narrative   Not on file   Social Determinants of Health   Financial Resource Strain: Low Risk  (02/26/2020)   [facility]   Overall Financial Resource Strain (CARDIA)    Difficulty of Paying Living Expenses: Not very hard  Recent Concern: Financial Resource Strain - Medium Risk (01/13/2020)   Received from [facility]   Overall Financial Resource Strain (CARDIA)    Difficulty of Paying Living Expenses: Somewhat hard  Food  Insecurity: No Food Insecurity (01/17/2023)   Hunger Vital Sign    Worried About Running Out of Food in the Last Year: Never true    Ran Out of Food in the Last Year: Never true  Transportation Needs: No Transportation Needs (01/17/2023)   PRAPARE - Administrator, Civil Service (Medical): No    Lack of Transportation (Non-Medical): No  Physical Activity: Not on file  Stress: Not on file  Social Connections: Not on file  Intimate Partner Violence: Not At Risk (01/17/2023)   Humiliation, Afraid, Rape, and Kick questionnaire    Fear of Current or Ex-Partner: No    Emotionally Abused: No    Physically Abused: No    Sexually Abused: No    Family History  Problem Relation Age of Onset   Breast cancer Maternal Aunt 55     No intake or output data in the 24 hours ending 01/17/23 0855   Vitals:   01/17/23 0530 01/17/23 0630 01/17/23 0649 01/17/23 0730  BP: (!) 156/61 (!) 153/68  (!) 151/76  Pulse: (!) 103 (!) 105  (!) 105  Resp: 19 (!) 23  19  Temp:  98.5 F (36.9 C)    TempSrc:   Oral   SpO2: 100% 99%  100%  Weight:      Height:        PHYSICAL EXAM General: middle aged female , well nourished, in no acute distress.  Sitting up in ED stretcher in hallway.  Eating breakfast, no family present HEENT:  Normocephalic and atraumatic. Neck:  No JVD.  Lungs: Normal respiratory effort on room air. Clear bilaterally to auscultation. No wheezes, crackles, rhonchi.  Heart: HRRR . Normal S1 and S2 without gallops or murmurs.  Abdomen: Non-distended appearing with excess adiposity.  Msk: Normal strength and tone for age. Extremities: Warm and well perfused. No clubbing, cyanosis.  1-2+ bilateral lower extremity edema.   Neuro: Alert and oriented to self, does not know the year, month, or why she is in the hospital. Psych: Tangential speech.  Labs: Basic Metabolic Panel: Recent Labs    01/16/23 2025 01/17/23 0411  NA 129* 134*  K 4.3 3.9  CL 98 106  CO2 19* 23  GLUCOSE  299* 148*  BUN 46* 37*  CREATININE 1.37* 1.09*  CALCIUM 8.0* 7.2*  MG 2.2  --    Liver Function Tests: Recent Labs    01/16/23 2025  AST 20  ALT 19  ALKPHOS 71  BILITOT 0.6  PROT 6.5  ALBUMIN 3.2*   No results for input(s): "LIPASE", "AMYLASE" in the last 72 hours. CBC: Recent Labs    01/16/23 2025 01/17/23 0411  WBC 9.0 8.9  NEUTROABS 6.7  --   HGB 9.7* 9.2*  HCT 28.6* 27.6*  MCV 92.6 93.6  PLT 346 310   Cardiac Enzymes: Recent Labs    01/16/23 2025 01/16/23 2205  TROPONINIHS 26* 30*   BNP: Recent Labs    01/16/23 2025  BNP 162.4*   D-Dimer: No results for input(s): "DDIMER" in the last 72 hours. Hemoglobin A1C: Recent Labs    01/15/23 1031  HGBA1C 6.7*   Fasting Lipid Panel: No results for input(s): "CHOL", "HDL", "LDLCALC", "TRIG", "CHOLHDL", "LDLDIRECT" in the last 72 hours. Thyroid Function Tests: Recent Labs    01/15/23 1031  TSH 2.992   Anemia Panel: No results for input(s): "VITAMINB12", "FOLATE", "FERRITIN", "TIBC", "IRON", "RETICCTPCT" in the last 72 hours.   Radiology: DG Chest 2 View  Result Date: 01/16/2023 CLINICAL DATA:  Weakness and hunger EXAM: CHEST - 2 VIEW COMPARISON:  Chest radiograph dated 01/10/2023 FINDINGS: Normal lung volumes. No focal consolidations. No pleural effusion or pneumothorax. The heart size and mediastinal contours are within normal limits. No acute osseous abnormality. IMPRESSION: No active cardiopulmonary disease. Electronically Signed   By: Agustin Cree M.D.   On: 01/16/2023 20:54   ECHOCARDIOGRAM LIMITED  Result Date: 01/12/2023    ECHOCARDIOGRAM LIMITED REPORT   Patient Name:   SOKHA CINCO Date of Exam: 01/11/2023 Medical Rec #:  [id number]       Height:       60.0 in Accession #:    [id number]      Weight:       189.0 lb Date of Birth:  June 11, 1965      BSA:          1.822 m Patient Age:    56 years        BP:           147/77 mmHg Patient Gender: F               HR:           76 bpm. Exam Location:  [facility]  Procedure: 2D Echo, Limited Echo, Limited Color Doppler and Cardiac Doppler Indications:     I50.31 Acute Diastolic CHF  History:         Patient has prior history of Echocardiogram examinations, most                  recent 10/01/2022. Risk Factors:Diabetes.  Sonographer:     Daphine Deutscher RDCS Referring Phys:  [id number] [facility] MICHELLE  Diagnosing Phys: Rozell Searing Custovic IMPRESSIONS  1. Left ventricular ejection fraction, by estimation, is 60 to 65%. Left ventricular ejection fraction by PLAX is 63 %. The left ventricle has normal function. The left ventricle has no regional wall motion abnormalities. Left ventricular diastolic parameters are consistent with Grade I diastolic dysfunction (impaired relaxation).  2. Right ventricular systolic function is normal. The right ventricular size is normal.  3. The mitral valve is grossly normal. No evidence of mitral valve regurgitation.  4. The aortic valve is grossly normal. Aortic valve regurgitation is mild to moderate. Aortic regurgitation PHT measures 372 msec. FINDINGS  Left Ventricle: Left ventricular ejection fraction, by estimation, is 60 to 65%. Left ventricular ejection fraction by PLAX is 63 %. The left ventricle has normal function. The left ventricle has no regional wall motion abnormalities. The left ventricular internal cavity size was normal in size. There is no left ventricular hypertrophy. Left ventricular diastolic parameters are consistent with Grade I diastolic dysfunction (impaired relaxation). Right Ventricle: The right ventricular size is normal. No increase in right ventricular wall thickness. Right ventricular systolic function is normal. Left Atrium: Left atrial size was normal in size. Right Atrium: Right atrial size was normal in  size. Pericardium: There is no evidence of pericardial effusion. Mitral Valve: The mitral valve is grossly normal. Tricuspid Valve: The tricuspid valve is grossly normal. Tricuspid valve regurgitation is  trivial. Aortic Valve: The aortic valve is grossly normal. Aortic valve regurgitation is mild to moderate. Aortic regurgitation PHT measures 372 msec. Pulmonic Valve: The pulmonic valve was grossly normal. Pulmonic valve regurgitation is not visualized. Aorta: The aortic root is normal in size and structure. IAS/Shunts: No atrial level shunt detected by color flow Doppler. LEFT VENTRICLE PLAX 2D LV EF:         Left            Diastology                ventricular     LV e' medial:    7.18 cm/s                ejection        LV E/e' medial:  11.7                fraction by     LV e' lateral:   11.75 cm/s                PLAX is 63      LV E/e' lateral: 7.1                %. LVIDd:         3.90 cm LVIDs:         2.60 cm LV PW:         0.60 cm LV IVS:        0.60 cm  RIGHT VENTRICLE RV S prime:     11.40 cm/s TAPSE (M-mode): 2.4 cm LEFT ATRIUM         Index LA diam:    3.30 cm 1.81 cm/m  AORTIC VALVE AI PHT:      372 msec  AORTA Ao Root diam: 3.10 cm MITRAL VALVE MV Area (PHT): 3.81 cm MV Decel Time: 199 msec MV E velocity: 83.85 cm/s MV A velocity: 100.50 cm/s MV E/A ratio:  0.83 Sabina Custovic Electronically signed by Clotilde Dieter Signature Date/Time: 01/12/2023/9:20:19 AM    Final    CT HEAD WO CONTRAST ( )  Result Date: 01/11/2023 CLINICAL DATA:  Altered mental status EXAM: CT HEAD WITHOUT CONTRAST TECHNIQUE: Contiguous axial images were obtained from the base of the skull through the vertex without intravenous contrast. RADIATION DOSE REDUCTION: This exam was performed according to the departmental dose-optimization program which includes automated exposure control, adjustment of the mA and/or kV according to patient size and/or use of iterative reconstruction technique. COMPARISON:  10/24/2022 FINDINGS: Brain: No acute intracranial abnormality. Specifically, no hemorrhage, hydrocephalus, mass lesion, acute infarction, or significant intracranial injury. Vascular: No hyperdense vessel or unexpected  calcification. Skull: No acute calvarial abnormality. Sinuses/Orbits: No acute findings Other: None IMPRESSION: No acute intracranial abnormality. Electronically Signed   By: Charlett Nose M.D.   On: 01/11/2023 00:04   DG Chest 2 View  Result Date: 01/10/2023 CLINICAL DATA:  Shortness of breath EXAM: CHEST - 2 VIEW COMPARISON:  Chest x-ray 01/06/2023 FINDINGS: The heart size and mediastinal contours are within normal limits. Both lungs are clear. Chronic left humeral neck fracture again noted. IMPRESSION: No active cardiopulmonary disease. Electronically Signed   By: Darliss Cheney M.D.   On: 01/10/2023 19:58   CT Angio Chest PE W/Cm &/Or Wo Cm  Result Date: 01/06/2023 CLINICAL DATA:  Severe chest pain. New onset CHF. Evaluate for pulmonary embolism. EXAM: CT ANGIOGRAPHY CHEST WITH CONTRAST TECHNIQUE: Multidetector CT imaging of the chest was performed using the standard protocol during bolus administration of intravenous contrast. Multiplanar CT image reconstructions and MIPs were obtained to evaluate the vascular anatomy. RADIATION DOSE REDUCTION: This exam was performed according to the departmental dose-optimization program which includes automated exposure control, adjustment of the mA and/or kV according to patient size and/or use of iterative reconstruction technique. CONTRAST:  75mL OMNIPAQUE IOHEXOL 350 MG/ML SOLN COMPARISON:  Chest radiograph-01/06/2023 FINDINGS: Vascular Findings: There is adequate opacification of the pulmonary arterial system with the main pulmonary artery measuring 433 Hounsfield units. There are no discrete filling defects within the pulmonary arterial tree to suggest pulmonary embolism. Normal caliber of the main pulmonary artery. Normal heart size. No pericardial effusion. Coronary artery calcifications. No evidence of thoracic aortic aneurysm or dissection on this nongated examination. Bovine configuration of the aortic arch. The branch vessels of the aortic arch appear widely  patent throughout their imaged courses. Review of the MIP images confirms the above findings. ---------------------------------------------------------------------------------- Nonvascular Findings: Mediastinum/Lymph Nodes: Scattered mediastinal lymph nodes are not enlarged by size criteria, presumably reactive in etiology. No bulky mediastinal, hilar or axillary lymphadenopathy. Lungs/Pleura: No focal airspace opacities. No pleural effusion or pneumothorax. The central pulmonary airways appear widely patent. Punctate granuloma are seen within the lungs bilaterally (images 45, 63 and 68, series 6). No discrete worrisome pulmonary nodules. Upper abdomen: Limited early arterial phase evaluation of the upper abdomen demonstrates mild nodularity of the hepatic contour as could be seen in the setting of early cirrhotic change. Postcholecystectomy. Small hiatal hernia. Debris is seen scattered throughout the esophagus without definitive evidence of esophageal wall thickening. Musculoskeletal: No acute or aggressive osseous abnormalities. Normal appearance of the thyroid. Regional soft tissues appear normal. IMPRESSION: 1. No acute cardiopulmonary disease. Specifically, no evidence of pulmonary embolism. 2. Coronary artery calcifications. Aortic Atherosclerosis (ICD10-I70.0). 3. Small hiatal hernia with debris scattered throughout the esophagus, nonspecific though could be seen in the setting of gastroesophageal reflux. 4. Mild nodularity of the hepatic contour as could be seen in the setting of early cirrhotic change. Correlation with LFTs is advised. Electronically Signed   By: Simonne Come M.D.   On: 01/06/2023 12:47   DG Chest 2 View  Result Date: 01/06/2023 CLINICAL DATA:  sob EXAM: CHEST - 2 VIEW COMPARISON:  CXR 01/06/23 FINDINGS: The heart size and mediastinal contours are within normal limits. Both lungs are clear. The visualized skeletal structures are unremarkable. Surgical clips in the right upper quadrant  IMPRESSION: No focal airspace opacity Electronically Signed   By: Lorenza Cambridge M.D.   On: 01/06/2023 10:16   DG Chest 1 View  Result Date: 01/06/2023 CLINICAL DATA:  Chest pain EXAM: CHEST  1 VIEW COMPARISON:  09/30/2022 FINDINGS: The heart size and mediastinal contours are within normal limits. Both lungs are clear. The visualized skeletal structures are unremarkable. IMPRESSION: No active disease. Electronically Signed   By: Jasmine Pang M.D.   On: 01/06/2023 01:39    ECHO 10/01/2022  1. Left ventricular ejection fraction, by estimation, is 60 to 65%. The  left ventricle has normal function. The left ventricle has no regional  wall motion abnormalities. Left ventricular diastolic parameters are  consistent with Grade I diastolic  dysfunction (impaired relaxation).   2. Right ventricular systolic function is normal. The right ventricular  size is normal. There is normal pulmonary artery systolic pressure. The  estimated  right ventricular systolic pressure is 23.5 mmHg.   3. The mitral valve is normal in structure. No evidence of mitral valve  regurgitation. No evidence of mitral stenosis.   4. The aortic valve was not well visualized. Aortic valve regurgitation  is not visualized. No aortic stenosis is present. Aortic valve area, by  VTI measures 2.81 cm.   5. The inferior vena cava is normal in size with greater than 50%  respiratory variability, suggesting right atrial pressure of 3 mmHg.   TELEMETRY reviewed by me (LT) 01/17/2023 : Sinus rhythm rate 80s to 90s with occasional premature atrial contractions.  EKG reviewed by me: NSR 98 PAC  Data reviewed by me (LT) 01/17/2023: Last ED note from 8/1, ED note from 8/5, admission H&P last 24h vitals tele labs imaging I/O   Principal Problem:   Chest pain Active Problems:   Uncontrolled type 2 diabetes mellitus with hyperglycemia, without long-term current use of insulin (HCC)   Stage 3a chronic kidney disease (CKD) (HCC)   GERD  without esophagitis    ASSESSMENT AND PLAN:  Deanna Joseph is a 56yoF with a PMH of cognitive impairment, HFpEF (60-65%, g1DD 09/2022), CKD3, DM2 who presented to [facility] ED 01/17/2023 with chest pain, cardiology is consulted for further assistance.   # Symptomatic palpitations # Demand ischemia Presents with chest pain she describes as "heart pounding".  EKG demonstrates sinus rhythm and sinus tachycardia without concerning for ischemia or infarction.  Troponins are borderline elevated and flat trending at 26, 30, 19 which is inconsistent with ACS and most consistent with HFpEF as below. -No plan for invasive cardiac diagnostics or ischemic workup at this time. -Increase metoprolol XL from 12.5 mg to 25 mg daily  # Acute on chronic HFpEF Clinically hypervolemic with 1-2+ lower extremity edema, but without pulmonary edema or pleural effusions on CXR -give IV lasix 60mg  this AM, then start 40mg  PO lasix twice daily tomorrow.  -continue metoprolol succinate 25 mg daily -Maintain K >4, mag >2  # CKD 3 Monitor closely with diuresis.  Cardiology will sign off. Please haiku with questions or re-engage if needed.    This patient's plan of care was discussed and created with Dr. [name] and he is in agreement.  Signed: Rebeca Allegra , PA-C 01/17/2023, 8:55 AM [facility] Cardiology

## 2023-01-17 NOTE — ED Notes (Signed)
Pt continues to request to eat and have something to drink. Pt reeducated on npo status. Pt states she is worried she will die if she doesn't get something to eat. Pt provided emotional support and reassured that would not happen.

## 2023-01-17 NOTE — Evaluation (Signed)
Occupational Therapy Evaluation Patient Details Name: Deanna Joseph MRN: [id number] DOB: 06/27/1965 Today's Date: 01/17/2023   History of Present Illness Pt is a 57 y.o. female presenting to hospital 01/16/23 with SOB and chest pain (was just discharged from hospital day prior).  Pt admitted with chest pain and uncontrolled DM with hyperglycemia.  PMH includes HFpEF, stage III CKD, DM, H. Pylori, Wernicke-Korsakoff syndrome.   Clinical Impression   Deanna Joseph was seen for OT evaluation this date. Prior to hospital admission, pt was MOD I for household mobility and PRN assist for ADLs. Pt lives with spouse. Pt currently requires CGA doff brief and perform pericare in standing. SUPERVISION doff gown in sitting. CGA + HHA for simulated toilet t/f. Per spouse pt at baseline cognition, session limited by STM deficits - pt repeatedly asking for something out of the fridge in the corner despite re-orienting to being in an ED room. Pt would benefit from skilled OT to address noted impairments and functional limitations (see below for any additional details). Upon hospital discharge, recommend no OT follow up.    If plan is discharge home, recommend the following: Supervision due to cognitive status;A little help with bathing/dressing/bathroom;Assist for transportation;Assistance with cooking/housework;A little help with walking and/or transfers    Functional Status Assessment  Patient has had a recent decline in their functional status and demonstrates the ability to make significant improvements in function in a reasonable and predictable amount of time.  Equipment Recommendations  BSC/3in1    Recommendations for Other Services       Precautions / Restrictions Precautions Precautions: Fall Restrictions Weight Bearing Restrictions: No      Mobility Bed Mobility Overal bed mobility: Needs Assistance Bed Mobility: Sit to Supine       Sit to supine: Supervision         Transfers Overall transfer level: Needs assistance Equipment used: Straight cane Transfers: Sit to/from Stand Sit to Stand: Contact guard assist                  Balance Overall balance assessment: Needs assistance Sitting-balance support: No upper extremity supported, Feet supported Sitting balance-Leahy Scale: Good     Standing balance support: No upper extremity supported, During functional activity Standing balance-Leahy Scale: Fair                             ADL either performed or assessed with clinical judgement   ADL Overall ADL's : Needs assistance/impaired                                       General ADL Comments: CGA doff brief and perform pericare in standing. SUPERVISION doff gown in sitting. CGA + HHA for simulated toilet t/f.       Pertinent Vitals/Pain Pain Assessment Pain Assessment: No/denies pain     Extremity/Trunk Assessment Upper Extremity Assessment Upper Extremity Assessment: Generalized weakness LUE Deficits / Details: April 2024 LUE fx, cleared from sling last month   Lower Extremity Assessment Lower Extremity Assessment: Generalized weakness       Communication Communication Communication: Difficulty following commands/understanding Following commands: Follows one step commands with increased time   Cognition Arousal: Alert Behavior During Therapy: WFL for tasks assessed/performed, Anxious Overall Cognitive Status: History of cognitive impairments - at baseline  General Comments: STM deficits, pt repeatedly asking for something out of the fridge in the corner despite re-orienting to being in an ED room.                Home Living Family/patient expects to be discharged to:: Private residence Living Arrangements: Spouse/significant other Available Help at Discharge: Family;Available PRN/intermittently Type of Home: House Home Access: Level  entry     Home Layout: One level     Bathroom Shower/Tub: Chief Strategy Officer: Handicapped height Bathroom Accessibility: Yes   Home Equipment: Rollator (4 wheels);Cane - single point;Shower seat   Additional Comments: pt also reports staying at [employer] however poor historian      Prior Functioning/Environment Prior Level of Function : Needs assist;History of Falls (last six months)             Mobility Comments: Requires assist to walk limited community distances with Rollator. Uses motorized shopping carts as needed in stores.  Pt reports also using SPC at home. ADLs Comments: Spouse assists when needed with most ADL's depending on the day.  Independent with toileting        OT Problem List: Decreased strength;Decreased activity tolerance;Decreased safety awareness;Decreased range of motion;Decreased cognition      OT Treatment/Interventions: Self-care/ADL training;Therapeutic exercise;Energy conservation;DME and/or AE instruction;Therapeutic activities    OT Goals(Current goals can be found in the care plan section) Acute Rehab OT Goals Patient Stated Goal: to go home OT Goal Formulation: With patient/family Time For Goal Achievement: 01/31/23 Potential to Achieve Goals: Good ADL Goals Pt Will Perform Grooming: with modified independence;standing Pt Will Perform Lower Body Dressing: sit to/from stand;with set-up;with supervision Pt Will Transfer to Toilet: with modified independence;ambulating;regular height toilet  OT Frequency: Min 1X/week    Co-evaluation              AM-PAC OT "6 Clicks" Daily Activity     Outcome Measure Help from another person eating meals?: None Help from another person taking care of personal grooming?: A Little Help from another person toileting, which includes using toliet, bedpan, or urinal?: A Little Help from another person bathing (including washing, rinsing, drying)?: A Little Help from another person to put on  and taking off regular upper body clothing?: None Help from another person to put on and taking off regular lower body clothing?: A Little 6 Click Score: 20   End of Session    Activity Tolerance: Patient tolerated treatment well Patient left: in bed;with call bell/phone within reach;with bed alarm set;with family/visitor present  OT Visit Diagnosis: Unsteadiness on feet (R26.81);Muscle weakness (generalized) (M62.81)                Time: 1350-1407 OT Time Calculation (min): 17 min Charges:  OT General Charges $OT Visit: 1 Visit OT Evaluation $OT Eval Low Complexity: 1 Low  Kathie Dike, M.S. OTR/L  01/17/23, 2:51 PM  ascom [phone]

## 2023-01-17 NOTE — H&P (Signed)
[facility]   PATIENT NAME: Deanna Joseph    MR#:  [id number]  DATE OF BIRTH:  25-Jul-1965  DATE OF ADMISSION:  01/16/2023  PRIMARY CARE PHYSICIAN: Hillery Aldo, MD   Patient is coming from: Home  REQUESTING/REFERRING PHYSICIAN: Loleta Rose MD  CHIEF COMPLAINT:   Chief Complaint  Patient presents with   Chest Pain   Shortness of Breath    HISTORY OF PRESENT ILLNESS:  ABEER Joseph is a 57 y.o. Caucasian female with medical history significant for HFpEF, stage III chronic kidney disease, type diabetes mellitus, GERD, H. pylori, who presented to the emergency room with acute onset of 10/10 midsternal chest pain felt as tightness with associated diaphoresis without nausea or vomiting.  She admitted to associated dyspnea and palpitations with her pain.  She has been having mild lower extremity edema and occasional pain.  No fever or chills.  No cough or wheezing or hemoptysis.  No diarrhea or melena or bright red bleeding per rectum.  No dysuria, oliguria or hematuria or flank pain.  ED Course: When she came to the ER, BP was 146/63 with heart rate 103 and later 95 with otherwise normal vital signs.  Labs revealed hyponatremia 129 with CO2 19 blood glucose of 299 and BUN 46 with creatinine 1.37 and albumin 3.2 calcium 8.  BNP was 162.4 and high sensitive troponin I was 26 and later 30.  CBC showed anemia slightly worse than previous levels.   EKG as reviewed by me :  EKG showed sinus tachycardia with rate 101 Imaging: 2 view chest x-ray showed no acute cardiopulmonary disease.  The patient was given 650 mg of p.o. Tylenol, 325 mg p.o. aspirin and 2 mg of IV morphine sulfate and was ordered as needed sublingual nitroglycerin.  She will be admitted to an observation cardiac telemetry bed for further evaluation and management. PAST MEDICAL HISTORY:   Past Medical History:  Diagnosis Date   (HFpEF) heart failure with preserved ejection fraction (HCC)    Anemia due to chronic  kidney disease    Chronic gastritis    CKD (chronic kidney disease), stage III (HCC)    Cognitive decline    DM2 (diabetes mellitus, type 2) (HCC)    Gastritis, Helicobacter pylori 2015   GERD (gastroesophageal reflux disease)    Wernicke-Korsakoff syndrome (HCC)     PAST SURGICAL HISTORY:   Past Surgical History:  Procedure Laterality Date   CESAREAN SECTION     CHOLECYSTECTOMY     DILATION AND CURETTAGE OF UTERUS     ESOPHAGOGASTRODUODENOSCOPY N/A 07/18/2019   Procedure: ESOPHAGOGASTRODUODENOSCOPY (EGD);  Surgeon: Toledo, Boykin Nearing, MD;  Location: [facility] ENDOSCOPY;  Service: Gastroenterology;  Laterality: N/A;    SOCIAL HISTORY:   Social History   Tobacco Use   Smoking status: Never   Smokeless tobacco: Never  Substance Use Topics   Alcohol use: Not Currently    FAMILY HISTORY:   Family History  Problem Relation Age of Onset   Breast cancer Maternal Aunt 55    DRUG ALLERGIES:   Allergies  Allergen Reactions   Sulfa Antibiotics Hives   Codeine Hives    REVIEW OF SYSTEMS:   ROS As per history of present illness. All pertinent systems were reviewed above. Constitutional, HEENT, cardiovascular, respiratory, GI, GU, musculoskeletal, neuro, psychiatric, endocrine, integumentary and hematologic systems were reviewed and are otherwise negative/unremarkable except for positive findings mentioned above in the HPI.   MEDICATIONS AT HOME:   Prior to Admission medications  Medication Sig Start Date End Date Taking? Authorizing Provider  acetaminophen (TYLENOL) 500 MG tablet Take 500 mg by mouth every 6 (six) hours as needed.   Yes [provider]  furosemide (LASIX) 40 MG tablet Take 1 tablet (40 mg total) by mouth daily. 01/16/23  Yes Esaw Grandchild A, DO  metoprolol succinate (TOPROL-XL) 25 MG 24 hr tablet Take 0.5 tablets (12.5 mg total) by mouth daily. 01/16/23  Yes Esaw Grandchild A, DO  omeprazole (PRILOSEC) 40 MG capsule Take 40 mg by mouth daily as  needed.   Yes [provider]  traZODone (DESYREL) 50 MG tablet Take 50 mg by mouth at bedtime.   Yes [provider]  feeding supplement, ENSURE ENLIVE, (ENSURE ENLIVE) LIQD Take 237 mLs by mouth 3 (three) times daily between meals. 07/30/19   Darlin Priestly, MD  traMADol (ULTRAM) 50 MG tablet Take 50 mg by mouth every 6 (six) hours as needed for moderate pain. 12/16/22   [provider]      VITAL SIGNS:  Blood pressure (!) 142/65, pulse 94, temperature 98.3 F (36.8 C), resp. rate 15, height 5' (1.524 m), weight 70.7 kg, SpO2 100%.  PHYSICAL EXAMINATION:  Physical Exam  GENERAL:  57 y.o.-year-old Caucasian female patient lying in the bed with no acute distress.  EYES: Pupils equal, round, reactive to light and accommodation. No scleral icterus. Extraocular muscles intact.  HEENT: Head atraumatic, normocephalic. Oropharynx and nasopharynx clear.  NECK:  Supple, no jugular venous distention. No thyroid enlargement, no tenderness.  LUNGS: Normal breath sounds bilaterally, no wheezing, rales,rhonchi or crepitation. No use of accessory muscles of respiration.  CARDIOVASCULAR: Regular rate and rhythm, S1, S2 normal. No murmurs, rubs, or gallops.  ABDOMEN: Soft, nondistended, nontender. Bowel sounds present. No organomegaly or mass.  EXTREMITIES: 1+ bilateral lower extremity pitting edema, with no cyanosis, or clubbing.  NEUROLOGIC: Cranial nerves II through XII are intact. Muscle strength 5/5 in all extremities. Sensation intact. Gait not checked.  PSYCHIATRIC: The patient is alert and oriented x 3.  Normal affect and good eye contact. SKIN: No obvious rash, lesion, or ulcer.   LABORATORY PANEL:   CBC Recent Labs  Lab 01/16/23 2025  WBC 9.0  HGB 9.7*  HCT 28.6*  PLT 346   ------------------------------------------------------------------------------------------------------------------  Chemistries  Recent Labs  Lab 01/16/23 2025  NA 129*  K 4.3  CL 98   CO2 19*  GLUCOSE 299*  BUN 46*  CREATININE 1.37*  CALCIUM 8.0*  MG 2.2  AST 20  ALT 19  ALKPHOS 71  BILITOT 0.6   ------------------------------------------------------------------------------------------------------------------  Cardiac Enzymes No results for input(s): "TROPONINI" in the last 168 hours. ------------------------------------------------------------------------------------------------------------------  RADIOLOGY:  DG Chest 2 View  Result Date: 01/16/2023 CLINICAL DATA:  Weakness and hunger EXAM: CHEST - 2 VIEW COMPARISON:  Chest radiograph dated 01/10/2023 FINDINGS: Normal lung volumes. No focal consolidations. No pleural effusion or pneumothorax. The heart size and mediastinal contours are within normal limits. No acute osseous abnormality. IMPRESSION: No active cardiopulmonary disease. Electronically Signed   By: Agustin Cree M.D.   On: 01/16/2023 20:54      IMPRESSION AND PLAN:  Assessment and Plan: * Chest pain - The patient will be admitted to an observation cardiac telemetry bed. - Will follow serial troponins and EKGs. - The patient will be placed on aspirin as well as p.r.n. sublingual nitroglycerin and morphine sulfate for pain. - We will obtain a cardiology consult in a.m. for further cardiac risk stratification. - I notified Dr.  Callwood about the patient.   Uncontrolled type 2 diabetes mellitus with hyperglycemia, without long-term current use of insulin (HCC) - The patient will be placed on supplemental coverage with NovoLog. - Given pseudohyponatremia, he will be hydrated and BMP will be followed.  GERD without esophagitis - We will continue PPI therapy.  Stage 3a chronic kidney disease (CKD) (HCC) - She will be placed on hydration with IV normal saline and will follow BMP.    DVT prophylaxis: Lovenox.  Advanced Care Planning:  Code Status: full code.  Family Communication:  The plan of care was discussed in details with the patient (and  family). I answered all questions. The patient agreed to proceed with the above mentioned plan. Further management will depend upon hospital course. Disposition Plan: Back to previous home environment Consults called: Cardiology All the records are reviewed and case discussed with ED provider.  Status is: Observation  I certify that at the time of admission, it is my clinical judgment that the patient will require hospital care extending less than 2 midnights.                            Dispo: The patient is from: Home              Anticipated d/c is to: Home              Patient currently is not medically stable to d/c.              Difficult to place patient: No  Hannah Beat M.D on 01/17/2023 at 3:38 AM  [facility]   From 7 PM-7 AM, contact night-coverage [email or web address]  CC: Primary care physician; Hillery Aldo, MD

## 2023-01-17 NOTE — Evaluation (Signed)
Physical Therapy Evaluation Patient Details Name: Deanna Joseph MRN: [id number] DOB: 07/21/1965 Today's Date: 01/17/2023  History of Present Illness  Pt is a 57 y.o. female presenting to hospital 01/16/23 with SOB and chest pain (was just discharged from hospital day prior).  Pt admitted with chest pain and uncontrolled DM with hyperglycemia.  PMH includes HFpEF, stage III CKD, DM, H. Pylori, Wernicke-Korsakoff syndrome.  Clinical Impression  Prior to hospital admission, pt was ambulatory; lives with her husband.  Currently pt is SBA semi-supine to sitting EOB; CGA with transfers; and CGA to ambulate 20 feet with UE support.  No c/o pain during session.  Pt would currently benefit from skilled PT to address noted impairments and functional limitations (see below for any additional details).  Upon hospital discharge, pt would benefit from ongoing therapy.     If plan is discharge home, recommend the following: A little help with walking and/or transfers;A little help with bathing/dressing/bathroom;Assistance with cooking/housework;Assist for transportation   Can travel by private vehicle    Yes    Equipment Recommendations None recommended by PT  Recommendations for Other Services       Functional Status Assessment Patient has had a recent decline in their functional status and demonstrates the ability to make significant improvements in function in a reasonable and predictable amount of time.     Precautions / Restrictions Precautions Precautions: Fall Restrictions Weight Bearing Restrictions: No      Mobility  Bed Mobility Overal bed mobility: Needs Assistance Bed Mobility: Supine to Sit     Supine to sit: Supervision, HOB elevated     General bed mobility comments: vc's for technique    Transfers Overall transfer level: Needs assistance Equipment used: Straight cane Transfers: Sit to/from Stand Sit to Stand: Contact guard assist           General transfer  comment: mild increased effort to stand from bed    Ambulation/Gait Ambulation/Gait assistance: Contact guard assist Gait Distance (Feet):  (20) Assistive device: Straight cane (pt resting L hand on telemetry pole) Gait Pattern/deviations: Step-through pattern Gait velocity: decreased     General Gait Details: steady ambulation; vc's for safety  Stairs            Wheelchair Mobility     Tilt Bed    Modified Rankin (Stroke Patients Only)       Balance Overall balance assessment: Needs assistance Sitting-balance support: No upper extremity supported, Feet supported Sitting balance-Leahy Scale: Good Sitting balance - Comments: steady reaching within BOS   Standing balance support: Reliant on assistive device for balance, During functional activity, Bilateral upper extremity supported Standing balance-Leahy Scale: Fair Standing balance comment: no loss of balance with standing activities                             Pertinent Vitals/Pain Pain Assessment Pain Assessment: No/denies pain Pain Intervention(s): Limited activity within patient's tolerance, Monitored during session, Repositioned Vitals (HR and SpO2 on room air) stable and WFL throughout treatment session.    Home Living Family/patient expects to be discharged to:: Private residence Living Arrangements: Spouse/significant other Available Help at Discharge: Family;Available PRN/intermittently Type of Home: House Home Access: Level entry       Home Layout: One level Home Equipment: Rollator (4 wheels);Cane - single point;Shower seat      Prior Function Prior Level of Function : Needs assist;History of Falls (last six months)  Mobility Comments: Requires assist to walk limited community distances with Rollator. Uses motorized shopping carts as needed in stores.  Pt reports also using SPC at home. ADLs Comments: Spouse assists when needed with most ADL's depending on the day.   Independent with toileting     Extremity/Trunk Assessment   Upper Extremity Assessment Upper Extremity Assessment: Defer to OT evaluation LUE Deficits / Details: From recent admission (per chart), pt's spouse reported pt was cleared from sling a couple weeks ago    Lower Extremity Assessment Lower Extremity Assessment: Generalized weakness       Communication      Cognition Arousal: Alert Behavior During Therapy: WFL for tasks assessed/performed Overall Cognitive Status: No family/caregiver present to determine baseline cognitive functioning                                 General Comments: Oriented to name, DOB, and month only.  Pt reported she was at the [facility].  Per chart pt with baseline STM deficitis.  Pt requires consistent cues/redirection for activities during session.        General Comments  Nursing cleared pt for participation in physical therapy.  Pt agreeable to PT session.    Exercises     Assessment/Plan    PT Assessment Patient needs continued PT services  PT Problem List Decreased strength;Decreased activity tolerance;Decreased balance;Decreased mobility       PT Treatment Interventions DME instruction;Gait training;Balance training;Functional mobility training;Therapeutic activities;Therapeutic exercise;Patient/family education    PT Goals (Current goals can be found in the Care Plan section)  Acute Rehab PT Goals Patient Stated Goal: to improve walking PT Goal Formulation: With patient Time For Goal Achievement: 01/31/23 Potential to Achieve Goals: Fair    Frequency Min 1X/week     Co-evaluation               AM-PAC PT "6 Clicks" Mobility  Outcome Measure Help needed turning from your back to your side while in a flat bed without using bedrails?: A Little Help needed moving from lying on your back to sitting on the side of a flat bed without using bedrails?: A Little Help needed moving to and from a bed to a chair  (including a wheelchair)?: A Little Help needed standing up from a chair using your arms (e.g., wheelchair or bedside chair)?: A Little Help needed to walk in hospital room?: A Little Help needed climbing 3-5 steps with a railing? : A Little 6 Click Score: 18    End of Session Equipment Utilized During Treatment: Gait belt Activity Tolerance: Patient tolerated treatment well Patient left:  (sitting on EOB with OT present for OT evaluation) Nurse Communication: Mobility status;Precautions PT Visit Diagnosis: Unsteadiness on feet (R26.81);Muscle weakness (generalized) (M62.81)    Time: 1610-9604 PT Time Calculation (min) (ACUTE ONLY): 24 min   Charges:   PT Evaluation $PT Eval Low Complexity: 1 Low PT Treatments $Therapeutic Activity: 8-22 mins PT General Charges $$ ACUTE PT VISIT: 1 Visit        Hendricks Limes, PT 01/17/23, 2:23 PM

## 2023-01-17 NOTE — ED Notes (Signed)
Pt states she doesn't want anymore nitroglycerin d/t it making her headache worse. MD notified. CP now 3/10, but the rest of her pain is 6/10.

## 2023-01-17 NOTE — Assessment & Plan Note (Signed)
-   She will be placed on hydration with IV normal saline and will follow BMP.

## 2023-01-17 NOTE — ED Notes (Addendum)
Pt continues to request to eat and have something to drink. Pt reeducated on npo status. Pt states she is worried she will die if she doesn't get something to eat. Pt provided emotional support and reassured that would not happen.

## 2023-01-17 NOTE — Assessment & Plan Note (Signed)
 Cardiology was consulted due to EKG changes.  Troponin remain negative.  Stress testing was negative for any significant ischemia.  Lipid panel with mildly elevated triglyceride and LDL of 100 with goal less than 70. She was started on aspirin and Crestor.

## 2023-01-17 NOTE — Progress Notes (Signed)
Brief hospitalist update note.  This is a nonbillable note.  Please see same-day H&P for full billable details.  Briefly, this is a 57 year old female history significant for heart failure with preserved ejection fraction, stage IIIa chronic kidney disease, diabetes, GERD, who presents to the ED for evaluation of midsternal chest pain associated with diaphoresis.  Denied nausea or vomiting.  Troponins were minimally elevated on arrival triggering hospitalist consult for admission.  Placing patient under observation status.  Case discussed with cardiology.  No plans for ischemic evaluation during this admission.  Suspect that symptoms of chest pain were most likely related to rate and possibly mild acute decompensated diastolic heart failure.  Titrating beta-blocker dose to achieve heart rate control.  Received small dose of IV Lasix for lower extremity edema and possible acute on chronic HFpEF.  Will engage PT and OT.  Anticipate discharge in 24 hours  Lolita Patella MD  No charge

## 2023-01-17 NOTE — Assessment & Plan Note (Signed)
-   We will continue PPI therapy 

## 2023-01-17 NOTE — ED Notes (Signed)
Pt states her cp is 4/10 and dull. Pt c/o 8/10 headache and pain in all extremities. Pt very anxious and says she feels "werid and shaky."

## 2023-01-18 DIAGNOSIS — R079 Chest pain, unspecified: Secondary | ICD-10-CM | POA: Diagnosis not present

## 2023-01-18 LAB — GLUCOSE, CAPILLARY
Glucose-Capillary: 126 mg/dL — ABNORMAL HIGH (ref 70–99)
Glucose-Capillary: 154 mg/dL — ABNORMAL HIGH (ref 70–99)
Glucose-Capillary: 140 mg/dL — ABNORMAL HIGH (ref 70–99)
Glucose-Capillary: 124 mg/dL — ABNORMAL HIGH (ref 70–99)

## 2023-01-18 MED ORDER — BUSPIRONE HCL 10 MG PO TABS
5.0000 mg | ORAL_TABLET | Freq: Three times a day (TID) | ORAL | Status: DC
  Administered 2023-01-18: 5 mg via ORAL
  Filled 2023-01-18: qty 1

## 2023-01-18 MED ORDER — METOPROLOL SUCCINATE ER 25 MG PO TB24: 25.0000 mg | ORAL_TABLET | Freq: Every day | ORAL | 1 refills | Status: DC

## 2023-01-18 MED ORDER — BUSPIRONE HCL 5 MG PO TABS: ORAL_TABLET | ORAL | 0 refills | Status: AC

## 2023-01-18 MED ORDER — FUROSEMIDE 40 MG PO TABS: 40.0000 mg | ORAL_TABLET | Freq: Two times a day (BID) | ORAL | 1 refills | Status: DC

## 2023-01-18 NOTE — Discharge Summary (Signed)
Physician Discharge Summary  Deanna Joseph:[id number] DOB: 1966-04-24 DOA: 01/16/2023  PCP: Hillery Aldo, MD  Admit date: 01/16/2023 Discharge date: 01/18/2023  Admitted From: Home Disposition:  Home with home health  Recommendations for Outpatient Follow-up:  Follow up with PCP in 1-2 weeks Seek outpatient psychiatric services  Home Health: Yes PT Equipment/Devices: None  Discharge Condition: Stable CODE STATUS: Full Diet recommendation: Regular  Brief/Interim Summary:   57 year old female history significant for heart failure with preserved ejection fraction, stage IIIa chronic kidney disease, diabetes, GERD, who presents to the ED for evaluation of midsternal chest pain associated with diaphoresis.  Denied nausea or vomiting.   Troponins were minimally elevated on arrival triggering hospitalist consult for admission.  Placing patient under observation status.  Case discussed with cardiology.  No plans for ischemic evaluation during this admission.  Suspect that symptoms of chest pain were most likely related to rate and possibly mild acute decompensated diastolic heart failure.   Titrating beta-blocker dose to achieve heart rate control.  Received small dose of IV Lasix for lower extremity edema and possible acute on chronic HFpEF.  8/13.  Increase in beta-blocker dose appears to been effective.  Heart rate improved.  Troponins minimally elevated.  Not indicative of ACS.  No plans for ischemic evaluation during this admission.  Stable for discharge home at this time.  Increased dose of beta-blocker and Lasix prescribed on DC  Of note to the patient's husband at bedside at the time of my discharge discussion expressed great concern about patient seemingly insatiable appetite.  States she wants to eat constantly and drinks too much water.  He asks if there is any medication I can use to decrease this.  I explained to him at length that I believe that this is a psychiatric  diagnosis and the patient ideally needs cognitive behavioral therapy and a thorough evaluation by a qualified outpatient psychiatric provider.  I offered to place ambulatory referrals for outpatient psychology or psychiatry.  I explained that the community of [place] likely would not have the appropriate resources to effectively address and treat patient's apparent polydipsia and polyphagia.  Despite my explanation and recommendations the patient's husband is frustrated and states that he cannot deal with her like this at home.  I explained to him again that the hospital does not have the resources and outpatient follow-up is recommended.  He declined my offer for an ambulatory referral to outpatient psychiatric services.   Discharge Diagnoses:  Principal Problem:   Chest pain Active Problems:   Uncontrolled type 2 diabetes mellitus with hyperglycemia, without long-term current use of insulin (HCC)   GERD without esophagitis   Stage 3a chronic kidney disease (CKD) (HCC)  Atypical chest pain Symptomatic palpitations Troponin elevation Chest pain described as "heart pounding" EKG with sinus rhythm/sinus tachycardia.  Troponins minimally elevated.  No significant delta.  Inconsistent with ACS.  No plans for ischemic evaluation.  On discharge will increase metoprolol from 12.5 mg to 25 mg daily.  Also likely mild acute on chronic heart failure with preserved ejection fraction.  1-2+ lower extremity edema.  No pulmonary edema or pleural effusions.  Received 60 mg IV Lasix.  Will start 40 mg p.o. twice daily on discharge.  Polydipsia/polyphagia Lengthy discussion with patient and husband on the day of discharge.  I explained that this is likely underlying psychiatric diagnosis and needs to be thoroughly evaluated by a outpatient psychiatric provider before an official diagnosis is made or any treatment is offered.  Husband  was frustrated with this explanation.  I offered referral to outpatient  psychiatric services which she declined stating that he was unable to leave the [place] area.  It is likely that in order to find a specialist qualified to treat this patient that he would need to leave the  [place]  He declined to do so for reasons that are unclear to me.  Of note the patient is established with Dr. Allena Katz at [facility] clinic.  Review of care everywhere does not show any recent visits.  The patient has however had multiple ER visits.  Discharge Instructions  Discharge Instructions     Diet - low sodium heart healthy   Complete by: As directed    Increase activity slowly   Complete by: As directed       Allergies as of 01/18/2023       Reactions   Sulfa Antibiotics Hives   Codeine Hives        Medication List     TAKE these medications    acetaminophen 500 MG tablet Commonly known as: TYLENOL Take 500 mg by mouth every 6 (six) hours as needed.   busPIRone 5 MG tablet Commonly known as: BUSPAR Take 1 tablet (5 mg total) by mouth 3 (three) times daily for 7 days, THEN 1.5 tablets (7.5 mg total) 3 (three) times daily for 14 days. Start taking on: January 18, 2023   feeding supplement Liqd Take 237 mLs by mouth 3 (three) times daily between meals.   furosemide 40 MG tablet Commonly known as: LASIX Take 1 tablet (40 mg total) by mouth 2 (two) times daily. What changed: when to take this   metoprolol succinate 25 MG 24 hr tablet Commonly known as: TOPROL-XL Take 1 tablet (25 mg total) by mouth daily. What changed: how much to take   omeprazole 40 MG capsule Commonly known as: PRILOSEC Take 40 mg by mouth daily as needed.   traMADol 50 MG tablet Commonly known as: ULTRAM Take 50 mg by mouth every 6 (six) hours as needed for moderate pain.   traZODone 50 MG tablet Commonly known as: DESYREL Take 50 mg by mouth at bedtime.        Follow-up Information     Hillery Aldo, MD. Go in 1 week(s).    Specialty: Family Medicine Why: Appointment on Monday, 01/24/2023 at 9:00am. Contact information: [address] [place] [address] [phone]                Allergies  Allergen Reactions   Sulfa Antibiotics Hives   Codeine Hives    Consultations: Cardiology   Procedures/Studies: DG Chest 2 View  Result Date: 01/16/2023 CLINICAL DATA:  Weakness and hunger EXAM: CHEST - 2 VIEW COMPARISON:  Chest radiograph dated 01/10/2023 FINDINGS: Normal lung volumes. No focal consolidations. No pleural effusion or pneumothorax. The heart size and mediastinal contours are within normal limits. No acute osseous abnormality. IMPRESSION: No active cardiopulmonary disease. Electronically Signed   By: Agustin Cree M.D.   On: 01/16/2023 20:54   ECHOCARDIOGRAM LIMITED  Result Date: 01/12/2023    ECHOCARDIOGRAM LIMITED REPORT   Patient Name:   Deanna Joseph Date of Exam: 01/11/2023 Medical Rec #:  [id number]       Height:       60.0 in Accession #:    [id number]      Weight:       189.0 lb Date of Birth:  1965/08/02      BSA:          1.822 m Patient Age:    56 years        BP:           147/77 mmHg Patient Gender: F               HR:           76 bpm. Exam Location:  [facility] Procedure: 2D Echo, Limited Echo, Limited Color Doppler and Cardiac Doppler Indications:     I50.31 Acute Diastolic CHF  History:         Patient has prior history of Echocardiogram examinations, most                  recent 10/01/2022. Risk Factors:Diabetes.  Sonographer:     Daphine Deutscher RDCS Referring Phys:  [id number] [facility] MICHELLE TANG Diagnosing Phys: Rozell Searing Custovic IMPRESSIONS  1. Left ventricular ejection fraction, by estimation, is 60 to 65%. Left ventricular ejection fraction by PLAX is 63 %. The left ventricle has normal function. The left ventricle has no regional wall motion abnormalities. Left ventricular diastolic parameters are consistent with Grade I diastolic dysfunction (impaired relaxation).  2.  Right ventricular systolic function is normal. The right ventricular size is normal.  3. The mitral valve is grossly normal. No evidence of mitral valve regurgitation.  4. The aortic valve is grossly normal. Aortic valve regurgitation is mild to moderate. Aortic regurgitation PHT measures 372 msec. FINDINGS  Left Ventricle: Left ventricular ejection fraction, by estimation, is 60 to 65%. Left ventricular ejection fraction by PLAX is 63 %. The left ventricle has normal function. The left ventricle has no regional wall motion abnormalities. The left ventricular internal cavity size was normal in size. There is no left ventricular hypertrophy. Left ventricular diastolic parameters are consistent with Grade I diastolic dysfunction (impaired relaxation). Right Ventricle: The right ventricular size is normal. No increase in right ventricular wall thickness. Right ventricular systolic function is normal. Left Atrium: Left atrial size was normal in size. Right Atrium: Right atrial size was normal in size. Pericardium: There is no evidence of pericardial effusion. Mitral Valve: The mitral valve is grossly normal. Tricuspid Valve: The tricuspid valve is grossly normal. Tricuspid valve regurgitation is trivial. Aortic Valve: The aortic valve is grossly normal. Aortic valve regurgitation is mild to moderate. Aortic regurgitation PHT measures 372 msec. Pulmonic Valve: The pulmonic valve was grossly normal. Pulmonic valve regurgitation is not visualized. Aorta: The aortic root is normal in size and structure. IAS/Shunts: No atrial level shunt detected by color flow Doppler. LEFT VENTRICLE PLAX 2D LV EF:         Left            Diastology                ventricular     LV e' medial:    7.18 cm/s                ejection        LV E/e' medial:  11.7                fraction by     LV e' lateral:   11.75 cm/s                PLAX is 63      LV E/e' lateral: 7.1                %.  LVIDd:         3.90 cm LVIDs:         2.60 cm LV PW:          0.60 cm LV IVS:        0.60 cm  RIGHT VENTRICLE RV S prime:     11.40 cm/s TAPSE (M-mode): 2.4 cm LEFT ATRIUM         Index LA diam:    3.30 cm 1.81 cm/m  AORTIC VALVE AI PHT:      372 msec  AORTA Ao Root diam: 3.10 cm MITRAL VALVE MV Area (PHT): 3.81 cm MV Decel Time: 199 msec MV E velocity: 83.85 cm/s MV A velocity: 100.50 cm/s MV E/A ratio:  0.83 Sabina Custovic Electronically signed by Clotilde Dieter Signature Date/Time: 01/12/2023/9:20:19 AM    Final    CT HEAD WO CONTRAST ( )  Result Date: 01/11/2023 CLINICAL DATA:  Altered mental status EXAM: CT HEAD WITHOUT CONTRAST TECHNIQUE: Contiguous axial images were obtained from the base of the skull through the vertex without intravenous contrast. RADIATION DOSE REDUCTION: This exam was performed according to the departmental dose-optimization program which includes automated exposure control, adjustment of the mA and/or kV according to patient size and/or use of iterative reconstruction technique. COMPARISON:  10/24/2022 FINDINGS: Brain: No acute intracranial abnormality. Specifically, no hemorrhage, hydrocephalus, mass lesion, acute infarction, or significant intracranial injury. Vascular: No hyperdense vessel or unexpected calcification. Skull: No acute calvarial abnormality. Sinuses/Orbits: No acute findings Other: None IMPRESSION: No acute intracranial abnormality. Electronically Signed   By: Charlett Nose M.D.   On: 01/11/2023 00:04   DG Chest 2 View  Result Date: 01/10/2023 CLINICAL DATA:  Shortness of breath EXAM: CHEST - 2 VIEW COMPARISON:  Chest x-ray 01/06/2023 FINDINGS: The heart size and mediastinal contours are within normal limits. Both lungs are clear. Chronic left humeral neck fracture again noted. IMPRESSION: No active cardiopulmonary disease. Electronically Signed   By: Darliss Cheney M.D.   On: 01/10/2023 19:58   CT Angio Chest PE W/Cm &/Or Wo Cm  Result Date: 01/06/2023 CLINICAL DATA:  Severe chest pain. New onset CHF. Evaluate for  pulmonary embolism. EXAM: CT ANGIOGRAPHY CHEST WITH CONTRAST TECHNIQUE: Multidetector CT imaging of the chest was performed using the standard protocol during bolus administration of intravenous contrast. Multiplanar CT image reconstructions and MIPs were obtained to evaluate the vascular anatomy. RADIATION DOSE REDUCTION: This exam was performed according to the departmental dose-optimization program which includes automated exposure control, adjustment of the mA and/or kV according to patient size and/or use of iterative reconstruction technique. CONTRAST:  75mL OMNIPAQUE IOHEXOL 350 MG/ML SOLN COMPARISON:  Chest radiograph-01/06/2023 FINDINGS: Vascular Findings: There is adequate opacification of the pulmonary arterial system with the main pulmonary artery measuring 433 Hounsfield units. There are no discrete filling defects within the pulmonary arterial tree to suggest pulmonary embolism. Normal caliber of the main pulmonary artery. Normal heart size. No pericardial effusion. Coronary artery calcifications. No evidence of thoracic aortic aneurysm or dissection on this nongated examination. Bovine configuration of the aortic arch. The branch vessels of the aortic arch appear widely patent throughout their imaged courses. Review of the MIP images confirms the above findings. ---------------------------------------------------------------------------------- Nonvascular Findings: Mediastinum/Lymph Nodes: Scattered mediastinal lymph nodes are not enlarged by size criteria, presumably reactive in etiology. No bulky mediastinal, hilar or axillary lymphadenopathy. Lungs/Pleura: No focal airspace opacities. No pleural effusion or pneumothorax. The central pulmonary airways appear widely patent. Punctate granuloma are seen within the lungs bilaterally (images  45, 63 and 68, series 6). No discrete worrisome pulmonary nodules. Upper abdomen: Limited early arterial phase evaluation of the upper abdomen demonstrates mild  nodularity of the hepatic contour as could be seen in the setting of early cirrhotic change. Postcholecystectomy. Small hiatal hernia. Debris is seen scattered throughout the esophagus without definitive evidence of esophageal wall thickening. Musculoskeletal: No acute or aggressive osseous abnormalities. Normal appearance of the thyroid. Regional soft tissues appear normal. IMPRESSION: 1. No acute cardiopulmonary disease. Specifically, no evidence of pulmonary embolism. 2. Coronary artery calcifications. Aortic Atherosclerosis (ICD10-I70.0). 3. Small hiatal hernia with debris scattered throughout the esophagus, nonspecific though could be seen in the setting of gastroesophageal reflux. 4. Mild nodularity of the hepatic contour as could be seen in the setting of early cirrhotic change. Correlation with LFTs is advised. Electronically Signed   By: Simonne Come M.D.   On: 01/06/2023 12:47   DG Chest 2 View  Result Date: 01/06/2023 CLINICAL DATA:  sob EXAM: CHEST - 2 VIEW COMPARISON:  CXR 01/06/23 FINDINGS: The heart size and mediastinal contours are within normal limits. Both lungs are clear. The visualized skeletal structures are unremarkable. Surgical clips in the right upper quadrant IMPRESSION: No focal airspace opacity Electronically Signed   By: Lorenza Cambridge M.D.   On: 01/06/2023 10:16   DG Chest 1 View  Result Date: 01/06/2023 CLINICAL DATA:  Chest pain EXAM: CHEST  1 VIEW COMPARISON:  09/30/2022 FINDINGS: The heart size and mediastinal contours are within normal limits. Both lungs are clear. The visualized skeletal structures are unremarkable. IMPRESSION: No active disease. Electronically Signed   By: Jasmine Pang M.D.   On: 01/06/2023 01:39      Subjective: Seen and examined on the day of discharge.  Stable no distress.  Appropriate for discharge home.  Discharge Exam: Vitals:   01/18/23 1122 01/18/23 1217  BP: (!) 156/59 (!) 142/77  Pulse: 81 85  Resp: 20 (!) 21  Temp: 98.3 F (36.8 C)  98.2 F (36.8 C)  SpO2: 97% 99%   Vitals:   01/18/23 0547 01/18/23 0737 01/18/23 1122 01/18/23 1217  BP: (!) 128/59 134/70 (!) 156/59 (!) 142/77  Pulse: 77 79 81 85  Resp: 18 16 20  (!) 21  Temp: 98.7 F (37.1 C) 98.4 F (36.9 C) 98.3 F (36.8 C) 98.2 F (36.8 C)  TempSrc: Axillary Axillary Oral   SpO2: 98% 98% 97% 99%  Weight:      Height:        General: Pt is alert, awake, not in acute distress Cardiovascular: RRR, S1/S2 +, no rubs, no gallops Respiratory: CTA bilaterally, no wheezing, no rhonchi Abdominal: Soft, NT, ND, bowel sounds + Extremities: no edema, no cyanosis    The results of significant diagnostics from this hospitalization (including imaging, microbiology, ancillary and laboratory) are listed below for reference.     Microbiology: Recent Results (from the past 240 hour(s))  SARS Coronavirus 2 by RT PCR (hospital order, performed in [facility] hospital lab) *cepheid single result test* Anterior Nasal Swab     Status: None   Collection Time: 01/16/23  8:27 PM   Specimen: Anterior Nasal Swab  Result Value Ref Range Status   SARS Coronavirus 2 by RT PCR NEGATIVE NEGATIVE Final    Comment: (NOTE) SARS-CoV-2 target nucleic acids are NOT DETECTED.  The SARS-CoV-2 RNA is generally detectable in upper and lower respiratory specimens during the acute phase of infection. The lowest concentration of SARS-CoV-2 viral copies this assay can detect is  250 copies / mL. A negative result does not preclude SARS-CoV-2 infection and should not be used as the sole basis for treatment or other patient management decisions.  A negative result may occur with improper specimen collection / handling, submission of specimen other than nasopharyngeal swab, presence of viral mutation(s) within the areas targeted by this assay, and inadequate number of viral copies (<250 copies / mL). A negative result must be combined with clinical observations, patient history, and  epidemiological information.  Fact Sheet for Patients:   RoadLapTop.co.za  Fact Sheet for Healthcare Providers: http://kim-miller.com/  This test is not yet approved or  cleared by the [place] FDA and has been authorized for detection and/or diagnosis of SARS-CoV-2 by FDA under an Emergency Use Authorization (EUA).  This EUA will remain in effect (meaning this test can be used) for the duration of the COVID-19 declaration under Section 564(b)(1) of the Act, 21 U.S.C. section 360bbb-3(b)(1), unless the authorization is terminated or revoked sooner.  Performed at [facility], [address] [place], [place] [address]      Labs: BNP (last 3 results) Recent Labs    01/06/23 1027 01/10/23 2355 01/16/23 2025  BNP 131.7* 198.7* 162.4*   Basic Metabolic Panel: Recent Labs  Lab 01/12/23 0811 01/13/23 0523 01/14/23 0340 01/15/23 0510 01/16/23 2025 01/17/23 0411  NA 133* 126* 126* 131* 129* 134*  K 4.3 4.3 4.6 4.3 4.3 3.9  CL  --  92* 93* 99 98 106  CO2  --  24 26 24  19* 23  GLUCOSE  --  130* 138* 187* 299* 148*  BUN  --  41* 47* 56* 46* 37*  CREATININE 1.52* 1.34* 1.43* 1.57* 1.37* 1.09*  CALCIUM  --  8.4* 8.0* 8.1* 8.0* 7.2*  MG 2.0 2.1  --   --  2.2  --    Liver Function Tests: Recent Labs  Lab 01/16/23 2025  AST 20  ALT 19  ALKPHOS 71  BILITOT 0.6  PROT 6.5  ALBUMIN 3.2*   No results for input(s): "LIPASE", "AMYLASE" in the last 168 hours. No results for input(s): "AMMONIA" in the last 168 hours. CBC: Recent Labs  Lab 01/12/23 0355 01/16/23 2025 01/17/23 0411  WBC 6.8 9.0 8.9  NEUTROABS 4.0 6.7  --   HGB 10.6* 9.7* 9.2*  HCT 31.3* 28.6* 27.6*  MCV 90.5 92.6 93.6  PLT 304 346 310   Cardiac Enzymes: No results for input(s): "CKTOTAL", "CKMB", "CKMBINDEX", "TROPONINI" in the last 168 hours. BNP: Invalid input(s): "POCBNP" CBG: Recent Labs  Lab 01/17/23 2023 01/18/23 0806  01/18/23 1004 01/18/23 1118 01/18/23 1217  GLUCAP 158* 126* 154* 140* 124*   D-Dimer No results for input(s): "DDIMER" in the last 72 hours. Hgb A1c No results for input(s): "HGBA1C" in the last 72 hours. Lipid Profile No results for input(s): "CHOL", "HDL", "LDLCALC", "TRIG", "CHOLHDL", "LDLDIRECT" in the last 72 hours. Thyroid function studies No results for input(s): "TSH", "T4TOTAL", "T3FREE", "THYROIDAB" in the last 72 hours.  Invalid input(s): "FREET3" Anemia work up No results for input(s): "VITAMINB12", "FOLATE", "FERRITIN", "TIBC", "IRON", "RETICCTPCT" in the last 72 hours. Urinalysis    Component Value Date/Time   COLORURINE YELLOW (A) 09/30/2022 0646   APPEARANCEUR CLOUDY (A) 09/30/2022 0646   LABSPEC 1.006 09/30/2022 0646   PHURINE 6.0 09/30/2022 0646   GLUCOSEU NEGATIVE 09/30/2022 0646   HGBUR SMALL (A) 09/30/2022 0646   BILIRUBINUR NEGATIVE 09/30/2022 0646   KETONESUR NEGATIVE 09/30/2022 0646   PROTEINUR 100 (A) 09/30/2022  3235   NITRITE POSITIVE (A) 09/30/2022 0646   LEUKOCYTESUR LARGE (A) 09/30/2022 0646   Sepsis Labs Recent Labs  Lab 01/12/23 0355 01/16/23 2025 01/17/23 0411  WBC 6.8 9.0 8.9   Microbiology Recent Results (from the past 240 hour(s))  SARS Coronavirus 2 by RT PCR (hospital order, performed in [facility] hospital lab) *cepheid single result test* Anterior Nasal Swab     Status: None   Collection Time: 01/16/23  8:27 PM   Specimen: Anterior Nasal Swab  Result Value Ref Range Status   SARS Coronavirus 2 by RT PCR NEGATIVE NEGATIVE Final    Comment: (NOTE) SARS-CoV-2 target nucleic acids are NOT DETECTED.  The SARS-CoV-2 RNA is generally detectable in upper and lower respiratory specimens during the acute phase of infection. The lowest concentration of SARS-CoV-2 viral copies this assay can detect is 250 copies / mL. A negative result does not preclude SARS-CoV-2 infection and should not be used as the sole basis for treatment or  other patient management decisions.  A negative result may occur with improper specimen collection / handling, submission of specimen other than nasopharyngeal swab, presence of viral mutation(s) within the areas targeted by this assay, and inadequate number of viral copies (<250 copies / mL). A negative result must be combined with clinical observations, patient history, and epidemiological information.  Fact Sheet for Patients:   RoadLapTop.co.za  Fact Sheet for Healthcare Providers: http://kim-miller.com/  This test is not yet approved or  cleared by the [place] FDA and has been authorized for detection and/or diagnosis of SARS-CoV-2 by FDA under an Emergency Use Authorization (EUA).  This EUA will remain in effect (meaning this test can be used) for the duration of the COVID-19 declaration under Section 564(b)(1) of the Act, 21 U.S.C. section 360bbb-3(b)(1), unless the authorization is terminated or revoked sooner.  Performed at [facility], [address], [place], [place] [address]      Time coordinating discharge: Over 30 minutes  SIGNED:   Tresa Moore, MD  [facility] 01/18/2023, 2:20 PM Pager   If 7PM-7AM, please contact night-coverage

## 2023-01-18 NOTE — Discharge Instructions (Signed)

## 2023-01-18 NOTE — Progress Notes (Deleted)
Physician Discharge Summary  Deanna Joseph TKZ:[id number] DOB: [birth year] DOA: 01/16/2023  PCP: Hillery Aldo, MD  Admit date: 01/16/2023 Discharge date: 01/18/2023  Admitted From: Home Disposition:  Home with home health  Recommendations for Outpatient Follow-up:  Follow up with PCP in 1-2 weeks Seek outpatient psychiatric services  Home Health: Yes PT Equipment/Devices: None  Discharge Condition: Stable CODE STATUS: Full Diet recommendation: Regular  Brief/Interim Summary:   57 year old female history significant for heart failure with preserved ejection fraction, stage IIIa chronic kidney disease, diabetes, GERD, who presents to the ED for evaluation of midsternal chest pain associated with diaphoresis.  Denied nausea or vomiting.   Troponins were minimally elevated on arrival triggering hospitalist consult for admission.  Placing patient under observation status.  Case discussed with cardiology.  No plans for ischemic evaluation during this admission.  Suspect that symptoms of chest pain were most likely related to rate and possibly mild acute decompensated diastolic heart failure.   Titrating beta-blocker dose to achieve heart rate control.  Received small dose of IV Lasix for lower extremity edema and possible acute on chronic HFpEF.  8/13.  Increase in beta-blocker dose appears to been effective.  Heart rate improved.  Troponins minimally elevated.  Not indicative of ACS.  No plans for ischemic evaluation during this admission.  Stable for discharge home at this time.  Increased dose of beta-blocker and Lasix prescribed on DC  Of note to the patient's husband at bedside at the time of my discharge discussion expressed great concern about patient seemingly insatiable appetite.  States she wants to eat constantly and drinks too much water.  He asks if there is any medication I can use to decrease this.  I explained to him at length that I believe that this is a psychiatric  diagnosis and the patient ideally needs cognitive behavioral therapy and a thorough evaluation by a qualified outpatient psychiatric provider.  I offered to place ambulatory referrals for outpatient psychology or psychiatry.  I explained that the community of [place] likely would not have the appropriate resources to effectively address and treat patient's apparent polydipsia and polyphagia.  Despite my explanation and recommendations the patient's husband is frustrated and states that he cannot deal with her like this at home.  I explained to him again that the hospital does not have the resources and outpatient follow-up is recommended.  He declined my offer for an ambulatory referral to outpatient psychiatric services.   Discharge Diagnoses:  Principal Problem:   Chest pain Active Problems:   Uncontrolled type 2 diabetes mellitus with hyperglycemia, without long-term current use of insulin (HCC)   GERD without esophagitis   Stage 3a chronic kidney disease (CKD) (HCC)  Atypical chest pain Symptomatic palpitations Troponin elevation Chest pain described as "heart pounding" EKG with sinus rhythm/sinus tachycardia.  Troponins minimally elevated.  No significant delta.  Inconsistent with ACS.  No plans for ischemic evaluation.  On discharge will increase metoprolol from 12.5 mg to 25 mg daily.  Also likely mild acute on chronic heart failure with preserved ejection fraction.  1-2+ lower extremity edema.  No pulmonary edema or pleural effusions.  Received 60 mg IV Lasix.  Will start 40 mg p.o. twice daily on discharge.  Polydipsia/polyphagia Lengthy discussion with patient and husband on the day of discharge.  I explained that this is likely underlying psychiatric diagnosis and needs to be thoroughly evaluated by a outpatient psychiatric provider before an official diagnosis is made or any treatment is offered.  Husband  was frustrated with this explanation.  I offered referral to outpatient  psychiatric services which she declined stating that he was unable to leave the [place] area.  It is likely that in order to find a specialist qualified to treat this patient that he would need to leave the [place].  He declined to do so for reasons that are unclear to me.  Of note the patient is established with Dr. Allena Katz at [facility].  Review of care everywhere does not show any recent visits.  The patient has however had multiple ER visits.  Discharge Instructions  Discharge Instructions     Diet - low sodium heart healthy   Complete by: As directed    Increase activity slowly   Complete by: As directed       Allergies as of 01/18/2023       Reactions   Sulfa Antibiotics Hives   Codeine Hives        Medication List     TAKE these medications    acetaminophen 500 MG tablet Commonly known as: TYLENOL Take 500 mg by mouth every 6 (six) hours as needed.   busPIRone 5 MG tablet Commonly known as: BUSPAR Take 1 tablet (5 mg total) by mouth 3 (three) times daily for 7 days, THEN 1.5 tablets (7.5 mg total) 3 (three) times daily for 14 days. Start taking on: January 18, 2023   feeding supplement Liqd Take 237 mLs by mouth 3 (three) times daily between meals.   furosemide 40 MG tablet Commonly known as: LASIX Take 1 tablet (40 mg total) by mouth 2 (two) times daily. What changed: when to take this   metoprolol succinate 25 MG 24 hr tablet Commonly known as: TOPROL-XL Take 1 tablet (25 mg total) by mouth daily. What changed: how much to take   omeprazole 40 MG capsule Commonly known as: PRILOSEC Take 40 mg by mouth daily as needed.   traMADol 50 MG tablet Commonly known as: ULTRAM Take 50 mg by mouth every 6 (six) hours as needed for moderate pain.   traZODone 50 MG tablet Commonly known as: DESYREL Take 50 mg by mouth at bedtime.        Follow-up Information     Hillery Aldo, MD. Go in 1 week(s).    Specialty: Family Medicine Why: Appointment on Monday, 01/24/2023 at 9:00am. Contact information: [address] [place] [address] [phone]                Allergies  Allergen Reactions   Sulfa Antibiotics Hives   Codeine Hives    Consultations: Cardiology   Procedures/Studies: DG Chest 2 View  Result Date: 01/16/2023 CLINICAL DATA:  Weakness and hunger EXAM: CHEST - 2 VIEW COMPARISON:  Chest radiograph dated 01/10/2023 FINDINGS: Normal lung volumes. No focal consolidations. No pleural effusion or pneumothorax. The heart size and mediastinal contours are within normal limits. No acute osseous abnormality. IMPRESSION: No active cardiopulmonary disease. Electronically Signed   By: Agustin Cree M.D.   On: 01/16/2023 20:54   ECHOCARDIOGRAM LIMITED  Result Date: 01/12/2023    ECHOCARDIOGRAM LIMITED REPORT   Patient Name:   AMRY FINGERHUT Date of Exam: 01/11/2023 Medical Rec #:  [id number]       Height:       60.0 in Accession #:    [id number]      Weight:       189.0 lb Date of Birth:  [birth year]      BSA:          1.822 m Patient Age:    56 years        BP:           147/77 mmHg Patient Gender: F               HR:           76 bpm. Exam Location:  [facility] Procedure: 2D Echo, Limited Echo, Limited Color Doppler and Cardiac Doppler Indications:     I50.31 Acute Diastolic CHF  History:         Patient has prior history of Echocardiogram examinations, most                  recent 10/01/2022. Risk Factors:Diabetes.  Sonographer:     Daphine Deutscher RDCS Referring Phys:  [id number] [facility] MICHELLE TANG Diagnosing Phys: Rozell Searing Custovic IMPRESSIONS  1. Left ventricular ejection fraction, by estimation, is 60 to 65%. Left ventricular ejection fraction by PLAX is 63 %. The left ventricle has normal function. The left ventricle has no regional wall motion abnormalities. Left ventricular diastolic parameters are consistent with Grade I diastolic dysfunction (impaired relaxation).  2.  Right ventricular systolic function is normal. The right ventricular size is normal.  3. The mitral valve is grossly normal. No evidence of mitral valve regurgitation.  4. The aortic valve is grossly normal. Aortic valve regurgitation is mild to moderate. Aortic regurgitation PHT measures 372 msec. FINDINGS  Left Ventricle: Left ventricular ejection fraction, by estimation, is 60 to 65%. Left ventricular ejection fraction by PLAX is 63 %. The left ventricle has normal function. The left ventricle has no regional wall motion abnormalities. The left ventricular internal cavity size was normal in size. There is no left ventricular hypertrophy. Left ventricular diastolic parameters are consistent with Grade I diastolic dysfunction (impaired relaxation). Right Ventricle: The right ventricular size is normal. No increase in right ventricular wall thickness. Right ventricular systolic function is normal. Left Atrium: Left atrial size was normal in size. Right Atrium: Right atrial size was normal in size. Pericardium: There is no evidence of pericardial effusion. Mitral Valve: The mitral valve is grossly normal. Tricuspid Valve: The tricuspid valve is grossly normal. Tricuspid valve regurgitation is trivial. Aortic Valve: The aortic valve is grossly normal. Aortic valve regurgitation is mild to moderate. Aortic regurgitation PHT measures 372 msec. Pulmonic Valve: The pulmonic valve was grossly normal. Pulmonic valve regurgitation is not visualized. Aorta: The aortic root is normal in size and structure. IAS/Shunts: No atrial level shunt detected by color flow Doppler. LEFT VENTRICLE PLAX 2D LV EF:         Left            Diastology                ventricular     LV e' medial:    7.18 cm/s                ejection        LV E/e' medial:  11.7                fraction by     LV e' lateral:   11.75 cm/s                PLAX is 63      LV E/e' lateral: 7.1                %.  LVIDd:         3.90 cm LVIDs:         2.60 cm LV PW:          0.60 cm LV IVS:        0.60 cm  RIGHT VENTRICLE RV S prime:     11.40 cm/s TAPSE (M-mode): 2.4 cm LEFT ATRIUM         Index LA diam:    3.30 cm 1.81 cm/m  AORTIC VALVE AI PHT:      372 msec  AORTA Ao Root diam: 3.10 cm MITRAL VALVE MV Area (PHT): 3.81 cm MV Decel Time: 199 msec MV E velocity: 83.85 cm/s MV A velocity: 100.50 cm/s MV E/A ratio:  0.83 Sabina Custovic Electronically signed by Clotilde Dieter Signature Date/Time: 01/12/2023/9:20:19 AM    Final    CT HEAD WO CONTRAST ( )  Result Date: 01/11/2023 CLINICAL DATA:  Altered mental status EXAM: CT HEAD WITHOUT CONTRAST TECHNIQUE: Contiguous axial images were obtained from the base of the skull through the vertex without intravenous contrast. RADIATION DOSE REDUCTION: This exam was performed according to the departmental dose-optimization program which includes automated exposure control, adjustment of the mA and/or kV according to patient size and/or use of iterative reconstruction technique. COMPARISON:  10/24/2022 FINDINGS: Brain: No acute intracranial abnormality. Specifically, no hemorrhage, hydrocephalus, mass lesion, acute infarction, or significant intracranial injury. Vascular: No hyperdense vessel or unexpected calcification. Skull: No acute calvarial abnormality. Sinuses/Orbits: No acute findings Other: None IMPRESSION: No acute intracranial abnormality. Electronically Signed   By: Charlett Nose M.D.   On: 01/11/2023 00:04   DG Chest 2 View  Result Date: 01/10/2023 CLINICAL DATA:  Shortness of breath EXAM: CHEST - 2 VIEW COMPARISON:  Chest x-ray 01/06/2023 FINDINGS: The heart size and mediastinal contours are within normal limits. Both lungs are clear. Chronic left humeral neck fracture again noted. IMPRESSION: No active cardiopulmonary disease. Electronically Signed   By: Darliss Cheney M.D.   On: 01/10/2023 19:58   CT Angio Chest PE W/Cm &/Or Wo Cm  Result Date: 01/06/2023 CLINICAL DATA:  Severe chest pain. New onset CHF. Evaluate for  pulmonary embolism. EXAM: CT ANGIOGRAPHY CHEST WITH CONTRAST TECHNIQUE: Multidetector CT imaging of the chest was performed using the standard protocol during bolus administration of intravenous contrast. Multiplanar CT image reconstructions and MIPs were obtained to evaluate the vascular anatomy. RADIATION DOSE REDUCTION: This exam was performed according to the departmental dose-optimization program which includes automated exposure control, adjustment of the mA and/or kV according to patient size and/or use of iterative reconstruction technique. CONTRAST:  75mL OMNIPAQUE IOHEXOL 350 MG/ML SOLN COMPARISON:  Chest radiograph-01/06/2023 FINDINGS: Vascular Findings: There is adequate opacification of the pulmonary arterial system with the main pulmonary artery measuring 433 Hounsfield units. There are no discrete filling defects within the pulmonary arterial tree to suggest pulmonary embolism. Normal caliber of the main pulmonary artery. Normal heart size. No pericardial effusion. Coronary artery calcifications. No evidence of thoracic aortic aneurysm or dissection on this nongated examination. Bovine configuration of the aortic arch. The branch vessels of the aortic arch appear widely patent throughout their imaged courses. Review of the MIP images confirms the above findings. ---------------------------------------------------------------------------------- Nonvascular Findings: Mediastinum/Lymph Nodes: Scattered mediastinal lymph nodes are not enlarged by size criteria, presumably reactive in etiology. No bulky mediastinal, hilar or axillary lymphadenopathy. Lungs/Pleura: No focal airspace opacities. No pleural effusion or pneumothorax. The central pulmonary airways appear widely patent. Punctate granuloma are seen within the lungs bilaterally (images  45, 63 and 68, series 6). No discrete worrisome pulmonary nodules. Upper abdomen: Limited early arterial phase evaluation of the upper abdomen demonstrates mild  nodularity of the hepatic contour as could be seen in the setting of early cirrhotic change. Postcholecystectomy. Small hiatal hernia. Debris is seen scattered throughout the esophagus without definitive evidence of esophageal wall thickening. Musculoskeletal: No acute or aggressive osseous abnormalities. Normal appearance of the thyroid. Regional soft tissues appear normal. IMPRESSION: 1. No acute cardiopulmonary disease. Specifically, no evidence of pulmonary embolism. 2. Coronary artery calcifications. Aortic Atherosclerosis (ICD10-I70.0). 3. Small hiatal hernia with debris scattered throughout the esophagus, nonspecific though could be seen in the setting of gastroesophageal reflux. 4. Mild nodularity of the hepatic contour as could be seen in the setting of early cirrhotic change. Correlation with LFTs is advised. Electronically Signed   By: Simonne Come M.D.   On: 01/06/2023 12:47   DG Chest 2 View  Result Date: 01/06/2023 CLINICAL DATA:  sob EXAM: CHEST - 2 VIEW COMPARISON:  CXR 01/06/23 FINDINGS: The heart size and mediastinal contours are within normal limits. Both lungs are clear. The visualized skeletal structures are unremarkable. Surgical clips in the right upper quadrant IMPRESSION: No focal airspace opacity Electronically Signed   By: Lorenza Cambridge M.D.   On: 01/06/2023 10:16   DG Chest 1 View  Result Date: 01/06/2023 CLINICAL DATA:  Chest pain EXAM: CHEST  1 VIEW COMPARISON:  09/30/2022 FINDINGS: The heart size and mediastinal contours are within normal limits. Both lungs are clear. The visualized skeletal structures are unremarkable. IMPRESSION: No active disease. Electronically Signed   By: Jasmine Pang M.D.   On: 01/06/2023 01:39      Subjective: Seen and examined on the day of discharge.  Stable no distress.  Appropriate for discharge home.  Discharge Exam: Vitals:   01/18/23 0737 01/18/23 1122  BP: 134/70 (!) 156/59  Pulse: 79 81  Resp: 16 20  Temp: 98.4 F (36.9 C) 98.3 F  (36.8 C)  SpO2: 98% 97%   Vitals:   01/18/23 0024 01/18/23 0547 01/18/23 0737 01/18/23 1122  BP: (!) 120/52 (!) 128/59 134/70 (!) 156/59  Pulse: 84 77 79 81  Resp: 19 18 16 20   Temp: 99 F (37.2 C) 98.7 F (37.1 C) 98.4 F (36.9 C) 98.3 F (36.8 C)  TempSrc: Oral Axillary Axillary Oral  SpO2: 97% 98% 98% 97%  Weight:      Height:        General: Pt is alert, awake, not in acute distress Cardiovascular: RRR, S1/S2 +, no rubs, no gallops Respiratory: CTA bilaterally, no wheezing, no rhonchi Abdominal: Soft, NT, ND, bowel sounds + Extremities: no edema, no cyanosis    The results of significant diagnostics from this hospitalization (including imaging, microbiology, ancillary and laboratory) are listed below for reference.     Microbiology: Recent Results (from the past 240 hour(s))  SARS Coronavirus 2 by RT PCR (hospital order, performed in [facility] hospital lab) *cepheid single result test* Anterior Nasal Swab     Status: None   Collection Time: 01/16/23  8:27 PM   Specimen: Anterior Nasal Swab  Result Value Ref Range Status   SARS Coronavirus 2 by RT PCR NEGATIVE NEGATIVE Final    Comment: (NOTE) SARS-CoV-2 target nucleic acids are NOT DETECTED.  The SARS-CoV-2 RNA is generally detectable in upper and lower respiratory specimens during the acute phase of infection. The lowest concentration of SARS-CoV-2 viral copies this assay can detect is 250 copies /  mL. A negative result does not preclude SARS-CoV-2 infection and should not be used as the sole basis for treatment or other patient management decisions.  A negative result may occur with improper specimen collection / handling, submission of specimen other than nasopharyngeal swab, presence of viral mutation(s) within the areas targeted by this assay, and inadequate number of viral copies (<250 copies / mL). A negative result must be combined with clinical observations, patient history, and epidemiological  information.  Fact Sheet for Patients:   RoadLapTop.co.za  Fact Sheet for Healthcare Providers: http://kim-miller.com/  This test is not yet approved or  cleared by the [place] FDA and has been authorized for detection and/or diagnosis of SARS-CoV-2 by FDA under an Emergency Use Authorization (EUA).  This EUA will remain in effect (meaning this test can be used) for the duration of the COVID-19 declaration under Section 564(b)(1) of the Act, 21 U.S.C. section 360bbb-3(b)(1), unless the authorization is terminated or revoked sooner.  Performed at [facility], [address] [place], [place] [address]      Labs: BNP (last 3 results) Recent Labs    01/06/23 1027 01/10/23 2355 01/16/23 2025  BNP 131.7* 198.7* 162.4*   Basic Metabolic Panel: Recent Labs  Lab 01/11/23 1211 01/11/23 1601 01/12/23 0811 01/13/23 0523 01/14/23 0340 01/15/23 0510 01/16/23 2025 01/17/23 0411  NA 129*   < > 133* 126* 126* 131* 129* 134*  K 4.2  --  4.3 4.3 4.6 4.3 4.3 3.9  CL  --   --   --  92* 93* 99 98 106  CO2  --   --   --  24 26 24  19* 23  GLUCOSE  --   --   --  130* 138* 187* 299* 148*  BUN  --   --   --  41* 47* 56* 46* 37*  CREATININE  --    < > 1.52* 1.34* 1.43* 1.57* 1.37* 1.09*  CALCIUM  --   --   --  8.4* 8.0* 8.1* 8.0* 7.2*  MG 2.0  --  2.0 2.1  --   --  2.2  --    < > = values in this interval not displayed.   Liver Function Tests: Recent Labs  Lab 01/16/23 2025  AST 20  ALT 19  ALKPHOS 71  BILITOT 0.6  PROT 6.5  ALBUMIN 3.2*   No results for input(s): "LIPASE", "AMYLASE" in the last 168 hours. No results for input(s): "AMMONIA" in the last 168 hours. CBC: Recent Labs  Lab 01/12/23 0355 01/16/23 2025 01/17/23 0411  WBC 6.8 9.0 8.9  NEUTROABS 4.0 6.7  --   HGB 10.6* 9.7* 9.2*  HCT 31.3* 28.6* 27.6*  MCV 90.5 92.6 93.6  PLT 304 346 310   Cardiac Enzymes: No results for input(s): "CKTOTAL",  "CKMB", "CKMBINDEX", "TROPONINI" in the last 168 hours. BNP: Invalid input(s): "POCBNP" CBG: Recent Labs  Lab 01/17/23 1809 01/17/23 2023 01/18/23 0806 01/18/23 1004 01/18/23 1118  GLUCAP 160* 158* 126* 154* 140*   D-Dimer No results for input(s): "DDIMER" in the last 72 hours. Hgb A1c No results for input(s): "HGBA1C" in the last 72 hours. Lipid Profile No results for input(s): "CHOL", "HDL", "LDLCALC", "TRIG", "CHOLHDL", "LDLDIRECT" in the last 72 hours. Thyroid function studies No results for input(s): "TSH", "T4TOTAL", "T3FREE", "THYROIDAB" in the last 72 hours.  Invalid input(s): "FREET3" Anemia work up No results for input(s): "VITAMINB12", "FOLATE", "FERRITIN", "TIBC", "IRON", "RETICCTPCT" in the last 72 hours. Urinalysis  Component Value Date/Time   COLORURINE YELLOW (A) 09/30/2022 0646   APPEARANCEUR CLOUDY (A) 09/30/2022 0646   LABSPEC 1.006 09/30/2022 0646   PHURINE 6.0 09/30/2022 0646   GLUCOSEU NEGATIVE 09/30/2022 0646   HGBUR SMALL (A) 09/30/2022 0646   BILIRUBINUR NEGATIVE 09/30/2022 0646   KETONESUR NEGATIVE 09/30/2022 0646   PROTEINUR 100 (A) 09/30/2022 0646   NITRITE POSITIVE (A) 09/30/2022 0646   LEUKOCYTESUR LARGE (A) 09/30/2022 0646   Sepsis Labs Recent Labs  Lab 01/12/23 0355 01/16/23 2025 01/17/23 0411  WBC 6.8 9.0 8.9   Microbiology Recent Results (from the past 240 hour(s))  SARS Coronavirus 2 by RT PCR (hospital order, performed in [facility] hospital lab) *cepheid single result test* Anterior Nasal Swab     Status: None   Collection Time: 01/16/23  8:27 PM   Specimen: Anterior Nasal Swab  Result Value Ref Range Status   SARS Coronavirus 2 by RT PCR NEGATIVE NEGATIVE Final    Comment: (NOTE) SARS-CoV-2 target nucleic acids are NOT DETECTED.  The SARS-CoV-2 RNA is generally detectable in upper and lower respiratory specimens during the acute phase of infection. The lowest concentration of SARS-CoV-2 viral copies this assay can  detect is 250 copies / mL. A negative result does not preclude SARS-CoV-2 infection and should not be used as the sole basis for treatment or other patient management decisions.  A negative result may occur with improper specimen collection / handling, submission of specimen other than nasopharyngeal swab, presence of viral mutation(s) within the areas targeted by this assay, and inadequate number of viral copies (<250 copies / mL). A negative result must be combined with clinical observations, patient history, and epidemiological information.  Fact Sheet for Patients:   RoadLapTop.co.za  Fact Sheet for Healthcare Providers: http://kim-miller.com/  This test is not yet approved or  cleared by the [place] FDA and has been authorized for detection and/or diagnosis of SARS-CoV-2 by FDA under an Emergency Use Authorization (EUA).  This EUA will remain in effect (meaning this test can be used) for the duration of the COVID-19 declaration under Section 564(b)(1) of the Act, 21 U.S.C. section 360bbb-3(b)(1), unless the authorization is terminated or revoked sooner.  Performed at [facility], [address] [place], [place] [address]      Time coordinating discharge: Over 30 minutes  SIGNED:   Tresa Moore, MD  [facility] 01/18/2023, 11:49 AM Pager   If 7PM-7AM, please contact night-coverage

## 2023-01-18 NOTE — Progress Notes (Signed)
Patient discharging home. All DC paperwork reviewed and sent with pt and pt spouse. Pt leaving with all personal belongings with spouse via private vehicle

## 2023-01-18 NOTE — TOC Transition Note (Signed)
Transition of Care [facility]) - CM/SW Discharge Note   Patient Details  Name: Deanna Joseph MRN: [id number] Date of Birth: Apr 05, 1966  Transition of Care [facility]) CM/SW Contact:  Darolyn Rua, LCSW Phone Number: 01/18/2023, 9:05 AM   Clinical Narrative:     Patient to discharge home today with home health services through [place], [place] with [facility] informed of discharge today.   Final next level of care: Home w Home Health Services Barriers to Discharge: No Barriers Identified   Patient Goals and CMS Choice CMS Medicare.gov Compare Post Acute Care list provided to:: Patient Choice offered to / list presented to : Patient  Discharge Placement                         Discharge Plan and Services Additional resources added to the After Visit Summary for                                       Social Determinants of Health (SDOH) Interventions SDOH Screenings   Food Insecurity: No Food Insecurity (01/17/2023)  Housing: Low Risk  (01/17/2023)  Transportation Needs: No Transportation Needs (01/17/2023)  Utilities: Not At Risk (01/17/2023)  Financial Resource Strain: Low Risk  (02/26/2020)   Received from [facility], [facility]  Recent Concern: Financial Resource Strain - Medium Risk (01/13/2020)   Received from [facility]  Tobacco Use: Low Risk  (01/17/2023)     Readmission Risk Interventions     No data to display

## 2023-01-19 ENCOUNTER — Encounter: Payer: Medicaid Other | Admitting: Family

## 2023-01-21 ENCOUNTER — Other Ambulatory Visit: Payer: Self-pay

## 2023-01-21 ENCOUNTER — Emergency Department: Payer: Medicaid Other

## 2023-01-21 ENCOUNTER — Emergency Department
Admission: EM | Admit: 2023-01-21 | Discharge: 2023-01-21 | Disposition: A | Discharge status: Home / Self Care | Payer: Medicaid Other | Attending: Emergency Medicine | Admitting: Emergency Medicine

## 2023-01-21 DIAGNOSIS — R0989 Other specified symptoms and signs involving the circulatory and respiratory systems: Secondary | ICD-10-CM | POA: Diagnosis not present

## 2023-01-21 DIAGNOSIS — N189 Chronic kidney disease, unspecified: Secondary | ICD-10-CM | POA: Diagnosis not present

## 2023-01-21 DIAGNOSIS — R002 Palpitations: Secondary | ICD-10-CM | POA: Insufficient documentation

## 2023-01-21 DIAGNOSIS — F419 Anxiety disorder, unspecified: Secondary | ICD-10-CM | POA: Diagnosis not present

## 2023-01-21 DIAGNOSIS — R918 Other nonspecific abnormal finding of lung field: Secondary | ICD-10-CM | POA: Diagnosis not present

## 2023-01-21 DIAGNOSIS — I509 Heart failure, unspecified: Secondary | ICD-10-CM | POA: Insufficient documentation

## 2023-01-21 DIAGNOSIS — E1122 Type 2 diabetes mellitus with diabetic chronic kidney disease: Secondary | ICD-10-CM | POA: Diagnosis not present

## 2023-01-21 DIAGNOSIS — R079 Chest pain, unspecified: Secondary | ICD-10-CM | POA: Diagnosis not present

## 2023-01-21 LAB — BASIC METABOLIC PANEL
Anion gap: 9 (ref 5–15)
BUN: 46 mg/dL — ABNORMAL HIGH (ref 6–20)
CO2: 26 mmol/L (ref 22–32)
Calcium: 8.6 mg/dL — ABNORMAL LOW (ref 8.9–10.3)
Chloride: 102 mmol/L (ref 98–111)
Creatinine, Ser: 1.38 mg/dL — ABNORMAL HIGH (ref 0.44–1.00)
GFR, Estimated: 45 mL/min — ABNORMAL LOW (ref >60)
Glucose, Bld: 216 mg/dL — ABNORMAL HIGH (ref 70–99)
Potassium: 4.2 mmol/L (ref 3.5–5.1)
Sodium: 137 mmol/L (ref 135–145)

## 2023-01-21 LAB — HEPATIC FUNCTION PANEL
ALT: 23 U/L (ref 0–44)
AST: 22 U/L (ref 15–41)
Albumin: 3.7 g/dL (ref 3.5–5.0)
Alkaline Phosphatase: 64 U/L (ref 38–126)
Bilirubin, Direct: <0.1 mg/dL (ref 0.0–0.2)
Total Bilirubin: <0.1 mg/dL — ABNORMAL LOW (ref 0.3–1.2)
Total Protein: 7.3 g/dL (ref 6.5–8.1)

## 2023-01-21 LAB — CBC
HCT: 29.9 % — ABNORMAL LOW (ref 36.0–46.0)
Hemoglobin: 9.7 g/dL — ABNORMAL LOW (ref 12.0–15.0)
MCH: 30.5 pg (ref 26.0–34.0)
MCHC: 32.4 g/dL (ref 30.0–36.0)
MCV: 94 fL (ref 80.0–100.0)
Platelets: 357 10*3/uL (ref 150–400)
RBC: 3.18 MIL/uL — ABNORMAL LOW (ref 3.87–5.11)
RDW: 13.7 % (ref 11.5–15.5)
WBC: 7.8 10*3/uL (ref 4.0–10.5)
nRBC: 0 % (ref 0.0–0.2)

## 2023-01-21 LAB — LIPASE, BLOOD: Lipase: 85 U/L — ABNORMAL HIGH (ref 11–51)

## 2023-01-21 LAB — TROPONIN I (HIGH SENSITIVITY): Troponin I (High Sensitivity): 8 ng/L (ref <18)

## 2023-01-21 NOTE — Discharge Instructions (Signed)
Please call the number provided for [facility] to arrange a follow-up appointment as soon as possible for further evaluation.  Return to the emergency department for any trouble breathing, chest pain or any other symptom concerning to yourself.

## 2023-01-21 NOTE — ED Provider Notes (Signed)
[facility] Provider Note    Event Date/Time   First MD Initiated Contact with Patient 01/21/23 1228     (approximate)  History   Chief Complaint: Palpitations  HPI  Deanna Joseph is a 57 y.o. female with a past medical history of CHF, CKD, diabetes, presents to the emergency department for evaluation.  According to the husband the patient was complaining of palpitations today.  Husband also states she persistently has begging him for something to eat says she feels hungry.  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  Husband states he did not know what else to do for her so he brought her to the emergency department for evaluation.  Here patient denies any pain.  Patient was discharged from the hospital 3 days ago at that time was placed back on furosemide 40 mg twice daily which they have been taking.  I also see a long note which the patient and husband spoke to the doctor regarding psychiatric evaluation at that time patient and husband refused it but he is open to psychiatric evaluation now.  Patient is calm and cooperative here.  Physical Exam   Triage Vital Signs: ED Triage Vitals  Encounter Vitals Group     BP 01/21/23 1001 (!) 150/80     Systolic BP Percentile --      Diastolic BP Percentile --      Pulse Rate 01/21/23 1001 93     Resp 01/21/23 1001 19     Temp 01/21/23 1001 98.6 F (37 C)     Temp src --      SpO2 01/21/23 1001 100 %     Weight --      Height --      Head Circumference --      Peak Flow --      Pain Score 01/21/23 1000 4     Pain Loc --      Pain Education --      Exclude from Growth Chart --     Most recent vital signs: Vitals:   01/21/23 1001  BP: (!) 150/80  Pulse: 93  Resp: 19  Temp: 98.6 F (37 C)  SpO2: 100%    General: Awake, no distress.  CV:  Good peripheral perfusion.  Regular rate and rhythm  Resp:  Normal  effort.  Equal breath sounds bilaterally.  Abd:  No distention.  Soft, nontender.  No rebound or guarding. Other:  1+ lower extremity IMA bilaterally.   ED Results / Procedures / Treatments   EKG  EKG viewed and interpreted by myself shows a normal sinus rhythm at 90 bpm with a narrow QRS, normal axis, normal intervals, nonspecific but no concerning ST changes.  No ST elevation.  RADIOLOGY  I have reviewed and interpreted chest x-ray images.  No consolidation or edema seen on my evaluation. Radiology states low lung volumes with bibasilar opacities likely atelectasis.   MEDICATIONS ORDERED IN ED: Medications - No data to display   IMPRESSION / MDM / ASSESSMENT AND PLAN / ED COURSE  I reviewed the triage vital signs and the nursing notes.  Patient's presentation is most consistent with acute presentation with potential threat to life or bodily function.  Patient presents emergency department for palpitations, as well as insatiable appetite per husband.  Husband states the patient woke him up around 530 this morning for something to eat he fixer something to  eat and then went back to bed.  Around 730 this morning she was again asking for something to eat to be fixed or something to eat.  He states several hours later the patient was again begging him for something to eat.  Husband states he cannot take her symptoms any longer so he brought her to the emergency department for evaluation.  The insatiable appetite and polydipsia/polyphagia seems to be an ongoing issue for the patient.  Could possibly be related to the patient's documented Warnicke Korsakoff syndrome.  I discussed with the husband patient would likely benefit from psychiatric evaluation as they may be able to provide some medications to help the patient relax.  I offered to trial an anxiety medication for the patient but they state they have tried those in the past and they have not been successful.  From a medical standpoint  patient is EKG is reassuring she has been on telemetry for nearly an hour in the emergency department with no ectopic beats or arrhythmias.  Patient's chemistry is reassuring, CBC is reassuring and troponin is negative.  I discussed with the husband follow-up with [facility] for psychiatry he is agreeable.  Remainder of the patient's workup is reassuring I believe the patient is safe for discharge home.  Patient and husband agree.  FINAL CLINICAL IMPRESSION(S) / ED DIAGNOSES   Palpitations   Note:  This document was prepared using Dragon voice recognition software and may include unintentional dictation errors.   Minna Antis, MD 01/21/23 1259

## 2023-01-21 NOTE — ED Triage Notes (Signed)
Pt comes via EMS from [facility] with c/o CHF, and cp. Pt states cp started today. Pt states she wasn't feeling well yesterday.   Pt does have dementia. Husband on way.  Pt was just here for same and stayed in hospital for week and dc yesterday.

## 2023-01-24 DIAGNOSIS — I509 Heart failure, unspecified: Secondary | ICD-10-CM | POA: Diagnosis not present

## 2023-01-24 DIAGNOSIS — F418 Other specified anxiety disorders: Secondary | ICD-10-CM | POA: Diagnosis not present

## 2023-01-24 DIAGNOSIS — Z712 Person consulting for explanation of examination or test findings: Secondary | ICD-10-CM | POA: Diagnosis not present

## 2023-01-24 DIAGNOSIS — F04 Amnestic disorder due to known physiological condition: Secondary | ICD-10-CM | POA: Diagnosis not present

## 2023-01-24 DIAGNOSIS — R269 Unspecified abnormalities of gait and mobility: Secondary | ICD-10-CM | POA: Diagnosis not present

## 2023-01-25 NOTE — Progress Notes (Unsigned)
Advanced Heart Failure Clinic Note   Referring Physician: PCP: Hillery Aldo, MD Cardiologist: None   HPI:  Deanna Joseph is a 57 y/o female with a history of nonalcoholic Warnicke Korsakoff syndrome   Was in the ED 01/06/23 due to SOB, bil leg swelling and severe chest pain. Chest CTA negative. CXR negative. IV diuresed. Admitted 01/10/23 due to dyspnea and lower extremity edema and increased confusion from her baseline described as increased repetitiveness, asking the same question over and over again. Has been drinking a lot of water. Hyponatremic of 122. Improved to 131. IV diuresed. Nephrology consulted. Admitted 01/16/23 due to midsternal chest pain associated with diaphoresis. Troponins were minimally elevated on arrival. Titrating beta-blocker dose to achieve heart rate control. IV diuresed. Polydipsia and polyphagia thought to be due to psychiatric condition. Was in the ED 01/21/23 due to palpitations. EKG and labs normal. F/U with [facility] regarding anxiety.   Echo 10/01/22: 60-65% along with Grade I DD and normal PA pressure Echo     Review of Systems: [y] = yes, [ ]  = no   General: Weight gain [ ] ; Weight loss [ ] ; Anorexia [ ] ; Fatigue [ ] ; Fever [ ] ; Chills [ ] ; Weakness [ ]   Cardiac: Chest pain/pressure [ ] ; Resting SOB [ ] ; Exertional SOB [ ] ; Orthopnea [ ] ; Pedal Edema [ ] ; Palpitations [ ] ; Syncope [ ] ; Presyncope [ ] ; Paroxysmal nocturnal dyspnea[ ]   Pulmonary: Cough [ ] ; Wheezing[ ] ; Hemoptysis[ ] ; Sputum [ ] ; Snoring [ ]   GI: Vomiting[ ] ; Dysphagia[ ] ; Melena[ ] ; Hematochezia [ ] ; Heartburn[ ] ; Abdominal pain [ ] ; Constipation [ ] ; Diarrhea [ ] ; BRBPR [ ]   GU: Hematuria[ ] ; Dysuria [ ] ; Nocturia[ ]   Vascular: Pain in legs with walking [ ] ; Pain in feet with lying flat [ ] ; Non-healing sores [ ] ; Stroke [ ] ; TIA [ ] ; Slurred speech [ ] ;  Neuro: Headaches[ ] ; Vertigo[ ] ; Seizures[ ] ; Paresthesias[ ] ;Blurred vision [ ] ; Diplopia [ ] ; Vision changes [ ]   Ortho/Skin: Arthritis [ ] ;  Joint pain [ ] ; Muscle pain [ ] ; Joint swelling [ ] ; Back Pain [ ] ; Rash [ ]   Psych: Depression[ ] ; Anxiety[ ]   Heme: Bleeding problems [ ] ; Clotting disorders [ ] ; Anemia [ ]   Endocrine: Diabetes [ ] ; Thyroid dysfunction[ ]    Past Medical History:  Diagnosis Date   (HFpEF) heart failure with preserved ejection fraction (HCC)    Anemia due to chronic kidney disease    Chronic gastritis    CKD (chronic kidney disease), stage III (HCC)    Cognitive decline    DM2 (diabetes mellitus, type 2) (HCC)    Gastritis, Helicobacter pylori 2015   GERD (gastroesophageal reflux disease)    Wernicke-Korsakoff syndrome (HCC)     Current Outpatient Medications  Medication Sig Dispense Refill   acetaminophen (TYLENOL) 500 MG tablet Take 500 mg by mouth every 6 (six) hours as needed.     busPIRone (BUSPAR) 5 MG tablet Take 1 tablet (5 mg total) by mouth 3 (three) times daily for 7 days, THEN 1.5 tablets (7.5 mg total) 3 (three) times daily for 14 days. 84 tablet 0   feeding supplement, ENSURE ENLIVE, (ENSURE ENLIVE) LIQD Take 237 mLs by mouth 3 (three) times daily between meals.     furosemide (LASIX) 40 MG tablet Take 1 tablet (40 mg total) by mouth 2 (two) times daily. 60 tablet 1   metoprolol succinate (TOPROL-XL) 25 MG 24 hr tablet Take 1 tablet (25  mg total) by mouth daily. 30 tablet 1   omeprazole (PRILOSEC) 40 MG capsule Take 40 mg by mouth daily as needed.     traMADol (ULTRAM) 50 MG tablet Take 50 mg by mouth every 6 (six) hours as needed for moderate pain.     traZODone (DESYREL) 50 MG tablet Take 50 mg by mouth at bedtime.     No current facility-administered medications for this visit.    Allergies  Allergen Reactions   Sulfa Antibiotics Hives   Codeine Hives      Social History   Socioeconomic History   Marital status: Married    Spouse name: Not on file   Number of children: Not on file   Years of education: Not on file   Highest education level: Not on file  Occupational  History   Not on file  Tobacco Use   Smoking status: Never   Smokeless tobacco: Never  Substance and Sexual Activity   Alcohol use: Not Currently   Drug use: Not on file   Sexual activity: Not on file  Other Topics Concern   Not on file  Social History Narrative   Not on file   Social Determinants of Health   Financial Resource Strain: Low Risk  (02/26/2020)   Received from [facility]   Overall Financial Resource Strain (CARDIA)    Difficulty of Paying Living Expenses: Not very hard  Recent Concern: Financial Resource Strain - Medium Risk (01/13/2020)   Received from [facility]   Overall Financial Resource Strain (CARDIA)    Difficulty of Paying Living Expenses: Somewhat hard  Food Insecurity: No Food Insecurity (01/17/2023)   Hunger Vital Sign    Worried About Running Out of Food in the Last Year: Never true    Ran Out of Food in the Last Year: Never true  Transportation Needs: No Transportation Needs (01/17/2023)   PRAPARE - Administrator, Civil Service (Medical): No    Lack of Transportation (Non-Medical): No  Physical Activity: Not on file  Stress: Not on file  Social Connections: Not on file  Intimate Partner Violence: Not At Risk (01/17/2023)   Humiliation, Afraid, Rape, and Kick questionnaire    Fear of Current or Ex-Partner: No    Emotionally Abused: No    Physically Abused: No    Sexually Abused: No      Family History  Problem Relation Age of Onset   Breast cancer Maternal Aunt 55    There were no vitals filed for this visit.   PHYSICAL EXAM: General:  Well appearing. No respiratory difficulty HEENT: normal Neck: supple. no JVD. Carotids 2+ bilat; no bruits. No lymphadenopathy or thyromegaly appreciated. Cor: PMI nondisplaced. Regular rate & rhythm. No rubs, gallops or murmurs. Lungs: clear Abdomen: soft, nontender, nondistended. No hepatosplenomegaly. No bruits or masses. Good bowel sounds. Extremities: no  cyanosis, clubbing, rash, edema Neuro: alert & oriented x 3, cranial nerves grossly intact. moves all 4 extremities w/o difficulty. Affect pleasant.  ECG:   ASSESSMENT & PLAN:     Delma Freeze, FNP 01/25/23

## 2023-01-26 ENCOUNTER — Encounter: Payer: Self-pay | Admitting: Family

## 2023-01-26 ENCOUNTER — Ambulatory Visit: Payer: Medicaid Other | Attending: Family | Admitting: Family

## 2023-01-26 VITALS — BP 153/73 | HR 88 | Ht 60.0 in | Wt 167.0 lb

## 2023-01-26 DIAGNOSIS — E1122 Type 2 diabetes mellitus with diabetic chronic kidney disease: Secondary | ICD-10-CM | POA: Diagnosis not present

## 2023-01-26 DIAGNOSIS — F1096 Alcohol use, unspecified with alcohol-induced persisting amnestic disorder: Secondary | ICD-10-CM | POA: Insufficient documentation

## 2023-01-26 DIAGNOSIS — I5032 Chronic diastolic (congestive) heart failure: Secondary | ICD-10-CM

## 2023-01-26 DIAGNOSIS — Z79899 Other long term (current) drug therapy: Secondary | ICD-10-CM | POA: Insufficient documentation

## 2023-01-26 DIAGNOSIS — N189 Chronic kidney disease, unspecified: Secondary | ICD-10-CM | POA: Insufficient documentation

## 2023-01-26 DIAGNOSIS — E119 Type 2 diabetes mellitus without complications: Secondary | ICD-10-CM

## 2023-01-26 DIAGNOSIS — K219 Gastro-esophageal reflux disease without esophagitis: Secondary | ICD-10-CM | POA: Insufficient documentation

## 2023-01-26 DIAGNOSIS — F419 Anxiety disorder, unspecified: Secondary | ICD-10-CM

## 2023-01-26 DIAGNOSIS — N1831 Chronic kidney disease, stage 3a: Secondary | ICD-10-CM | POA: Diagnosis not present

## 2023-01-26 DIAGNOSIS — F04 Amnestic disorder due to known physiological condition: Secondary | ICD-10-CM

## 2023-01-26 MED ORDER — SPIRONOLACTONE 25 MG PO TABS: 25.0000 mg | ORAL_TABLET | Freq: Every day | ORAL | 3 refills | Status: DC

## 2023-01-26 NOTE — Patient Instructions (Addendum)
START Spironolactone 25mg  daily  Routine lab work today. Will notify you of abnormal results  Please wear your compression hose.  Follow up in 3-4 weeks  Do the following things EVERYDAY: Weigh yourself in the morning before breakfast. Write it down and keep it in a log. Take your medicines as prescribed Eat low salt foods--Limit salt (sodium) to 2000 mg per day.  Stay as active as you can everyday Limit all fluids for the day to less than 2 liters

## 2023-02-02 DIAGNOSIS — E119 Type 2 diabetes mellitus without complications: Secondary | ICD-10-CM | POA: Diagnosis not present

## 2023-02-23 NOTE — Progress Notes (Deleted)
Advanced Heart Failure Clinic Note   PCP: Hillery Aldo, MD (saw 08/24) Cardiologist: Abigail Miyamoto PA (last saw during recent admission)  HPI:  Deanna Joseph is a 57 y/o female with a history of nonalcoholic Wernicke Korsakoff syndrome, CKD, DM, GERD, anxiety, H pylori and chronic heart failure.    Was in the ED 01/06/23 due to SOB, bilateral leg swelling and severe chest pain. Chest CTA negative. CXR negative. IV diuresed. Admitted 01/10/23 due to dyspnea and lower extremity edema and increased confusion from her baseline described as increased repetitiveness, asking the same question over and over again. Has been drinking a lot of water. Hyponatremic of 122. Improved to 131. IV diuresed. Nephrology consulted. Admitted 01/16/23 due to midsternal chest pain associated with diaphoresis. Troponins were minimally elevated on arrival. Titrating beta-blocker dose to achieve heart rate control. IV diuresed. Polydipsia and polyphagia thought to be due to psychiatric condition. Was in the ED 01/21/23 due to palpitations. EKG and labs normal. F/U with [facility] regarding anxiety.   Echo 10/01/22: 60-65% along with Grade I DD and normal PA pressure Echo 01/11/23: EF 60-65% along with Grade I DD and mild/moderate AR  She presents today for a follow-up HF visit with a chief complaint of    At last visit, spironolactone 25mg  daily was started but she didn't get her BMP drawn 1 week after starting it.    ROS: All systems negative except as listed in HPI, PMH and Problem List.  SH:  Social History   Socioeconomic History   Marital status: Married    Spouse name: Not on file   Number of children: Not on file   Years of education: Not on file   Highest education level: Not on file  Occupational History   Not on file  Tobacco Use   Smoking status: Never   Smokeless tobacco: Never  Substance and Sexual Activity   Alcohol use: Not Currently   Drug use: Not on file   Sexual activity: Not on file  Other Topics  Concern   Not on file  Social History Narrative   Not on file   Social Determinants of Health   Financial Resource Strain: Low Risk  (02/26/2020)   Received from [facility]   Overall Financial Resource Strain (CARDIA)    Difficulty of Paying Living Expenses: Not very hard  Recent Concern: Financial Resource Strain - Medium Risk (01/13/2020)   Received from [facility]   Overall Financial Resource Strain (CARDIA)    Difficulty of Paying Living Expenses: Somewhat hard  Food Insecurity: No Food Insecurity (01/17/2023)   Hunger Vital Sign    Worried About Running Out of Food in the Last Year: Never true    Ran Out of Food in the Last Year: Never true  Transportation Needs: No Transportation Needs (01/17/2023)   PRAPARE - Administrator, Civil Service (Medical): No    Lack of Transportation (Non-Medical): No  Physical Activity: Not on file  Stress: Not on file  Social Connections: Not on file  Intimate Partner Violence: Not At Risk (01/17/2023)   Humiliation, Afraid, Rape, and Kick questionnaire    Fear of Current or Ex-Partner: No    Emotionally Abused: No    Physically Abused: No    Sexually Abused: No    FH:  Family History  Problem Relation Age of Onset   Breast cancer Maternal Aunt 30    Past Medical History:  Diagnosis Date   (  HFpEF) heart failure with preserved ejection fraction (HCC)    Anemia due to chronic kidney disease    Chronic gastritis    CKD (chronic kidney disease), stage III (HCC)    Cognitive decline    DM2 (diabetes mellitus, type 2) (HCC)    Gastritis, Helicobacter pylori 2015   GERD (gastroesophageal reflux disease)    Wernicke-Korsakoff syndrome (HCC)     Current Outpatient Medications  Medication Sig Dispense Refill   acetaminophen (TYLENOL) 500 MG tablet Take 500 mg by mouth every 6 (six) hours as needed.     feeding supplement, ENSURE ENLIVE, (ENSURE ENLIVE) LIQD Take 237 mLs by mouth 3 (three) times daily  between meals.     furosemide (LASIX) 40 MG tablet Take 1 tablet (40 mg total) by mouth 2 (two) times daily. 60 tablet 1   metoprolol succinate (TOPROL-XL) 25 MG 24 hr tablet Take 1 tablet (25 mg total) by mouth daily. 30 tablet 1   omeprazole (PRILOSEC) 40 MG capsule Take 40 mg by mouth daily as needed.     spironolactone (ALDACTONE) 25 MG tablet Take 1 tablet (25 mg total) by mouth daily. 90 tablet 3   traMADol (ULTRAM) 50 MG tablet Take 50 mg by mouth every 6 (six) hours as needed for moderate pain.     traZODone (DESYREL) 50 MG tablet Take 100 mg by mouth at bedtime.     No current facility-administered medications for this visit.      PHYSICAL EXAM:  General:  Well appearing. No resp difficulty HEENT: normal Neck: supple. JVP flat. Carotids 2+ bilaterally; no bruits. No lymphadenopathy or thryomegaly appreciated. Cor: PMI normal. Regular rate & rhythm. No rubs, gallops or murmurs. Lungs: clear Abdomen: soft, nontender, nondistended. No hepatosplenomegaly. No bruits or masses. Good bowel sounds. Extremities: no cyanosis, clubbing, rash, edema Neuro: alert & orientedx3, cranial nerves grossly intact. Moves all 4 extremities w/o difficulty. Affect pleasant.   ECG:   ASSESSMENT & PLAN: 1: Chronic heart failure with preserved ejection fraction- - suspect due to DM and possibly wernicke-korsakoff syndrome - NYHA class III - ReDs 34% - euvolemic - weighing daily; call for overnight weight gain > 2 pounds or weekly weight gain of > 5 pounds - weight 167 pounds from last visit here 1 month ago - Echo 10/01/22: 60-65% along with Grade I DD and normal PA pressure - Echo 01/11/23: EF 60-65% along with Grade I DD and mild/moderate AR - not adding salt to her food - trying to keep daily fluid intake to 4 water bottles  - saw cardiology (Tang) during 08/24 admission; needs f/u - continue furosemide 40mg  BID - continue metoprolol succinate 25mg  daily - continue spironolactone 25mg   daily - BMP today - discussed adding SGLT2 at next visit; husband thinks patient took jardiance in the past and "didn't feel good"; would be willing to try farxiga - get compression socks and put them on every morning with removal at bedtime - elevate legs when sitting for long periods of time - BNP 01/16/23 was 162.4  2: CKD- - BMP 01/21/23 showed sodium 137, potassium 4.2, creatinine 1.38 & GFR 45 - BMP today  3: DM- - A1c 01/15/23 was 6.7% - saw PCP Leonette Most [facility])   4: Wernicke-Korsakoff syndrome- - 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

## 2023-02-24 ENCOUNTER — Telehealth: Payer: Self-pay | Admitting: Family

## 2023-02-24 ENCOUNTER — Encounter: Payer: Medicaid Other | Admitting: Family

## 2023-02-24 DIAGNOSIS — E1165 Type 2 diabetes mellitus with hyperglycemia: Secondary | ICD-10-CM | POA: Diagnosis not present

## 2023-02-24 DIAGNOSIS — K2950 Unspecified chronic gastritis without bleeding: Secondary | ICD-10-CM | POA: Diagnosis not present

## 2023-02-24 DIAGNOSIS — N179 Acute kidney failure, unspecified: Secondary | ICD-10-CM | POA: Diagnosis not present

## 2023-02-24 DIAGNOSIS — Z87440 Personal history of urinary (tract) infections: Secondary | ICD-10-CM | POA: Diagnosis not present

## 2023-02-24 DIAGNOSIS — E785 Hyperlipidemia, unspecified: Secondary | ICD-10-CM | POA: Diagnosis not present

## 2023-02-24 DIAGNOSIS — K219 Gastro-esophageal reflux disease without esophagitis: Secondary | ICD-10-CM | POA: Diagnosis not present

## 2023-02-24 DIAGNOSIS — N1831 Chronic kidney disease, stage 3a: Secondary | ICD-10-CM | POA: Diagnosis not present

## 2023-02-24 DIAGNOSIS — E1169 Type 2 diabetes mellitus with other specified complication: Secondary | ICD-10-CM | POA: Diagnosis not present

## 2023-02-24 DIAGNOSIS — I130 Hypertensive heart and chronic kidney disease with heart failure and stage 1 through stage 4 chronic kidney disease, or unspecified chronic kidney disease: Secondary | ICD-10-CM | POA: Diagnosis not present

## 2023-02-24 DIAGNOSIS — D631 Anemia in chronic kidney disease: Secondary | ICD-10-CM | POA: Diagnosis not present

## 2023-02-24 DIAGNOSIS — F04 Amnestic disorder due to known physiological condition: Secondary | ICD-10-CM | POA: Diagnosis not present

## 2023-02-24 DIAGNOSIS — I5033 Acute on chronic diastolic (congestive) heart failure: Secondary | ICD-10-CM | POA: Diagnosis not present

## 2023-02-24 DIAGNOSIS — E1122 Type 2 diabetes mellitus with diabetic chronic kidney disease: Secondary | ICD-10-CM | POA: Diagnosis not present

## 2023-02-24 DIAGNOSIS — E871 Hypo-osmolality and hyponatremia: Secondary | ICD-10-CM | POA: Diagnosis not present

## 2023-02-24 NOTE — Telephone Encounter (Signed)
Patient did not show for her Heart Failure Clinic appointment on 02/24/23

## 2023-03-04 DIAGNOSIS — K219 Gastro-esophageal reflux disease without esophagitis: Secondary | ICD-10-CM | POA: Diagnosis not present

## 2023-03-04 DIAGNOSIS — I5033 Acute on chronic diastolic (congestive) heart failure: Secondary | ICD-10-CM | POA: Diagnosis not present

## 2023-03-04 DIAGNOSIS — E1122 Type 2 diabetes mellitus with diabetic chronic kidney disease: Secondary | ICD-10-CM | POA: Diagnosis not present

## 2023-03-04 DIAGNOSIS — E1165 Type 2 diabetes mellitus with hyperglycemia: Secondary | ICD-10-CM | POA: Diagnosis not present

## 2023-03-04 DIAGNOSIS — E1169 Type 2 diabetes mellitus with other specified complication: Secondary | ICD-10-CM | POA: Diagnosis not present

## 2023-03-04 DIAGNOSIS — F04 Amnestic disorder due to known physiological condition: Secondary | ICD-10-CM | POA: Diagnosis not present

## 2023-03-04 DIAGNOSIS — D631 Anemia in chronic kidney disease: Secondary | ICD-10-CM | POA: Diagnosis not present

## 2023-03-04 DIAGNOSIS — Z87440 Personal history of urinary (tract) infections: Secondary | ICD-10-CM | POA: Diagnosis not present

## 2023-03-04 DIAGNOSIS — K2950 Unspecified chronic gastritis without bleeding: Secondary | ICD-10-CM | POA: Diagnosis not present

## 2023-03-04 DIAGNOSIS — N179 Acute kidney failure, unspecified: Secondary | ICD-10-CM | POA: Diagnosis not present

## 2023-03-04 DIAGNOSIS — E785 Hyperlipidemia, unspecified: Secondary | ICD-10-CM | POA: Diagnosis not present

## 2023-03-04 DIAGNOSIS — E871 Hypo-osmolality and hyponatremia: Secondary | ICD-10-CM | POA: Diagnosis not present

## 2023-03-04 DIAGNOSIS — N1831 Chronic kidney disease, stage 3a: Secondary | ICD-10-CM | POA: Diagnosis not present

## 2023-03-04 DIAGNOSIS — I130 Hypertensive heart and chronic kidney disease with heart failure and stage 1 through stage 4 chronic kidney disease, or unspecified chronic kidney disease: Secondary | ICD-10-CM | POA: Diagnosis not present

## 2023-03-07 ENCOUNTER — Ambulatory Visit: Payer: Medicaid Other | Admitting: Dietician

## 2023-03-31 DIAGNOSIS — D649 Anemia, unspecified: Secondary | ICD-10-CM | POA: Diagnosis not present

## 2023-03-31 DIAGNOSIS — Z712 Person consulting for explanation of examination or test findings: Secondary | ICD-10-CM | POA: Diagnosis not present

## 2023-03-31 DIAGNOSIS — M129 Arthropathy, unspecified: Secondary | ICD-10-CM | POA: Diagnosis not present

## 2023-03-31 DIAGNOSIS — F418 Other specified anxiety disorders: Secondary | ICD-10-CM | POA: Diagnosis not present

## 2023-03-31 DIAGNOSIS — I509 Heart failure, unspecified: Secondary | ICD-10-CM | POA: Diagnosis not present

## 2023-03-31 DIAGNOSIS — E119 Type 2 diabetes mellitus without complications: Secondary | ICD-10-CM | POA: Diagnosis not present

## 2023-03-31 DIAGNOSIS — R269 Unspecified abnormalities of gait and mobility: Secondary | ICD-10-CM | POA: Diagnosis not present

## 2023-03-31 DIAGNOSIS — Z1389 Encounter for screening for other disorder: Secondary | ICD-10-CM | POA: Diagnosis not present

## 2023-04-01 DIAGNOSIS — I130 Hypertensive heart and chronic kidney disease with heart failure and stage 1 through stage 4 chronic kidney disease, or unspecified chronic kidney disease: Secondary | ICD-10-CM | POA: Diagnosis not present

## 2023-04-01 DIAGNOSIS — K219 Gastro-esophageal reflux disease without esophagitis: Secondary | ICD-10-CM | POA: Diagnosis not present

## 2023-04-01 DIAGNOSIS — Z87440 Personal history of urinary (tract) infections: Secondary | ICD-10-CM | POA: Diagnosis not present

## 2023-04-01 DIAGNOSIS — F04 Amnestic disorder due to known physiological condition: Secondary | ICD-10-CM | POA: Diagnosis not present

## 2023-04-01 DIAGNOSIS — D631 Anemia in chronic kidney disease: Secondary | ICD-10-CM | POA: Diagnosis not present

## 2023-04-01 DIAGNOSIS — E1169 Type 2 diabetes mellitus with other specified complication: Secondary | ICD-10-CM | POA: Diagnosis not present

## 2023-04-01 DIAGNOSIS — K2950 Unspecified chronic gastritis without bleeding: Secondary | ICD-10-CM | POA: Diagnosis not present

## 2023-04-01 DIAGNOSIS — I5033 Acute on chronic diastolic (congestive) heart failure: Secondary | ICD-10-CM | POA: Diagnosis not present

## 2023-04-01 DIAGNOSIS — N179 Acute kidney failure, unspecified: Secondary | ICD-10-CM | POA: Diagnosis not present

## 2023-04-01 DIAGNOSIS — E785 Hyperlipidemia, unspecified: Secondary | ICD-10-CM | POA: Diagnosis not present

## 2023-04-01 DIAGNOSIS — E1122 Type 2 diabetes mellitus with diabetic chronic kidney disease: Secondary | ICD-10-CM | POA: Diagnosis not present

## 2023-04-01 DIAGNOSIS — E1165 Type 2 diabetes mellitus with hyperglycemia: Secondary | ICD-10-CM | POA: Diagnosis not present

## 2023-04-01 DIAGNOSIS — E871 Hypo-osmolality and hyponatremia: Secondary | ICD-10-CM | POA: Diagnosis not present

## 2023-04-01 DIAGNOSIS — N1831 Chronic kidney disease, stage 3a: Secondary | ICD-10-CM | POA: Diagnosis not present

## 2023-05-15 ENCOUNTER — Emergency Department: Payer: Medicaid Other

## 2023-05-15 ENCOUNTER — Other Ambulatory Visit: Payer: Self-pay

## 2023-05-15 ENCOUNTER — Emergency Department
Admission: EM | Admit: 2023-05-15 | Discharge: 2023-05-15 | Disposition: A | Discharge status: Home / Self Care | Payer: Medicaid Other | Attending: Emergency Medicine | Admitting: Emergency Medicine

## 2023-05-15 DIAGNOSIS — R1031 Right lower quadrant pain: Secondary | ICD-10-CM | POA: Diagnosis not present

## 2023-05-15 DIAGNOSIS — I509 Heart failure, unspecified: Secondary | ICD-10-CM | POA: Insufficient documentation

## 2023-05-15 DIAGNOSIS — R079 Chest pain, unspecified: Secondary | ICD-10-CM | POA: Diagnosis not present

## 2023-05-15 DIAGNOSIS — E119 Type 2 diabetes mellitus without complications: Secondary | ICD-10-CM | POA: Insufficient documentation

## 2023-05-15 DIAGNOSIS — N179 Acute kidney failure, unspecified: Secondary | ICD-10-CM | POA: Insufficient documentation

## 2023-05-15 DIAGNOSIS — R1030 Lower abdominal pain, unspecified: Secondary | ICD-10-CM | POA: Diagnosis not present

## 2023-05-15 DIAGNOSIS — N183 Chronic kidney disease, stage 3 unspecified: Secondary | ICD-10-CM | POA: Diagnosis not present

## 2023-05-15 DIAGNOSIS — R109 Unspecified abdominal pain: Secondary | ICD-10-CM | POA: Diagnosis present

## 2023-05-15 DIAGNOSIS — R0789 Other chest pain: Secondary | ICD-10-CM | POA: Diagnosis not present

## 2023-05-15 LAB — URINALYSIS, ROUTINE W REFLEX MICROSCOPIC
Bacteria, UA: NONE SEEN
Bilirubin Urine: NEGATIVE
Glucose, UA: NEGATIVE mg/dL
Ketones, ur: NEGATIVE mg/dL
Leukocytes,Ua: NEGATIVE
Nitrite: NEGATIVE
Protein, ur: 100 mg/dL — AB
Specific Gravity, Urine: 1.015 (ref 1.005–1.030)
Squamous Epithelial / HPF: 0 /[HPF] (ref 0–5)
pH: 5 (ref 5.0–8.0)

## 2023-05-15 LAB — CBC
HCT: 34.8 % — ABNORMAL LOW (ref 36.0–46.0)
Hemoglobin: 12 g/dL (ref 12.0–15.0)
MCH: 30.2 pg (ref 26.0–34.0)
MCHC: 34.5 g/dL (ref 30.0–36.0)
MCV: 87.4 fL (ref 80.0–100.0)
Platelets: 268 10*3/uL (ref 150–400)
RBC: 3.98 MIL/uL (ref 3.87–5.11)
RDW: 12.8 % (ref 11.5–15.5)
WBC: 7.5 10*3/uL (ref 4.0–10.5)
nRBC: 0 % (ref 0.0–0.2)

## 2023-05-15 LAB — BASIC METABOLIC PANEL
Anion gap: 9 (ref 5–15)
BUN: 22 mg/dL — ABNORMAL HIGH (ref 6–20)
CO2: 21 mmol/L — ABNORMAL LOW (ref 22–32)
Calcium: 8.3 mg/dL — ABNORMAL LOW (ref 8.9–10.3)
Chloride: 105 mmol/L (ref 98–111)
Creatinine, Ser: 1.71 mg/dL — ABNORMAL HIGH (ref 0.44–1.00)
GFR, Estimated: 35 mL/min — ABNORMAL LOW (ref >60)
Glucose, Bld: 144 mg/dL — ABNORMAL HIGH (ref 70–99)
Potassium: 4.2 mmol/L (ref 3.5–5.1)
Sodium: 135 mmol/L (ref 135–145)

## 2023-05-15 LAB — HEPATIC FUNCTION PANEL
ALT: 13 U/L (ref 0–44)
AST: 24 U/L (ref 15–41)
Albumin: 3.7 g/dL (ref 3.5–5.0)
Alkaline Phosphatase: 89 U/L (ref 38–126)
Bilirubin, Direct: 0.1 mg/dL (ref 0.0–0.2)
Indirect Bilirubin: 0.5 mg/dL (ref 0.3–0.9)
Total Bilirubin: 0.6 mg/dL (ref <1.2)
Total Protein: 7.5 g/dL (ref 6.5–8.1)

## 2023-05-15 LAB — TROPONIN I (HIGH SENSITIVITY)
Troponin I (High Sensitivity): 4 ng/L (ref <18)
Troponin I (High Sensitivity): 4 ng/L (ref <18)

## 2023-05-15 LAB — LIPASE, BLOOD: Lipase: 45 U/L (ref 11–51)

## 2023-05-15 MED ORDER — IOHEXOL 300 MG/ML  SOLN
75.0000 mL | Freq: Once | INTRAMUSCULAR | Status: AC | PRN
  Administered 2023-05-15: 75 mL via INTRAVENOUS

## 2023-05-15 MED ORDER — MORPHINE SULFATE (PF) 4 MG/ML IV SOLN
4.0000 mg | Freq: Once | INTRAVENOUS | Status: AC
  Administered 2023-05-15: 4 mg via INTRAVENOUS
  Filled 2023-05-15: qty 1

## 2023-05-15 MED ORDER — SODIUM CHLORIDE 0.9 % IV BOLUS
1000.0000 mL | Freq: Once | INTRAVENOUS | Status: AC
  Administered 2023-05-15: 1000 mL via INTRAVENOUS

## 2023-05-15 NOTE — Discharge Instructions (Signed)
Please follow-up with your primary care provider in the next couple of days for reassessment.  Please return for any worsening symptoms

## 2023-05-15 NOTE — ED Notes (Signed)
Pt in xray (not to bed yet).

## 2023-05-15 NOTE — ED Triage Notes (Signed)
While at church started to have chest pain, RLQ abd pain, trouble breathing.  AAOx3.  Skin warm and dry. No SOB/ DOE NAD

## 2023-05-15 NOTE — ED Notes (Signed)
See triage notes. Patient c/o chest pain, RLQ abdominal pain and trouble breathing.

## 2023-05-15 NOTE — ED Provider Notes (Addendum)
[facility] Provider Note    Event Date/Time   First MD Initiated Contact with Patient 05/15/23 1200     (approximate)   History   Chest Pain   HPI Deanna Joseph is a 57 y.o. female with history of CKD stage III, HFpEF, DM2, Warnicke Korsakoff syndrome presenting today for abdominal pain.  Husband provides most of the history as patient states she has confusion given her Warnicke Korsakoff syndrome.  Husband states they were at church today and she is complaining of both chest pain and abdominal pain.  She then had recurrence of abdominal pain lower down on the way to the ED.  She otherwise denies shortness of breath, nausea, vomiting, diarrhea, constipation.  Only abdominal surgical history is hysterectomy.  Right now, she states only symptoms are in her abdomen.  Reviewed prior chart notes.     Physical Exam   Triage Vital Signs: ED Triage Vitals  Encounter Vitals Group     BP 05/15/23 1122 (!) 156/69     Systolic BP Percentile --      Diastolic BP Percentile --      Pulse Rate 05/15/23 1122 79     Resp 05/15/23 1122 (!) 22     Temp 05/15/23 1122 98 F (36.7 C)     Temp Source 05/15/23 1122 Oral     SpO2 05/15/23 1122 100 %     Weight 05/15/23 1120 167 lb 1.7 oz (75.8 kg)     Height --      Head Circumference --      Peak Flow --      Pain Score 05/15/23 1119 9     Pain Loc --      Pain Education --      Exclude from Growth Chart --     Most recent vital signs: Vitals:   05/15/23 1430 05/15/23 1444  BP:  (!) 160/66  Pulse:  76  Resp:  18  Temp: 98.1 F (36.7 C) 98.1 F (36.7 C)  SpO2:  100%   Physical Exam: I have reviewed the vital signs and nursing notes. General: Awake, alert, no acute distress.  Nontoxic appearing. Head:  Atraumatic, normocephalic.   ENT:  EOM intact, PERRL. Oral mucosa is pink and moist with no lesions. Neck: Neck is supple with full range of motion, No meningeal signs. Cardiovascular:  RRR, No murmurs.  Peripheral pulses palpable and equal bilaterally. Respiratory:  Symmetrical chest wall expansion.  No rhonchi, rales, or wheezes.  Good air movement throughout.  No use of accessory muscles.   Musculoskeletal:  No cyanosis or edema. Moving extremities with full ROM Abdomen:  Soft, tenderness to palpation most prominently in right lower quadrant and left lower quadrant, nondistended. Neuro:  GCS 15, moving all four extremities, interacting appropriately. Speech clear. Psych:  Calm, appropriate.   Skin:  Warm, dry, no rash.    ED Results / Procedures / Treatments   Labs (all labs ordered are listed, but only abnormal results are displayed) Labs Reviewed  BASIC METABOLIC PANEL - Abnormal; Notable for the following components:      Result Value   CO2 21 (*)    Glucose, Bld 144 (*)    BUN 22 (*)    Creatinine, Ser 1.71 (*)    Calcium 8.3 (*)    GFR, Estimated 35 (*)    All other components within normal limits  CBC - Abnormal; Notable for the following components:   HCT 34.8 (*)  All other components within normal limits  URINALYSIS, ROUTINE W REFLEX MICROSCOPIC - Abnormal; Notable for the following components:   Color, Urine STRAW (*)    APPearance CLEAR (*)    Hgb urine dipstick MODERATE (*)    Protein, ur 100 (*)    All other components within normal limits  HEPATIC FUNCTION PANEL  LIPASE, BLOOD  TROPONIN I (HIGH SENSITIVITY)  TROPONIN I (HIGH SENSITIVITY)     EKG My EKG interpretation: Rate of 78, normal sinus rhythm, normal axis.  Normal intervals.  No acute ST elevations or depressions   RADIOLOGY Independently interpreted chest x-ray with no acute pathology   PROCEDURES:  Critical Care performed: No  Procedures   MEDICATIONS ORDERED IN ED: Medications  morphine (PF) 4 MG/ML injection 4 mg (4 mg Intravenous Given 05/15/23 1334)  iohexol (OMNIPAQUE) 300 MG/ML solution 75 mL (75 mLs Intravenous Contrast Given 05/15/23 1317)  sodium chloride 0.9 % bolus 1,000  mL (1,000 mLs Intravenous New Bag/Given 05/15/23 1347)     IMPRESSION / MDM / ASSESSMENT AND PLAN / ED COURSE  I reviewed the triage vital signs and the nursing notes.                              Differential diagnosis includes, but is not limited to, appendicitis, diverticulitis, colitis, pyelonephritis, UTI, lower suspicion ACS  Patient's presentation is most consistent with acute complicated illness / injury requiring diagnostic workup.  Patient is a 57 year old female presenting today most prominently for lower abdominal pain in bilateral quadrants.  Briefly had some chest pain and shortness of breath symptoms but those have completely resolved and were only present when the abdominal pain was there.  Vital signs for the most part reassuring.  Exam consistent with tenderness in the lower abdomen.  Will get laboratory workup as well as CT abdomen/pelvis to evaluate for any acute intra-abdominal infections.  EKG with no signs of ischemia and troponin negative x 2.  No active chest pain.  BMP notable for slight AKI from her baseline patient was given 1 L of fluids.  Laboratory workup otherwise unremarkable.  UA negative.  CT abdomen/pelvis was performed given abdominal pain complaints and was also unremarkable.  Chest x-ray unremarkable.  Patient was reassessed and feeling well at this time with no ongoing pain symptoms.  Able to tolerate p.o. without issue.  I do feel she is safe for discharge and told her follow-up with her primary care provider in the next week for reassessment as well as lab recheck to ensure improvement in creatinine function.  Given strict return precautions.  The patient is on the cardiac monitor to evaluate for evidence of arrhythmia and/or significant heart rate changes. Clinical Course as of 05/15/23 1449  Sun May 15, 2023  1417 Urinalysis, Routine w reflex microscopic -Urine, Clean Catch(!) No UTI [DW]    Clinical Course User Index [DW] Janith Lima, MD      FINAL CLINICAL IMPRESSION(S) / ED DIAGNOSES   Final diagnoses:  Abdominal pain, lower  AKI (acute kidney injury) (HCC)     Rx / DC Orders   ED Discharge Orders     None        Note:  This document was prepared using Dragon voice recognition software and may include unintentional dictation errors.   Janith Lima, MD 05/15/23 1448    Janith Lima, MD 05/15/23 [phone]

## 2023-05-15 NOTE — ED Notes (Signed)
Patient provided with a zero sugar shasta cola for PO challenge.

## 2023-05-19 DIAGNOSIS — N183 Chronic kidney disease, stage 3 unspecified: Secondary | ICD-10-CM | POA: Diagnosis not present

## 2023-05-19 DIAGNOSIS — E119 Type 2 diabetes mellitus without complications: Secondary | ICD-10-CM | POA: Diagnosis not present

## 2023-05-19 DIAGNOSIS — M129 Arthropathy, unspecified: Secondary | ICD-10-CM | POA: Diagnosis not present

## 2023-05-19 DIAGNOSIS — Z712 Person consulting for explanation of examination or test findings: Secondary | ICD-10-CM | POA: Diagnosis not present

## 2023-05-19 DIAGNOSIS — F0390 Unspecified dementia without behavioral disturbance: Secondary | ICD-10-CM | POA: Diagnosis not present

## 2023-05-19 DIAGNOSIS — R269 Unspecified abnormalities of gait and mobility: Secondary | ICD-10-CM | POA: Diagnosis not present

## 2023-05-25 DIAGNOSIS — R269 Unspecified abnormalities of gait and mobility: Secondary | ICD-10-CM | POA: Diagnosis not present

## 2023-05-25 DIAGNOSIS — I509 Heart failure, unspecified: Secondary | ICD-10-CM | POA: Diagnosis not present

## 2023-06-25 DIAGNOSIS — R269 Unspecified abnormalities of gait and mobility: Secondary | ICD-10-CM | POA: Diagnosis not present

## 2023-06-25 DIAGNOSIS — I509 Heart failure, unspecified: Secondary | ICD-10-CM | POA: Diagnosis not present

## 2023-07-26 DIAGNOSIS — I509 Heart failure, unspecified: Secondary | ICD-10-CM | POA: Diagnosis not present

## 2023-07-26 DIAGNOSIS — R269 Unspecified abnormalities of gait and mobility: Secondary | ICD-10-CM | POA: Diagnosis not present

## 2023-08-23 DIAGNOSIS — I509 Heart failure, unspecified: Secondary | ICD-10-CM | POA: Diagnosis not present

## 2023-08-23 DIAGNOSIS — R269 Unspecified abnormalities of gait and mobility: Secondary | ICD-10-CM | POA: Diagnosis not present

## 2023-09-23 DIAGNOSIS — R269 Unspecified abnormalities of gait and mobility: Secondary | ICD-10-CM | POA: Diagnosis not present

## 2023-09-23 DIAGNOSIS — I509 Heart failure, unspecified: Secondary | ICD-10-CM | POA: Diagnosis not present

## 2023-09-29 ENCOUNTER — Emergency Department

## 2023-09-29 ENCOUNTER — Other Ambulatory Visit: Payer: Self-pay

## 2023-09-29 ENCOUNTER — Emergency Department
Admission: EM | Admit: 2023-09-29 | Discharge: 2023-09-30 | Disposition: A | Discharge status: Home / Self Care | Attending: Emergency Medicine | Admitting: Emergency Medicine

## 2023-09-29 DIAGNOSIS — I129 Hypertensive chronic kidney disease with stage 1 through stage 4 chronic kidney disease, or unspecified chronic kidney disease: Secondary | ICD-10-CM | POA: Diagnosis not present

## 2023-09-29 DIAGNOSIS — N183 Chronic kidney disease, stage 3 unspecified: Secondary | ICD-10-CM | POA: Insufficient documentation

## 2023-09-29 DIAGNOSIS — R109 Unspecified abdominal pain: Secondary | ICD-10-CM | POA: Diagnosis present

## 2023-09-29 DIAGNOSIS — E871 Hypo-osmolality and hyponatremia: Secondary | ICD-10-CM | POA: Insufficient documentation

## 2023-09-29 DIAGNOSIS — N179 Acute kidney failure, unspecified: Secondary | ICD-10-CM | POA: Insufficient documentation

## 2023-09-29 DIAGNOSIS — E1122 Type 2 diabetes mellitus with diabetic chronic kidney disease: Secondary | ICD-10-CM | POA: Diagnosis not present

## 2023-09-29 DIAGNOSIS — R1084 Generalized abdominal pain: Secondary | ICD-10-CM | POA: Diagnosis not present

## 2023-09-29 DIAGNOSIS — R0789 Other chest pain: Secondary | ICD-10-CM | POA: Diagnosis not present

## 2023-09-29 LAB — COMPREHENSIVE METABOLIC PANEL WITH GFR
ALT: 15 U/L (ref 0–44)
AST: 28 U/L (ref 15–41)
Albumin: 3.6 g/dL (ref 3.5–5.0)
Alkaline Phosphatase: 59 U/L (ref 38–126)
Anion gap: 15 (ref 5–15)
BUN: 31 mg/dL — ABNORMAL HIGH (ref 6–20)
CO2: 17 mmol/L — ABNORMAL LOW (ref 22–32)
Calcium: 8.5 mg/dL — ABNORMAL LOW (ref 8.9–10.3)
Chloride: 96 mmol/L — ABNORMAL LOW (ref 98–111)
Creatinine, Ser: 2.07 mg/dL — ABNORMAL HIGH (ref 0.44–1.00)
GFR, Estimated: 27 mL/min — ABNORMAL LOW (ref >60)
Glucose, Bld: 50 mg/dL — ABNORMAL LOW (ref 70–99)
Potassium: 4 mmol/L (ref 3.5–5.1)
Sodium: 128 mmol/L — ABNORMAL LOW (ref 135–145)
Total Bilirubin: 1.4 mg/dL — ABNORMAL HIGH (ref 0.0–1.2)
Total Protein: 6.9 g/dL (ref 6.5–8.1)

## 2023-09-29 LAB — TROPONIN I (HIGH SENSITIVITY)
Troponin I (High Sensitivity): 4 ng/L (ref <18)
Troponin I (High Sensitivity): 7 ng/L (ref <18)

## 2023-09-29 LAB — CBC
HCT: 40.9 % (ref 36.0–46.0)
Hemoglobin: 13.5 g/dL (ref 12.0–15.0)
MCH: 30 pg (ref 26.0–34.0)
MCHC: 33 g/dL (ref 30.0–36.0)
MCV: 90.9 fL (ref 80.0–100.0)
Platelets: 248 10*3/uL (ref 150–400)
RBC: 4.5 MIL/uL (ref 3.87–5.11)
RDW: 13 % (ref 11.5–15.5)
WBC: 5.8 10*3/uL (ref 4.0–10.5)
nRBC: 0 % (ref 0.0–0.2)

## 2023-09-29 LAB — CBG MONITORING, ED: Glucose-Capillary: 130 mg/dL — ABNORMAL HIGH (ref 70–99)

## 2023-09-29 LAB — LIPASE, BLOOD: Lipase: 47 U/L (ref 11–51)

## 2023-09-29 MED ORDER — DICYCLOMINE HCL 10 MG PO CAPS: 10.0000 mg | ORAL_CAPSULE | Freq: Two times a day (BID) | ORAL | 0 refills | Status: AC | PRN

## 2023-09-29 MED ORDER — LACTATED RINGERS IV BOLUS
1000.0000 mL | Freq: Once | INTRAVENOUS | Status: AC
  Administered 2023-09-29: 1000 mL via INTRAVENOUS

## 2023-09-29 MED ORDER — MORPHINE SULFATE (PF) 4 MG/ML IV SOLN
4.0000 mg | Freq: Once | INTRAVENOUS | Status: AC
  Administered 2023-09-29: 4 mg via INTRAVENOUS
  Filled 2023-09-29: qty 1

## 2023-09-29 MED ORDER — DEXTROSE 10 % IV BOLUS
250.0000 mL | Freq: Once | INTRAVENOUS | Status: AC
  Administered 2023-09-29: 250 mL via INTRAVENOUS
  Filled 2023-09-29: qty 500

## 2023-09-29 NOTE — ED Triage Notes (Signed)
 Pt to ED via POV c/o CP and generalized body pain that started about an hr ago. Pt says pain started after eating pot pie. Pain radiates downwards to mid abdomen. Reports some SHOB. Denies dizziness, fevers.

## 2023-09-29 NOTE — ED Provider Notes (Signed)
 [facility] Provider Note    Event Date/Time   First MD Initiated Contact with Patient 09/29/23 2108     (approximate)   History   Chest Pain and Generalized Body Aches   HPI Deanna Joseph is a 58 y.o. female with history of HTN, GERD, DM2, CKD stage III presenting today for abdominal pain.  Patient states she has had chronic abdominal pain but in the past couple of hours has had worsening right sided abdominal pain which she describes as sharp.  No nausea or vomiting.  Prior history of cholecystectomy.  Denies any diarrhea or constipation.  Separately she has also had intermittent left-sided chest pain.  She has not had any shortness of breath and currently denies having the chest pain at this time.  No dysuria symptoms.     Physical Exam   Triage Vital Signs: ED Triage Vitals  Encounter Vitals Group     BP 09/29/23 1921 124/72     Systolic BP Percentile --      Diastolic BP Percentile --      Pulse Rate 09/29/23 1921 100     Resp 09/29/23 1921 20     Temp 09/29/23 1921 97.8 F (36.6 C)     Temp Source 09/29/23 1921 Oral     SpO2 09/29/23 1921 100 %     Weight --      Height --      Head Circumference --      Peak Flow --      Pain Score 09/29/23 1915 8     Pain Loc --      Pain Education --      Exclude from Growth Chart --     Most recent vital signs: Vitals:   09/29/23 1921 09/29/23 2130  BP: 124/72 128/70  Pulse: 100 72  Resp: 20 15  Temp: 97.8 F (36.6 C)   SpO2: 100% 100%   Physical Exam: I have reviewed the vital signs and nursing notes. General: Awake, alert, no acute distress.  Nontoxic appearing. Head:  Atraumatic, normocephalic.   ENT:  EOM intact, PERRL. Oral mucosa is pink and moist with no lesions. Neck: Neck is supple with full range of motion, No meningeal signs. Cardiovascular:  RRR, No murmurs. Peripheral pulses palpable and equal bilaterally. Respiratory:  Symmetrical chest wall expansion.  No rhonchi, rales,  or wheezes.  Good air movement throughout.  No use of accessory muscles.   Musculoskeletal:  No cyanosis or edema. Moving extremities with full ROM Abdomen:  Soft, tenderness to palpation in right flank, nondistended. Neuro:  GCS 15, moving all four extremities, interacting appropriately. Speech clear. Psych:  Calm, appropriate.   Skin:  Warm, dry, no rash.    ED Results / Procedures / Treatments   Labs (all labs ordered are listed, but only abnormal results are displayed) Labs Reviewed  COMPREHENSIVE METABOLIC PANEL WITH GFR - Abnormal; Notable for the following components:      Result Value   Sodium 128 (*)    Chloride 96 (*)    CO2 17 (*)    Glucose, Bld 50 (*)    BUN 31 (*)    Creatinine, Ser 2.07 (*)    Calcium  8.5 (*)    Total Bilirubin 1.4 (*)    GFR, Estimated 27 (*)    All other components within normal limits  CBC  LIPASE, BLOOD  CBG MONITORING, ED  TROPONIN I (HIGH SENSITIVITY)  TROPONIN I (HIGH SENSITIVITY)  EKG My EKG interpretation: Rate of 100, normal sinus rhythm, normal axis, normal intervals.  No acute ST elevations or depressions   RADIOLOGY Independently interpreted CT abdomen/pelvis with no acute findings.  Independently interpreted chest x-ray with no acute pathology   PROCEDURES:  Critical Care performed: No  Procedures   MEDICATIONS ORDERED IN ED: Medications  dextrose  (D10W) 10% bolus 250 mL (250 mLs Intravenous New Bag/Given 09/29/23 2300)  morphine  (PF) 4 MG/ML injection 4 mg (4 mg Intravenous Given 09/29/23 2158)  lactated ringers  bolus 1,000 mL (1,000 mLs Intravenous New Bag/Given 09/29/23 2300)     IMPRESSION / MDM / ASSESSMENT AND PLAN / ED COURSE  I reviewed the triage vital signs and the nursing notes.                              Differential diagnosis includes, but is not limited to, pancreatitis, hepatitis, nephrolithiasis, pyelonephritis, appendicitis, colitis, lower concern ACS  Patient's presentation is most  consistent with acute complicated illness / injury requiring diagnostic workup.  Patient is a 58 year old female presenting today for right-sided abdominal pain.  Sharp with tenderness to palpation at the site.  No tenderness elsewhere throughout the abdomen.  Otherwise unremarkable physical exam and vital signs are stable.  Her chest pain is intermittent and EKG shows no signs of ischemia.  No other associated symptoms with this and may be more related to her abdominal pain complaints.  CBC unremarkable.  Lipase normal.  Patient given morphine  for pain symptoms.  Chest x-ray unremarkable.  Troponin negative x 2.  CT abdomen/pelvis with no acute intra-abdominal pathology.  Patient was reassessed with no ongoing pain symptoms.  No concern for ACS given very intermittent right-sided chest pain symptoms which are not present here today and no associated symptoms.  Will try Bentyl  to see if that provides any relief with otherwise reassuring workup.  Separately also discussed her following up with her PCP within a week given her gradually declining kidney function over the past 12 months.  No indication for immediate admission at this time.  She was agreeable with plan.  Blood glucose read corrected after getting D10 bolus.  The patient is on the cardiac monitor to evaluate for evidence of arrhythmia and/or significant heart rate changes.     FINAL CLINICAL IMPRESSION(S) / ED DIAGNOSES   Final diagnoses:  Right sided abdominal pain  AKI (acute kidney injury) (HCC)     Rx / DC Orders   ED Discharge Orders          Ordered    dicyclomine  (BENTYL ) 10 MG capsule  2 times daily PRN        09/29/23 2325             Note:  This document was prepared using Dragon voice recognition software and may include unintentional dictation errors.   Kandee Orion, MD 09/29/23 [phone]

## 2023-09-29 NOTE — Discharge Instructions (Signed)
 CT imaging of your abdomen/pelvis was reassuring today with no acute findings.  I have sent a medication to your pharmacy to attempt to help with your pain symptoms.  Separately, I have noticed a generally declining kidney function over the past 12 months.  You were given fluids to help with this today.  Please follow-up with your primary care provider in the next week so they can continue to monitor this.  If your kidney function continues to get worse you may need to be hospitalized or to see a kidney doctor.  Please return for any severe worsening symptoms.

## 2023-10-12 DIAGNOSIS — E119 Type 2 diabetes mellitus without complications: Secondary | ICD-10-CM | POA: Diagnosis not present

## 2023-10-12 DIAGNOSIS — Z712 Person consulting for explanation of examination or test findings: Secondary | ICD-10-CM | POA: Diagnosis not present

## 2023-10-12 DIAGNOSIS — F0390 Unspecified dementia without behavioral disturbance: Secondary | ICD-10-CM | POA: Diagnosis not present

## 2023-10-12 DIAGNOSIS — F418 Other specified anxiety disorders: Secondary | ICD-10-CM | POA: Diagnosis not present

## 2023-10-12 DIAGNOSIS — Z1389 Encounter for screening for other disorder: Secondary | ICD-10-CM | POA: Diagnosis not present

## 2023-10-12 DIAGNOSIS — R634 Abnormal weight loss: Secondary | ICD-10-CM | POA: Diagnosis not present

## 2023-10-18 DIAGNOSIS — Z1389 Encounter for screening for other disorder: Secondary | ICD-10-CM | POA: Diagnosis not present

## 2023-10-18 DIAGNOSIS — M7989 Other specified soft tissue disorders: Secondary | ICD-10-CM | POA: Diagnosis not present

## 2023-10-18 DIAGNOSIS — Z712 Person consulting for explanation of examination or test findings: Secondary | ICD-10-CM | POA: Diagnosis not present

## 2023-10-18 DIAGNOSIS — R531 Weakness: Secondary | ICD-10-CM | POA: Diagnosis not present

## 2023-10-18 DIAGNOSIS — G4700 Insomnia, unspecified: Secondary | ICD-10-CM | POA: Diagnosis not present

## 2023-10-20 ENCOUNTER — Emergency Department

## 2023-10-20 ENCOUNTER — Observation Stay
Admission: EM | Admit: 2023-10-20 | Discharge: 2023-10-25 | Disposition: A | Discharge status: Home Health | Attending: Internal Medicine | Admitting: Internal Medicine

## 2023-10-20 ENCOUNTER — Other Ambulatory Visit: Payer: Self-pay

## 2023-10-20 ENCOUNTER — Encounter: Payer: Self-pay | Admitting: Emergency Medicine

## 2023-10-20 DIAGNOSIS — E1122 Type 2 diabetes mellitus with diabetic chronic kidney disease: Secondary | ICD-10-CM | POA: Diagnosis not present

## 2023-10-20 DIAGNOSIS — R41840 Attention and concentration deficit: Secondary | ICD-10-CM | POA: Insufficient documentation

## 2023-10-20 DIAGNOSIS — R801 Persistent proteinuria, unspecified: Secondary | ICD-10-CM | POA: Insufficient documentation

## 2023-10-20 DIAGNOSIS — I9589 Other hypotension: Secondary | ICD-10-CM | POA: Diagnosis not present

## 2023-10-20 DIAGNOSIS — R9089 Other abnormal findings on diagnostic imaging of central nervous system: Secondary | ICD-10-CM | POA: Diagnosis not present

## 2023-10-20 DIAGNOSIS — F32A Depression, unspecified: Secondary | ICD-10-CM | POA: Insufficient documentation

## 2023-10-20 DIAGNOSIS — I130 Hypertensive heart and chronic kidney disease with heart failure and stage 1 through stage 4 chronic kidney disease, or unspecified chronic kidney disease: Secondary | ICD-10-CM | POA: Diagnosis not present

## 2023-10-20 DIAGNOSIS — F989 Unspecified behavioral and emotional disorders with onset usually occurring in childhood and adolescence: Secondary | ICD-10-CM | POA: Diagnosis not present

## 2023-10-20 DIAGNOSIS — Z79899 Other long term (current) drug therapy: Secondary | ICD-10-CM | POA: Diagnosis not present

## 2023-10-20 DIAGNOSIS — R4189 Other symptoms and signs involving cognitive functions and awareness: Secondary | ICD-10-CM | POA: Diagnosis present

## 2023-10-20 DIAGNOSIS — G9389 Other specified disorders of brain: Secondary | ICD-10-CM | POA: Diagnosis not present

## 2023-10-20 DIAGNOSIS — K219 Gastro-esophageal reflux disease without esophagitis: Secondary | ICD-10-CM | POA: Diagnosis not present

## 2023-10-20 DIAGNOSIS — R443 Hallucinations, unspecified: Secondary | ICD-10-CM | POA: Insufficient documentation

## 2023-10-20 DIAGNOSIS — N179 Acute kidney failure, unspecified: Secondary | ICD-10-CM | POA: Insufficient documentation

## 2023-10-20 DIAGNOSIS — E872 Acidosis, unspecified: Secondary | ICD-10-CM | POA: Diagnosis present

## 2023-10-20 DIAGNOSIS — E11649 Type 2 diabetes mellitus with hypoglycemia without coma: Secondary | ICD-10-CM | POA: Diagnosis not present

## 2023-10-20 DIAGNOSIS — Z8673 Personal history of transient ischemic attack (TIA), and cerebral infarction without residual deficits: Secondary | ICD-10-CM | POA: Diagnosis not present

## 2023-10-20 DIAGNOSIS — I959 Hypotension, unspecified: Secondary | ICD-10-CM | POA: Diagnosis present

## 2023-10-20 DIAGNOSIS — G9341 Metabolic encephalopathy: Secondary | ICD-10-CM | POA: Insufficient documentation

## 2023-10-20 DIAGNOSIS — N1832 Chronic kidney disease, stage 3b: Secondary | ICD-10-CM | POA: Insufficient documentation

## 2023-10-20 DIAGNOSIS — R4689 Other symptoms and signs involving appearance and behavior: Principal | ICD-10-CM

## 2023-10-20 DIAGNOSIS — I5032 Chronic diastolic (congestive) heart failure: Secondary | ICD-10-CM | POA: Insufficient documentation

## 2023-10-20 DIAGNOSIS — G4700 Insomnia, unspecified: Secondary | ICD-10-CM | POA: Diagnosis not present

## 2023-10-20 DIAGNOSIS — E119 Type 2 diabetes mellitus without complications: Secondary | ICD-10-CM

## 2023-10-20 DIAGNOSIS — R0789 Other chest pain: Secondary | ICD-10-CM | POA: Diagnosis not present

## 2023-10-20 DIAGNOSIS — D631 Anemia in chronic kidney disease: Secondary | ICD-10-CM | POA: Insufficient documentation

## 2023-10-20 DIAGNOSIS — R4182 Altered mental status, unspecified: Principal | ICD-10-CM | POA: Insufficient documentation

## 2023-10-20 LAB — BASIC METABOLIC PANEL WITH GFR
Anion gap: 19 — ABNORMAL HIGH (ref 5–15)
BUN: 32 mg/dL — ABNORMAL HIGH (ref 6–20)
CO2: 16 mmol/L — ABNORMAL LOW (ref 22–32)
Calcium: 9.1 mg/dL (ref 8.9–10.3)
Chloride: 100 mmol/L (ref 98–111)
Creatinine, Ser: 2.01 mg/dL — ABNORMAL HIGH (ref 0.44–1.00)
GFR, Estimated: 28 mL/min — ABNORMAL LOW (ref >60)
Glucose, Bld: 70 mg/dL (ref 70–99)
Potassium: 4.1 mmol/L (ref 3.5–5.1)
Sodium: 135 mmol/L (ref 135–145)

## 2023-10-20 LAB — URINALYSIS, ROUTINE W REFLEX MICROSCOPIC
Bilirubin Urine: NEGATIVE
Glucose, UA: NEGATIVE mg/dL
Ketones, ur: 20 mg/dL — AB
Leukocytes,Ua: NEGATIVE
Nitrite: NEGATIVE
Protein, ur: 100 mg/dL — AB
Specific Gravity, Urine: 1.011 (ref 1.005–1.030)
pH: 5 (ref 5.0–8.0)

## 2023-10-20 LAB — HEPATIC FUNCTION PANEL
ALT: 20 U/L (ref 0–44)
AST: 30 U/L (ref 15–41)
Albumin: 4.3 g/dL (ref 3.5–5.0)
Alkaline Phosphatase: 59 U/L (ref 38–126)
Bilirubin, Direct: 0.1 mg/dL (ref 0.0–0.2)
Indirect Bilirubin: 1.2 mg/dL — ABNORMAL HIGH (ref 0.3–0.9)
Total Bilirubin: 1.3 mg/dL — ABNORMAL HIGH (ref 0.0–1.2)
Total Protein: 7.6 g/dL (ref 6.5–8.1)

## 2023-10-20 LAB — TSH: TSH: 1.733 u[IU]/mL (ref 0.350–4.500)

## 2023-10-20 LAB — CBC
HCT: 35.2 % — ABNORMAL LOW (ref 36.0–46.0)
Hemoglobin: 11.8 g/dL — ABNORMAL LOW (ref 12.0–15.0)
MCH: 29.9 pg (ref 26.0–34.0)
MCHC: 33.5 g/dL (ref 30.0–36.0)
MCV: 89.3 fL (ref 80.0–100.0)
Platelets: 261 10*3/uL (ref 150–400)
RBC: 3.94 MIL/uL (ref 3.87–5.11)
RDW: 13.5 % (ref 11.5–15.5)
WBC: 6.1 10*3/uL (ref 4.0–10.5)
nRBC: 0 % (ref 0.0–0.2)

## 2023-10-20 LAB — TROPONIN I (HIGH SENSITIVITY): Troponin I (High Sensitivity): 11 ng/L (ref <18)

## 2023-10-20 MED ORDER — LORAZEPAM 2 MG/ML IJ SOLN
1.0000 mg | Freq: Once | INTRAMUSCULAR | Status: AC
  Administered 2023-10-20: 1 mg via INTRAVENOUS
  Filled 2023-10-20: qty 1

## 2023-10-20 MED ORDER — GADOBUTROL 1 MMOL/ML IV SOLN
5.0000 mL | Freq: Once | INTRAVENOUS | Status: AC | PRN
  Administered 2023-10-20: 5 mL via INTRAVENOUS

## 2023-10-20 MED ORDER — LORAZEPAM 2 MG/ML IJ SOLN
1.0000 mg | Freq: Once | INTRAMUSCULAR | Status: AC | PRN
  Administered 2023-10-20: 1 mg via INTRAVENOUS
  Filled 2023-10-20: qty 1

## 2023-10-20 NOTE — ED Notes (Signed)
 Patient ambulated to the bathroom at this time with this RN and Destiny, Charity fundraiser. Patient was very unsteady when ambulating. Patient placed back in bed and hooked back up. Bed alarm on and activated, non skid sock already in place. Bed low and locked at this time. Patient is still confused and agitated and pulling at everything despite medications. Patients door in left open for safety, lights dimmed, warm blankets placed on patient, bed alarm on and active.

## 2023-10-20 NOTE — ED Provider Notes (Signed)
 [facility] Provider Note    Event Date/Time   First MD Initiated Contact with Patient 10/20/23 1734     (approximate)   History   Chest Pain   HPI  Deanna Joseph is a 58 y.o. female with a history of hypertension, HFpEF, diabetes, CKD stage III, and GERD who presents with multiple complaints which are chronic but worsening.  The patient's husband is the primary historian.  He states that she has had poor sleep over some time, but it has worsened this week.  She was started on lorazepam  in addition to trazodone  for sleep, and has been sleeping better, but now has also been intermittently disoriented throughout the day.  She has had increased anxiety.  She also reports some abdominal discomfort and had reported chest pain in triage although denies any chest pain now.  She does report some difficulty breathing.  She has no vomiting or diarrhea.  She has a very strong appetite and often wants to eat every 15 minutes.  She has no fever.  She was seen by her primary care provider and instructed to come to the ED for further evaluation.  I reviewed the past medical records.  The patient was seen in the ED on 4/24 with abdominal and pain.  Workup was negative at that time.  Previously she was seen in December also for abdominal pain with a negative workup.  She was last admitted to the hospital service in August of last year for chest pain with minimally elevated troponins.   Physical Exam   Triage Vital Signs: ED Triage Vitals [10/20/23 1622]  Encounter Vitals Group     BP 137/72     Systolic BP Percentile      Diastolic BP Percentile      Pulse Rate (!) 109     Resp 17     Temp 98.1 F (36.7 C)     Temp Source Oral     SpO2 100 %     Weight 124 lb (56.2 kg)     Height 5' (1.524 m)     Head Circumference      Peak Flow      Pain Score 7     Pain Loc      Pain Education      Exclude from Growth Chart     Most recent vital signs: Vitals:   10/20/23  2315 10/20/23 2330  BP: (!) 114/92 135/75  Pulse: 88 91  Resp: 18 14  Temp:    SpO2: 100% 100%     General: Alert, oriented x 2, anxious appearing but in no distress.  CV:  Good peripheral perfusion.  Resp:  Normal effort.  Lungs CTAB. Abd:  Soft and nontender.  No distention.  Other:  EOMI.  PERRLA.  No photophobia.  Normal speech.  No facial droop.  Motor intact in all extremities.  Dry mucous membranes.   ED Results / Procedures / Treatments   Labs (all labs ordered are listed, but only abnormal results are displayed) Labs Reviewed  BASIC METABOLIC PANEL WITH GFR - Abnormal; Notable for the following components:      Result Value   CO2 16 (*)    BUN 32 (*)    Creatinine, Ser 2.01 (*)    GFR, Estimated 28 (*)    Anion gap 19 (*)    All other components within normal limits  CBC - Abnormal; Notable for the following components:   Hemoglobin 11.8 (*)  HCT 35.2 (*)    All other components within normal limits  URINALYSIS, ROUTINE W REFLEX MICROSCOPIC - Abnormal; Notable for the following components:   Color, Urine YELLOW (*)    APPearance HAZY (*)    Hgb urine dipstick MODERATE (*)    Ketones, ur 20 (*)    Protein, ur 100 (*)    Bacteria, UA RARE (*)    All other components within normal limits  HEPATIC FUNCTION PANEL - Abnormal; Notable for the following components:   Total Bilirubin 1.3 (*)    Indirect Bilirubin 1.2 (*)    All other components within normal limits  TSH  TROPONIN I (HIGH SENSITIVITY)     EKG  ED ECG REPORT I, Lind Repine, the attending physician, personally viewed and interpreted this ECG.  Date: 10/20/2023 EKG Time: 1624 Rate: 111 Rhythm: sinus tachycardia QRS Axis: normal Intervals: normal ST/T Wave abnormalities: LVH with repolarization abnormality Narrative Interpretation: no evidence of acute ischemia    RADIOLOGY  Chest x-ray: I independently viewed and interpreted the images; there is no focal consolidation or  edema  CT head: No acute abnormality  MR brain: No acute abnormality  PROCEDURES:  Critical Care performed: No  Procedures   MEDICATIONS ORDERED IN ED: Medications  LORazepam  (ATIVAN ) injection 1 mg (1 mg Intravenous Given 10/20/23 2114)  LORazepam  (ATIVAN ) injection 1 mg (1 mg Intravenous Given 10/20/23 2256)  gadobutrol  (GADAVIST ) 1 MMOL/ML injection 5 mL (5 mLs Intravenous Contrast Given 10/20/23 2132)     IMPRESSION / MDM / ASSESSMENT AND PLAN / ED COURSE  I reviewed the triage vital signs and the nursing notes.  58 year old female with PMH as noted above presents with apparent increased episodes of confusion, anxiety, and sleep difficulty which have been chronic but the confusion has worsened over the last week.  On exam the patient is alert, oriented x 2, and anxious appearing.  Neurologic exam is normal.  Physical exam is otherwise unremarkable for acute findings.  Differential diagnosis is broad and includes, but is not limited to, dehydration, electrolyte abnormality, uremia, other metabolic cause, UTI or other infection, primary CNS etiology, or primary psychiatric etiology.  BMP and CBC obtained from triage are negative.  Troponin is negative.  I have added on a CT head, urinalysis, and additional labs.  Patient's presentation is most consistent with acute presentation with potential threat to life or bodily function.  The patient is on the cardiac monitor to evaluate for evidence of arrhythmia and/or significant heart rate changes.   ----------------------------------------- 8:56 PM on 10/20/2023 -----------------------------------------  Lab workup is unremarkable.  Urinalysis is negative.  BMP shows chronic elevated creatinine with no change from baseline.  Acidosis also appears chronic.  BUN is 32.  LFTs are normal.  CBC shows no leukocytosis.  Troponin is negative.  TSH is within normal limits.  CT head shows no acute findings.  Overall presentation is most  consistent with progression of dementia or a mental health etiology.  I feel that the patient would benefit from an MRI to rule out an acute CNS cause not seen on the CT.  If this is negative she will need psychiatric evaluation.  Based on talking to the husband, she is not in a state where she can safely go home at this time.  However, there is no indication for medical admission based on the current workup.  I have ordered the MRI of the brain, Ativan  for her anxiety, and psychiatry TTS consults.  Disposition will be based  on the results of the imaging and the psychiatry evaluation.  ----------------------------------------- 11:50 PM on 10/20/2023 -----------------------------------------  MRI is negative.  Psychiatry and TTS consult evaluation are pending.  I have signed the patient out to the oncoming ED physician Dr. Dodson Freestone.  FINAL CLINICAL IMPRESSION(S) / ED DIAGNOSES   Final diagnoses:  Behavior concern     Rx / DC Orders   ED Discharge Orders     None        Note:  This document was prepared using Dragon voice recognition software and may include unintentional dictation errors.    Lind Repine, MD 10/20/23 2351

## 2023-10-20 NOTE — ED Notes (Signed)
 Patient is becoming more agitated and pulling off monitoring equipment and trying to get out of bed. MD made aware at this time. Fall bundle in place with bed alarm on at this time.

## 2023-10-20 NOTE — ED Triage Notes (Signed)
 Patient to ED via POV for generalized CP. Started Sunday. Per husband patient has had AMS during this time with hallucinations. Reports that she is hearing things and at times rocks back and forth.

## 2023-10-21 ENCOUNTER — Encounter: Payer: Self-pay | Admitting: Psychiatry

## 2023-10-21 DIAGNOSIS — G4700 Insomnia, unspecified: Secondary | ICD-10-CM | POA: Insufficient documentation

## 2023-10-21 DIAGNOSIS — G9340 Encephalopathy, unspecified: Secondary | ICD-10-CM | POA: Insufficient documentation

## 2023-10-21 DIAGNOSIS — G9341 Metabolic encephalopathy: Secondary | ICD-10-CM

## 2023-10-21 DIAGNOSIS — R4182 Altered mental status, unspecified: Secondary | ICD-10-CM | POA: Diagnosis not present

## 2023-10-21 DIAGNOSIS — R443 Hallucinations, unspecified: Secondary | ICD-10-CM | POA: Diagnosis not present

## 2023-10-21 LAB — HEMOGLOBIN A1C
Hgb A1c MFr Bld: 4.9 % (ref 4.8–5.6)
Mean Plasma Glucose: 93.93 mg/dL

## 2023-10-21 LAB — CBG MONITORING, ED
Glucose-Capillary: 46 mg/dL — ABNORMAL LOW (ref 70–99)
Glucose-Capillary: 59 mg/dL — ABNORMAL LOW (ref 70–99)
Glucose-Capillary: 85 mg/dL (ref 70–99)
Glucose-Capillary: 86 mg/dL (ref 70–99)

## 2023-10-21 LAB — GLUCOSE, CAPILLARY
Glucose-Capillary: 75 mg/dL (ref 70–99)
Glucose-Capillary: 109 mg/dL — ABNORMAL HIGH (ref 70–99)

## 2023-10-21 MED ORDER — INSULIN ASPART 100 UNIT/ML IJ SOLN
0.0000 [IU] | Freq: Three times a day (TID) | INTRAMUSCULAR | Status: DC
  Administered 2023-10-25: 2 [IU] via SUBCUTANEOUS
  Filled 2023-10-21 (×2): qty 1

## 2023-10-21 MED ORDER — TRAZODONE HCL 50 MG PO TABS
100.0000 mg | ORAL_TABLET | Freq: Every day | ORAL | Status: DC
  Administered 2023-10-21: 100 mg via ORAL
  Filled 2023-10-21: qty 2

## 2023-10-21 MED ORDER — OLANZAPINE 10 MG IM SOLR
2.5000 mg | Freq: Four times a day (QID) | INTRAMUSCULAR | Status: DC | PRN
  Administered 2023-10-21: 2.5 mg via INTRAMUSCULAR
  Filled 2023-10-21 (×2): qty 10

## 2023-10-21 MED ORDER — PANTOPRAZOLE SODIUM 40 MG PO TBEC
40.0000 mg | DELAYED_RELEASE_TABLET | Freq: Every day | ORAL | Status: DC
  Administered 2023-10-21 – 2023-10-25 (×5): 40 mg via ORAL
  Filled 2023-10-21 (×6): qty 1

## 2023-10-21 MED ORDER — ACETAMINOPHEN 500 MG PO TABS
500.0000 mg | ORAL_TABLET | Freq: Four times a day (QID) | ORAL | Status: DC | PRN
  Administered 2023-10-22 – 2023-10-23 (×2): 500 mg via ORAL
  Filled 2023-10-21 (×2): qty 1

## 2023-10-21 MED ORDER — ONDANSETRON HCL 4 MG PO TABS: 4.0000 mg | ORAL_TABLET | Freq: Four times a day (QID) | ORAL | Status: DC | PRN

## 2023-10-21 MED ORDER — ONDANSETRON HCL 4 MG/2ML IJ SOLN: 4.0000 mg | Freq: Four times a day (QID) | INTRAMUSCULAR | Status: DC | PRN

## 2023-10-21 MED ORDER — DICYCLOMINE HCL 10 MG PO CAPS: 10.0000 mg | ORAL_CAPSULE | Freq: Two times a day (BID) | ORAL | Status: DC | PRN

## 2023-10-21 MED ORDER — SODIUM BICARBONATE 650 MG PO TABS
650.0000 mg | ORAL_TABLET | Freq: Two times a day (BID) | ORAL | Status: DC
  Administered 2023-10-21 (×2): 650 mg via ORAL
  Filled 2023-10-21 (×3): qty 1

## 2023-10-21 MED ORDER — HEPARIN SODIUM (PORCINE) 5000 UNIT/ML IJ SOLN
5000.0000 [IU] | Freq: Three times a day (TID) | INTRAMUSCULAR | Status: DC
Start: 2023-10-21 — End: 2023-10-25
  Administered 2023-10-21 – 2023-10-25 (×11): 5000 [IU] via SUBCUTANEOUS
  Filled 2023-10-21 (×11): qty 1

## 2023-10-21 MED ORDER — ENSURE ENLIVE PO LIQD
237.0000 mL | Freq: Three times a day (TID) | ORAL | Status: DC
  Administered 2023-10-21 – 2023-10-25 (×10): 237 mL via ORAL

## 2023-10-21 MED ORDER — HALOPERIDOL LACTATE 5 MG/ML IJ SOLN
2.0000 mg | Freq: Once | INTRAMUSCULAR | Status: AC
  Administered 2023-10-21: 2 mg via INTRAVENOUS
  Filled 2023-10-21: qty 1

## 2023-10-21 MED ORDER — MELATONIN 5 MG PO TABS
2.5000 mg | ORAL_TABLET | Freq: Every day | ORAL | Status: DC
Start: 2023-10-21 — End: 2023-10-25
  Administered 2023-10-21 – 2023-10-24 (×4): 2.5 mg via ORAL
  Filled 2023-10-21 (×4): qty 1

## 2023-10-21 NOTE — Consult Note (Incomplete)
 [facility] Psychiatric Consult Initial  Patient Name: .Deanna Joseph  MRN: [id number]  DOB: 06-23-1965  Consult Order details:  Orders (From admission, onward)     Start     Ordered   10/20/23 2049  IP CONSULT TO PSYCHIATRY       Ordering Provider: Lind Repine, MD  Provider:  (Not yet assigned)  Question Answer Comment  Place call to: Psych provider   Reason for Consult Consult      10/20/23 2048   10/20/23 2049  CONSULT TO CALL ACT TEAM       Ordering Provider: Lind Repine, MD  Provider:  (Not yet assigned)  Question:  Reason for Consult?  Answer:  Psych consult   10/20/23 2048             Mode of Visit: Tele-visit Virtual Statement:TELE PSYCHIATRY ATTESTATION & CONSENT As the provider for this telehealth consult, I attest that I verified the patient's identity using two separate identifiers, introduced myself to the patient, provided my credentials, disclosed my location, and performed this encounter via a HIPAA-compliant, real-time, face-to-face, two-way, interactive audio and video platform and with the full consent and agreement of the patient (or guardian as applicable.) Patient physical location: [facility] ED. Telehealth provider physical location: home office in state of [place].   Video start time: 1040 Video end time: 1106    Psychiatry Consult Evaluation  Service Date: Oct 21, 2023 LOS:  LOS: 0 days  Chief Complaint "  Primary Psychiatric Diagnoses  Altered Mental Status 2.  Hallucinations 3.  Insomnia  Assessment  [name] is a 58 y.o. female admitted: Presented to the EDfor 10/20/2023  5:30 PM for evaluation of CP, hallucinations, insomnia and chronic medication issues. She carries the psychiatric diagnoses of Depression, Anxiety and has a past medical history of  HTN, GERD, acute on chronic CHF, type 2 DM, Wernicke-Korsakoff syndrome, non-alcoholic, stage 3a chronic kidney disease.   Patient initially presented to the emergency department for  evaluation of chest pain with differential diagnosis of UTI versus other primary CNS etiology or primary psychiatric concerns.    She does not have SI/HI but does endorses episodic AH---not command in nature. She reports new onset of hearing sounds but she's unable to elaborate.  She's also unable to provide a timeframe of when her symptoms started, which is not unusual considering her hx for cognitive impairment.  Her husband provided historical information on admission but was unavailable at the time of psych assessment. She has a hx for anxiety,  wernicke-korsafoff syndrome (non-alcoholic), impaired sleep, chronic kidney disease, unmanaged diabetes and is currently taking abx for urinary tract infection.  She has multiple medical co-morbidities  of which could contribute to her hallucinations.   She's notably anxious during assessment, observed fidgeting and having difficulty sitting still.   Additionally, she's currently being treated for UTI and her current U/A does not reflect active infection. Considering above, her concerns appear chronic and cannot see where they can be improved by psychiatric admission. Since her husband's primary concern was insomnia and hallucinations, recommend the following medication changes as listed below: Stop trazodone ;  -Start seroquel 12.5mg  po at bedtime for sleep, hallucinations;   -Continue Lorazepam  0.5mg  po BID prn severe anxiety. Benzodiazepines are not the best option but considering patient was already prescribed lorazepam , will defer changes to her primary prescriber.  -Recommend OP follow up with neurology for cognitive concerns.  -Therapy referral is deferred as her cognitive decline may interfere with her ability  to participate.  -Referral to [facility] for memory care information/resources  Diagnoses:  Active Hospital problems: Principal Problem:   AMS (altered mental status) Active Problems:   Insomnia    Plan   ## Psychiatric Medication  Recommendations:  -Recommend she stop trazodone ;  -Start seroquel 12.5mg  po at bedtime for sleep -Continue Lorazepam  0.5mg  po BID prn severe anxiety. Benzodiazepines are not the best option but considering patient was already prescribed lorazepam , will defer changes to her primary prescriber.   Non-medication recommendations: -Enter TOC referral for assistance with memory care unit  ## Medical Decision Making Capacity: Not specifically addressed in this encounter; that has hx for cognitive impairment, her husband is her primary caregiver and makes decisions for her.  No legal guardian paperwork on file.   ## Further Work-up:  -- Deferred to ED Provider While pt on Qtc prolonging medications, please monitor & replete K+ to 4 and Mg2+ to 2 -- most recent EKG on 10/20/2023 had QtC of 416 -- Pertinent labwork reviewed earlier this admission includes:  CMP, CBC,    ## Disposition:-- There are no psychiatric contraindications to discharge at this time  ## Behavioral / Environmental: -Utilize compassion and acknowledge the patient's experiences while setting clear and realistic expectations for care.    ## Safety and Observation Level:  - Based on my clinical evaluation, I estimate the patient to be at low risk of self harm in the current setting. - At this time, we recommend  routine. This decision is based on my review of the chart including patient's history and current presentation, interview of the patient, mental status examination, and consideration of suicide risk including evaluating suicidal ideation, plan, intent, suicidal or self-harm behaviors, risk factors, and protective factors. This judgment is based on our ability to directly address suicide risk, implement suicide prevention strategies, and develop a safety plan while the patient is in the clinical setting. Please contact our team if there is a concern that risk level has changed.  CSSR Risk Category:C-SSRS RISK CATEGORY: No  Risk  Suicide Risk Assessment: Patient has following modifiable risk factors for suicide: dx hx for cognitive impairment, sleep concerns which we are addressing by recommending seroquel for sleep and hallucinations; entering [facility] consult for memory care unit information. Patient has following non-modifiable or demographic risk factors for suicide: hx for cognitive decline Patient has the following protective factors against suicide: Supportive family, no history of suicide attempts, and no history of NSSIB  Thank you for this consult request. Recommendations have been communicated to the primary team.  We will sign off at this time.   Doneen Fuelling, NP       History of Present Illness  Relevant Aspects of Hospital ED Course:  Admitted on 10/20/2023 for CP, hallucinations.  She initially presented to the emergency with c/o chest pain was found to have underlying mental health concerns that was thought to contribute to her presentation.   Per RN Triage note dated 10/20/2023@1619 : Patient to ED via POV for generalized CP. Started Sunday. Per husband patient has had AMS during this time with hallucinations. Reports that she is hearing things and at times rocks back and forth.   Patient Report:  Patient is observed in private exam room, laying in bed, hob elevated. She is alert and oriented to person, location and concerns that led to current admission.  Patient greeted and given anticipatory guidance, she agrees to continue.  She is fairly groomed and dressed in hospital scrubs.  She appears anxious  and fidgety but in no apparent distress.  When asked about events that led to current hospitalization, she digresses to unrelated content.  Patient states,"You know what, my son's girlfriend's name is the same as mine."  She reports she came to the hospital for her health issues.  She rpeorts she eats and then throws up; she is unsure of her last meal.  She reports being anxious but is unsure why and  cannot name provocative factors.  She states she is not sure what mental health diagnosis she has.  She reports sleep as, "pretty good." She denies alcohol, marijuana or illicit drug usage. She states, "I'm note sure" to most questions, becomes upset and is tearful during the assessment.  Verbal support and encouragement extended.   She reports she does not want to hurt herself or anyone else because she does not want to go to hell.  She denies hx of NSSIB or prior suicide attempts.  She reports hearing sounds, that she cannot elaborate on, last occurrence was 30 minutes ago.  She denies command hallucinations or visual hallucinations.   She reports [name] will be mad at her if sees her sitting  She states he husband gets aggravated and yells at her but she denies physical abuse. She states   Per ED Provider Admission Assessment 10/20/2023@1734  HPI   LAPORSHIA NORMOYLE is a 58 y.o. female with a history of hypertension, HFpEF, diabetes, CKD stage III, and GERD who presents with multiple complaints which are chronic but worsening.  The patient's husband is the primary historian.  He states that she has had poor sleep over some time, but it has worsened this week.  She was started on lorazepam  in addition to trazodone  for sleep, and has been sleeping better, but now has also been intermittently disoriented throughout the day.  She has had increased anxiety.  She also reports some abdominal discomfort and had reported chest pain in triage although denies any chest pain now.  She does report some difficulty breathing.  She has no vomiting or diarrhea.  She has a very strong appetite and often wants to eat every 15 minutes.  She has no fever.  She was seen by her primary care provider and instructed to come to the ED for further evaluation.   I reviewed the past medical records.  The patient was seen in the ED on 4/24 with abdominal and pain.  Workup was negative at that time.  Previously she was seen in December  also for abdominal pain with a negative workup.  She was last admitted to the hospital service in August of last year for chest pain with minimally elevated troponins.    Psych ROS:  Depression: past, denies present Anxiety:  yes Mania (lifetime and current): denies Psychosis: (lifetime and current): AVH-of hearing sounds  Collateral information:  Contacted patient's spouse, April Knack @[phone],AVW provides the following information: He has hx of stroke and is the primary caregiver for his wife.  He reports he has some limitations in caring for his wife but he does the best he can.  He states her 2 sons and her mother all live down the street and do not offer assistance.  He states he has church friends that occasionally visit but they cannot help with ADLs. He reports patient was combative, restless and hallucinating so her brought her to the hospital for evaluation. He reports his wife has a hx for dementia and has demonstrated progressive cognitive decline.  He reports he's interested  in getting her to a memory care unit but cannot afford it financially and is concerned her insurance will not cover it.  He reports she is followed by her primary care provider who previously prescribed trazodone  and lorazepam  for sleep/anxiety.  He reports the last Monday, patient was up for >24 hours and trazodone  150mg  did not work her her.  He gave her lorazepam  0.5mg  which allowed her to sleep for about 12 hours.    Review of Systems  Constitutional: Negative.   HENT: Negative.    Eyes: Negative.   Respiratory: Negative.    Cardiovascular: Negative.   Gastrointestinal: Negative.   Genitourinary: Negative.   Musculoskeletal: Negative.   Skin: Negative.   Neurological: Negative.   Endo/Heme/Allergies: Negative.   Psychiatric/Behavioral:  Positive for hallucinations and memory loss. The patient is nervous/anxious and has insomnia.      Psychiatric and Social History  Psychiatric History:   Information collected from patient, her spouse and chart review  Prev Dx/Sx: anxiety Current Psych Provider: denies Home Meds (current): as listed below Previous Med Trials: deferred Therapy: denies  Prior Psych Hospitalization: denies  Prior Self Harm: denies Prior Violence: denies  Family Psych History: deferred Family Hx suicide: deferred  Social History:  Developmental Hx: deferred Educational Hx: deferred Occupational Hx: deferred Legal Hx: deferred Living Situation: lives at home with her spouse Spiritual Hx: references God; Access to weapons/lethal means: denies   Substance History Alcohol: denies  Illicit drugs: denies Prescription drug abuse: denies Rehab hx: n/a  Exam Findings  Physical Exam:  Vital Signs:  Temp:  [98.6 F (37 C)-98.7 F (37.1 C)] 98.6 F (37 C) (05/16 1940) Pulse Rate:  [90-111] 111 (05/16 1940) Resp:  [14-18] 16 (05/16 1940) BP: (83-145)/(37-79) 124/79 (05/16 1940) SpO2:  [89 %-100 %] 100 % (05/16 1940) Blood pressure 124/79, pulse (!) 111, temperature 98.6 F (37 C), resp. rate 16, height 5' (1.524 m), weight 56.2 kg, SpO2 100%. Body mass index is 24.22 kg/m.  Physical Exam Cardiovascular:     Rate and Rhythm: Normal rate.  Musculoskeletal:        General: Normal range of motion.     Cervical back: Normal range of motion.  Neurological:     Mental Status: She is alert. Mental status is at baseline. She is disoriented.  Psychiatric:        Attention and Perception: She perceives auditory (reports seeing shadows, episodically) hallucinations.        Mood and Affect: Mood is anxious. Affect is labile and tearful.        Speech: Speech normal.        Behavior: Behavior is hyperactive.        Thought Content: Thought content does not include homicidal or suicidal ideation. Thought content does not include homicidal or suicidal plan.        Cognition and Memory: Cognition is impaired. Memory is impaired.        Judgment: Judgment  is impulsive.     Mental Status Exam: General Appearance: Fairly Groomed  Orientation:  Full (Time, Place, and Person)  Memory:  Immediate;   Fair Recent;   Fair Remote;   Fair  Concentration:  Concentration: Poor and Attention Span: Poor  Recall:  Poor  Attention  Fair  Eye Contact:  Fair  Speech:  Clear and Coherent  Language:  Good  Volume:  Decreased  Mood: Anxious  Affect:  {Affect (PAA):22687}  Thought Process:  {Thought Process (PAA):22688}  Thought Content:  {Thought  Content:22690}  Suicidal Thoughts:  {ST/HT (PAA):22692}  Homicidal Thoughts:  {ST/HT (PAA):22692}  Judgement:  {Judgement (PAA):22694}  Insight:  {Insight (PAA):22695}  Psychomotor Activity:  {Psychomotor (PAA):22696}  Akathisia:  {BHH YES OR NO:22294}  Fund of Knowledge:  {BHH GOOD/FAIR/POOR:22877}      Assets:  {Assets (PAA):22698}  Cognition:  {chl bhh cognition:304700322}  ADL's:  {BHH UEA'V:40981}  AIMS (if indicated):        Other History   These have been pulled in through the EMR, reviewed, and updated if appropriate.  Family History:  The patient's family history includes Breast cancer (age of onset: 21) in her maternal aunt.  Medical History: Past Medical History:  Diagnosis Date  . (HFpEF) heart failure with preserved ejection fraction (HCC)   . Anemia due to chronic kidney disease   . Chronic gastritis   . CKD (chronic kidney disease), stage III (HCC)   . Cognitive decline   . DM2 (diabetes mellitus, type 2) (HCC)   . Gastritis, Helicobacter pylori 2015  . GERD (gastroesophageal reflux disease)   . Wernicke-Korsakoff syndrome [facility])     Surgical History: Past Surgical History:  Procedure Laterality Date  . CESAREAN SECTION    . CHOLECYSTECTOMY    . DILATION AND CURETTAGE OF UTERUS    . ESOPHAGOGASTRODUODENOSCOPY N/A 07/18/2019   Procedure: ESOPHAGOGASTRODUODENOSCOPY (EGD);  Surgeon: Toledo, Alphonsus Jeans, MD;  Location: [facility] ENDOSCOPY;  Service: Gastroenterology;  Laterality:  N/A;     Medications:   Current Facility-Administered Medications:  .  acetaminophen  (TYLENOL ) tablet 500 mg, 500 mg, Oral, Q6H PRN, Zhang, Ping T, MD .  dicyclomine  (BENTYL ) capsule 10 mg, 10 mg, Oral, BID PRN, Zhang, Ping T, MD .  feeding supplement (ENSURE ENLIVE / ENSURE PLUS) liquid 237 mL, 237 mL, Oral, TID BM, Antoniette Batty T, MD, 237 mL at 10/21/23 2133 .  heparin injection 5,000 Units, 5,000 Units, Subcutaneous, Q8H, Antoniette Batty T, MD, 5,000 Units at 10/21/23 2131 .  insulin  aspart (novoLOG ) injection 0-9 Units, 0-9 Units, Subcutaneous, TID WC, Antoniette Batty T, MD .  melatonin tablet 2.5 mg, 2.5 mg, Oral, QHS, Zhang, Ping T, MD, 2.5 mg at 10/21/23 2132 .  OLANZapine (ZYPREXA) injection 2.5 mg, 2.5 mg, Intramuscular, Q6H PRN, Antoniette Batty T, MD, 2.5 mg at 10/21/23 2132 .  ondansetron  (ZOFRAN ) tablet 4 mg, 4 mg, Oral, Q6H PRN **OR** ondansetron  (ZOFRAN ) injection 4 mg, 4 mg, Intravenous, Q6H PRN, Antoniette Batty T, MD .  pantoprazole  (PROTONIX ) EC tablet 40 mg, 40 mg, Oral, Daily, Antoniette Batty T, MD, 40 mg at 10/21/23 1657 .  sodium bicarbonate tablet 650 mg, 650 mg, Oral, BID, Zhang, Ping T, MD, 650 mg at 10/21/23 2132 .  traZODone  (DESYREL ) tablet 100 mg, 100 mg, Oral, QHS, Zhang, Ping T, MD, 100 mg at 10/21/23 2132  Allergies: Allergies  Allergen Reactions  . Sulfa Antibiotics Hives  . Codeine Hives    [place] Deleo E Antwann Preziosi, NP

## 2023-10-21 NOTE — H&P (Signed)
 History and Physical    Deanna Joseph YNW:[id number] DOB: [birth year] DOA: 10/20/2023  PCP: Comer Decamp, MD (Confirm with patient/family/NH records and if not entered, this has to be entered at [facility] point of entry) Patient coming from: Home  I have personally briefly reviewed patient's old medical records in [facility]  Chief Complaint: Altered mentations  HPI: Deanna Joseph is a 58 y.o. female with medical history significant of CKD stage IIIb, HTN, chronic HFpEF, IIDM, anxiety/depression, brought in by family member for evaluation of altered mentations.  Patient is confused unable to provide any history, most of history provided by husband at bedside.  Husband reported that the patient has chronic insomnia and has been taking trazodone  at bedtime.  Trazodone  stopped working over the weekend, patient did not sleep Saturday on Sunday.  Monday patient was ordered Ativan , patient took Ativan  for the first time Monday night and slept, also on Monday patient was ordered Macrobid  to treat her UTI.  Woke up Tuesday okay but despite taking Ativan  Tuesday and Wednesday, patient could not achieve same effect and did not sleep for more than 48 hours since Wednesday so far.  Meantime patient became altered and has episodes of agitation and confusion.  Patient also complaining about feeling lightheadedness but no fall.  Denies any pain, no dysuria no diarrhea.  ED Course: Afebrile, not tachycardia blood pressure 130/70 O2 saturation 100% on room air.  Blood work showed BUN 32 creatinine 2.0 compared to baseline 1.9-2.0 bicarb 16, WBC 6.1 hemoglobin 11.8.  CT head negative for acute findings.  Patient was also found to be hypoglycemic on arrival glucose in the 70s dropped to 40s and after given multiple candies and carbs glucose recovered to 90s.  Patient was given multiple dosage of Ativan  and haloperidol  in the ED  Review of Systems: Unable to perform, patient is sedated versus confused.  CC  so  Past Medical History:  Diagnosis Date   (HFpEF) heart failure with preserved ejection fraction (HCC)    Anemia due to chronic kidney disease    Chronic gastritis    CKD (chronic kidney disease), stage III (HCC)    Cognitive decline    DM2 (diabetes mellitus, type 2) (HCC)    Gastritis, Helicobacter pylori 2015   GERD (gastroesophageal reflux disease)    Wernicke-Korsakoff syndrome (HCC)     Past Surgical History:  Procedure Laterality Date   CESAREAN SECTION     CHOLECYSTECTOMY     DILATION AND CURETTAGE OF UTERUS     ESOPHAGOGASTRODUODENOSCOPY N/A 07/18/2019   Procedure: ESOPHAGOGASTRODUODENOSCOPY (EGD);  Surgeon: Toledo, Alphonsus Jeans, MD;  Location: [facility] ENDOSCOPY;  Service: Gastroenterology;  Laterality: N/A;     reports that she has never smoked. She has never used smokeless tobacco. She reports that she does not currently use alcohol. No history on file for drug use.  Allergies  Allergen Reactions   Sulfa Antibiotics Hives   Codeine Hives    Family History  Problem Relation Age of Onset   Breast cancer Maternal Aunt 55     Prior to Admission medications   Medication Sig Start Date End Date Taking? Authorizing Provider  acetaminophen  (TYLENOL ) 500 MG tablet Take 500 mg by mouth every 6 (six) hours as needed.   Yes [provider]  dicyclomine  (BENTYL ) 10 MG capsule Take 1 capsule (10 mg total) by mouth 2 (two) times daily as needed for spasms. 09/29/23  Yes Kandee Orion, MD  LORazepam  (ATIVAN ) 0.5 MG tablet Take 0.5  mg by mouth at bedtime. 10/18/23  Yes [provider]  nitrofurantoin , macrocrystal-monohydrate, (MACROBID ) 100 MG capsule Take 100 mg by mouth 2 (two) times daily. 10/18/23  Yes [provider]  omeprazole (PRILOSEC) 40 MG capsule Take 40 mg by mouth daily as needed.   Yes [provider]  traZODone  (DESYREL ) 100 MG tablet Take 100 mg by mouth at bedtime. 09/21/23  Yes [provider]  dicyclomine  (BENTYL ) 20 MG  tablet Take 20 mg by mouth 4 (four) times daily as needed (stomach pains). For 7 days 10/12/23   [provider]  feeding supplement, ENSURE ENLIVE, (ENSURE ENLIVE) LIQD Take 237 mLs by mouth 3 (three) times daily between meals. 07/30/19   Garrison Kanner, MD  furosemide  (LASIX ) 40 MG tablet Take 1 tablet (40 mg total) by mouth 2 (two) times daily. Patient not taking: Reported on 10/21/2023 01/18/23 03/19/23  Tiajuana Fluke, MD  metoprolol  succinate (TOPROL -XL) 25 MG 24 hr tablet Take 1 tablet (25 mg total) by mouth daily. Patient not taking: Reported on 10/21/2023 01/18/23   Tiajuana Fluke, MD  spironolactone  (ALDACTONE ) 25 MG tablet Take 1 tablet (25 mg total) by mouth daily. Patient not taking: Reported on 10/21/2023 01/26/23 04/26/23  Charlette Console, FNP  traMADol  (ULTRAM ) 50 MG tablet Take 50 mg by mouth every 6 (six) hours as needed for moderate pain. Patient not taking: Reported on 10/21/2023 12/16/22   [provider]  traZODone  (DESYREL ) 50 MG tablet Take 100 mg by mouth at bedtime. Patient not taking: Reported on 10/21/2023    [provider]    Physical Exam: Vitals:   10/20/23 2030 10/20/23 2315 10/20/23 2330 10/21/23 0933  BP: (!) 114/95 (!) 114/92 135/75 (!) 145/65  Pulse: 92 88 91 (!) 107  Resp: 19 18 14 16   Temp:      TempSrc:      SpO2: 100% 100% 100% 100%  Weight:      Height:        Constitutional: NAD, calm, comfortable Vitals:   10/20/23 2030 10/20/23 2315 10/20/23 2330 10/21/23 0933  BP: (!) 114/95 (!) 114/92 135/75 (!) 145/65  Pulse: 92 88 91 (!) 107  Resp: 19 18 14 16   Temp:      TempSrc:      SpO2: 100% 100% 100% 100%  Weight:      Height:       Eyes: PERRL, lids and conjunctivae normal ENMT: Mucous membranes are moist. Posterior pharynx clear of any exudate or lesions.Normal dentition.  Neck: normal, supple, no masses, no thyromegaly Respiratory: clear to auscultation bilaterally, no wheezing, no crackles. Normal respiratory  effort. No accessory muscle use.  Cardiovascular: Regular rate and rhythm, no murmurs / rubs / gallops. No extremity edema. 2+ pedal pulses. No carotid bruits.  Abdomen: no tenderness, no masses palpated. No hepatosplenomegaly. Bowel sounds positive.  Musculoskeletal: no clubbing / cyanosis. No joint deformity upper and lower extremities. Good ROM, no contractures. Normal muscle tone.  Skin: no rashes, lesions, ulcers. No induration Neurologic: CN 2-12 grossly intact. Sensation intact, DTR normal. Strength 5/5 in all 4.  Psychiatric: Wake, oriented to herself, confused about time and place   Labs on Admission: I have personally reviewed following labs and imaging studies  CBC: Recent Labs  Lab 10/20/23 1624  WBC 6.1  HGB 11.8*  HCT 35.2*  MCV 89.3  PLT 261   Basic Metabolic Panel: Recent Labs  Lab 10/20/23 1624  NA 135  K 4.1  CL  100  CO2 16*  GLUCOSE 70  BUN 32*  CREATININE 2.01*  CALCIUM  9.1   GFR: Estimated Creatinine Clearance: 24.3 mL/min (A) (by C-G formula based on SCr of 2.01 mg/dL (H)). Liver Function Tests: Recent Labs  Lab 10/20/23 1624  AST 30  ALT 20  ALKPHOS 59  BILITOT 1.3*  PROT 7.6  ALBUMIN 4.3   No results for input(s): "LIPASE", "AMYLASE" in the last 168 hours. No results for input(s): "AMMONIA" in the last 168 hours. Coagulation Profile: No results for input(s): "INR", "PROTIME" in the last 168 hours. Cardiac Enzymes: No results for input(s): "CKTOTAL", "CKMB", "CKMBINDEX", "TROPONINI" in the last 168 hours. BNP (last 3 results) No results for input(s): "PROBNP" in the last 8760 hours. HbA1C: No results for input(s): "HGBA1C" in the last 72 hours. CBG: Recent Labs  Lab 10/21/23 0931 10/21/23 0952 10/21/23 1010 10/21/23 1450  GLUCAP 46* 59* 85 86   Lipid Profile: No results for input(s): "CHOL", "HDL", "LDLCALC", "TRIG", "CHOLHDL", "LDLDIRECT" in the last 72 hours. Thyroid Function Tests: Recent Labs    10/20/23 1624  TSH  1.733   Anemia Panel: No results for input(s): "VITAMINB12", "FOLATE", "FERRITIN", "TIBC", "IRON", "RETICCTPCT" in the last 72 hours. Urine analysis:    Component Value Date/Time   COLORURINE YELLOW (A) 10/20/2023 1950   APPEARANCEUR HAZY (A) 10/20/2023 1950   LABSPEC 1.011 10/20/2023 1950   PHURINE 5.0 10/20/2023 1950   GLUCOSEU NEGATIVE 10/20/2023 1950   HGBUR MODERATE (A) 10/20/2023 1950   BILIRUBINUR NEGATIVE 10/20/2023 1950   KETONESUR 20 (A) 10/20/2023 1950   PROTEINUR 100 (A) 10/20/2023 1950   NITRITE NEGATIVE 10/20/2023 1950   LEUKOCYTESUR NEGATIVE 10/20/2023 1950    Radiological Exams on Admission: MR Brain W and Wo Contrast Result Date: 10/20/2023 CLINICAL DATA:  Altered mental status EXAM: MRI HEAD WITHOUT AND WITH CONTRAST TECHNIQUE: Multiplanar, multiecho pulse sequences of the brain and surrounding structures were obtained without and with intravenous contrast. CONTRAST:  5mL GADAVIST  GADOBUTROL  1 MMOL/ML IV SOLN COMPARISON:  09/30/2022 FINDINGS: Brain: No acute infarct, mass effect or extra-axial collection. No acute or chronic hemorrhage. There is multifocal hyperintense T2-weighted signal within the white matter. Age-advanced volume loss. 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 Dilated temporal horns of the lateral ventricles with anterior temporal predominant volume loss. The midline structures are normal. Vascular: Normal flow voids. Skull and upper cervical spine: Normal calvarium and skull base. Visualized upper cervical spine and soft tissues are normal. Sinuses/Orbits:No paranasal sinus fluid levels or advanced mucosal thickening. No mastoid or middle ear effusion. Normal orbits. IMPRESSION: 1. No acute intracranial abnormality. 2. Dilated temporal horns of the lateral ventricles with anterior temporal predominant volume loss. 3. 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 Electronically Signed   By: Juanetta Nordmann M.D.   On:  10/20/2023 22:55   CT Head Wo Contrast Result Date: 10/20/2023 CLINICAL DATA:  Mental status change, unknown cause EXAM: CT HEAD WITHOUT CONTRAST TECHNIQUE: Contiguous axial images were obtained from the base of the skull through the vertex without intravenous contrast. RADIATION DOSE REDUCTION: This exam was performed according to the departmental dose-optimization program which includes automated exposure control, adjustment of the mA and/or kV according to patient size and/or use of iterative reconstruction technique. COMPARISON:  01/11/2023 FINDINGS: Brain: Old left thalamic lacunar infarct. No acute intracranial abnormality. Specifically, no hemorrhage, hydrocephalus, mass lesion, acute infarction, or significant intracranial injury. Vascular: No hyperdense vessel or unexpected calcification. Skull: No acute calvarial abnormality. Sinuses/Orbits:  No acute findings Other: None IMPRESSION: No acute intracranial abnormality. Electronically Signed   By: Janeece Mechanic M.D.   On: 10/20/2023 19:09   DG Chest 2 View Result Date: 10/20/2023 CLINICAL DATA:  Chest pain. EXAM: CHEST - 2 VIEW COMPARISON:  09/29/2023. FINDINGS: Low lung volumes. The heart size and mediastinal contours are within normal limits. Both lungs are clear. No pleural effusion or pneumothorax. No acute osseous abnormality. IMPRESSION: No active cardiopulmonary disease. Electronically Signed   By: Mannie Seek M.D.   On: 10/20/2023 17:18    EKG: Independently reviewed.  Sinus tachycardia, no acute ST changes.  Assessment/Plan Principal Problem:   Encephalopathy Active Problems:   AMS (altered mental status)  (please populate well all problems here in Problem List. (For example, if patient is on BP meds at home and you resume or decide to hold them, it is a problem that needs to be her. Same for CAD, COPD, HLD and so on)  Acute metabolic encephalopathy - Appears to be multifactorial from a combined effect of polypharmacy,  hypoglycemia, disturbed sleep cycle. - Educated family regarding the safety of Ativan , especially given patient history of CKD stage IIIb.  Will try to avoid daytime use of Ativan .  For sleep cycle disturbance, encourage patient to use more natural medication such as melatonin than benzos.  Family however at this point is not very convinced.  I also recommend patient to go to see sleep medicine and also discussed with patient regarding sleep hygiene. - Another main contributing factor likely is Macrobid , given her kidney function is rather contraindicated. - Hypoglycemia likely secondary to poor oral intake, as patient is awake, encouraged her to increase p.o. intake.  IIDM with hypoglycemia - A1c 6.7 last year - On diet control, hypoglycemia likely secondary to poor oral intake.  Encourage patient to increase calorie intake.  CKD stage IIIb with chronic proteinuria - Gradually worsening of kidney function since last year with persistent proteinuria - Check urine protein/creatinine ratio and outpatient follow-up with nephrology - Blood pressure borderline low, hold off home BP meds today  DVT prophylaxis: Heparin subcu Code Status: Full code Family Communication: Husband at bedside Disposition Plan: Expect less than 2 midnight hospital stay Consults called: None Admission status: MedSurg observation   [place] MD [facility] Pager [phone]  10/21/2023, 3:18 PM

## 2023-10-21 NOTE — BH Assessment (Signed)
 Pt is unable to awaken for the assessment.  TTS will follow up with assessment when pt is more alert.

## 2023-10-21 NOTE — TOC Initial Note (Signed)
 Transition of Care [facility]) - Initial/Assessment Note    Patient Details  Name: Deanna Joseph MRN: [id number] Date of Birth: 1966/05/08  [facility]) CM/SW Contact:    Elmira Haddock, LCSW Phone Number: 10/21/2023, 12:50 PM  Clinical Narrative:                 CSW met with patient at bedside to complete TOC assessment.  CSW introduced self and reason for visit. Patient states that she "thinks her PCP is Dr. Lydia Sams and CSW confirmed that that is correct,  She reports that she gets her meds from [facility].  She lives with her husband, April Knack [phone]).  Patient states that she has the following DME:  WC and walker.  She reports that she's "somewhat" able to complete ADL's.  Her husband will transport her home when she is medically ready.    CSW discussed recommendations of HHPT/OT and patient asked that CSW speak with her husband about that.  CSW phoned husband and lvm for a return call.    During assessment, patient continuously asked CSW if she would "die" if she sat still.  Per Epic chat with Dr. Arline Bennett, he thinks that Hospitalist is making a determination about admitting patient.    TOC to continue to follow for discharge planning.    Barriers to Discharge: Continued Medical Work up   Patient Goals and CMS Choice            Expected Discharge Plan and Services       Living arrangements for the past 2 months: Single Family Home                                      Prior Living Arrangements/Services Living arrangements for the past 2 months: Single Family Home Lives with:: Spouse                   Activities of Daily Living      Permission Sought/Granted                  Emotional Assessment           Psych Involvement:  (Waiting on assessment)  Admission diagnosis:  CP Patient Active Problem List   Diagnosis Date Noted   Chest pain 01/17/2023   Uncontrolled type 2 diabetes mellitus with  hyperglycemia, without long-term current use of insulin  (HCC) 01/17/2023   Stage 3a chronic kidney disease (CKD) (HCC) 01/17/2023   GERD without esophagitis 01/17/2023   Acute hyponatremia 01/11/2023   Acute metabolic encephalopathy 01/11/2023   Bilateral lower extremity edema 01/11/2023   Acute on chronic diastolic CHF (congestive heart failure) (HCC) 01/11/2023   Cognitive deficits 01/11/2023   Contusion of right hip 10/01/2022   Syncope 09/30/2022   UTI (urinary tract infection) 09/30/2022   Humerus fracture 09/30/2022   Wernicke-Korsakoff syndrome, nonalcoholic (HCC) 08/07/2019   Protein-calorie malnutrition, severe 07/24/2019   Nausea    Ptosis, left eyelid    Acute gastritis without hemorrhage    Weakness    Type 2 diabetes mellitus with hyperlipidemia (HCC)    Depression    Hypomagnesemia    Gastroparesis 07/18/2019   Intractable vomiting with nausea 07/17/2019   Unintentional weight loss of more than 10 pounds in 90 days 07/17/2019   History of chronic gastritis 07/17/2019   History of Helicobacter pylori infection 07/17/2019   Diet-controlled  diabetes mellitus (HCC) 07/17/2019   AKI (acute kidney injury) (HCC) 07/17/2019   Metabolic acidosis 07/17/2019   Hypokalemia 07/17/2019   Hypotension 07/17/2019   PCP:  Comer Decamp, MD Pharmacy:   [facility] - [place], [place] - [address] [place] [address] Phone: [phone] Fax: [phone]  [facility] [address] ([address]), [place] - [address] [place]) [place] [address] Phone: [phone] Fax: [phone]     Social Drivers of Health (SDOH) Social History: SDOH Screenings   Food Insecurity: No Food Insecurity (01/17/2023)  Housing: Low Risk  (01/17/2023)  Transportation Needs: No Transportation Needs (01/17/2023)  Utilities: Not At Risk (01/17/2023)  Financial Resource Strain: Low Risk  (02/26/2020)   Received from  [facility]  Recent Concern: Financial Resource Strain - Medium Risk (01/13/2020)   Received from [facility]  Tobacco Use: Low Risk  (10/21/2023)   SDOH Interventions:     Readmission Risk Interventions     No data to display

## 2023-10-21 NOTE — Evaluation (Signed)
 Physical Therapy Evaluation Patient Details Name: Deanna Joseph MRN: [id number] DOB: Jun 12, 1965 Today's Date: 10/21/2023  History of Present Illness  Pt is a 58 y.o. female presenting to hospital 10/20/23 with c/o chest pain.  Per ED MD note (info via pt's husband): "He states that she has had poor sleep over some time, but it has worsened this week.  She was started on lorazepam  in addition to trazodone  for sleep, and has been sleeping better, but now has also been intermittently disoriented throughout the day.  She has had increased anxiety.  She also reports some abdominal discomfort and had reported chest pain in triage although denies any chest pain now.  She does report some difficulty breathing.  She has no vomiting or diarrhea.  She has a very strong appetite and often wants to eat every 15 minutes.  She has no fever.  She was seen by her primary care provider and instructed to come to the ED for further evaluation". PMH includes HFpEF, stage III CKD, DM, H. Pylori, Wernicke-Korsakoff syndrome, CKD stage III.  Clinical Impression  Prior to recent medical concerns, pt reports being modified independent with ambulation using cane; lives with her husband.  Pt oriented to name, DOB, and hospital only; pt appearing confused and anxious during session.  Currently pt is CGA with sit to/from stand transfer and ambulation (improved gait quality noted with RW use compared to [facility] use).  Pt would currently benefit from skilled PT to address noted impairments and functional limitations (see below for any additional details).  Upon hospital discharge, pt would benefit from ongoing therapy.     If plan is discharge home, recommend the following: A little help with walking and/or transfers;A little help with bathing/dressing/bathroom;Assistance with cooking/housework;Direct supervision/assist for medications management;Direct supervision/assist for financial management;Assist for transportation;Help with stairs  or ramp for entrance;Supervision due to cognitive status   Can travel by private vehicle        Equipment Recommendations Rolling walker (2 wheels)  Recommendations for Other Services       Functional Status Assessment Patient has had a recent decline in their functional status and demonstrates the ability to make significant improvements in function in a reasonable and predictable amount of time.     Precautions / Restrictions Precautions Precautions: Fall Recall of Precautions/Restrictions: Impaired Restrictions Weight Bearing Restrictions Per Provider Order: No      Mobility  Bed Mobility Overal bed mobility: Needs Assistance Bed Mobility: Supine to Sit, Sit to Supine     Supine to sit: Min assist, HOB elevated (assist for trunk) Sit to supine: Contact guard assist, HOB elevated        Transfers Overall transfer level: Needs assistance Equipment used: Rolling walker (2 wheels) Transfers: Sit to/from Stand Sit to Stand: Contact guard assist           General transfer comment: steady transfer from ED stretcher bed    Ambulation/Gait Ambulation/Gait assistance: Contact guard assist Gait Distance (Feet):  (10 feet with SPC; 50 feet with RW) Assistive device: Straight cane, Rolling walker (2 wheels)   Gait velocity: decreased     General Gait Details: pt initially with short shuffling steps and appearing mildly unsteady with SPC use so switched to RW use; pt initially walking slowly with RW with short shuffling steps but was steady; with cueing to increase gait speed improved partial step through gait pattern noted with improved gait velocity  Careers information officer  Tilt Bed    Modified Rankin (Stroke Patients Only)       Balance Overall balance assessment: Needs assistance Sitting-balance support: No upper extremity supported, Feet supported Sitting balance-Leahy Scale: Good Sitting balance - Comments: steady reaching  within BOS   Standing balance support: Bilateral upper extremity supported, During functional activity, Reliant on assistive device for balance Standing balance-Leahy Scale: Good Standing balance comment: steady ambulating with RW use                             Pertinent Vitals/Pain Pain Assessment Pain Assessment: No/denies pain HR 90-114 bpm during sessions activities.    Home Living Family/patient expects to be discharged to:: Private residence Living Arrangements: Spouse/significant other Available Help at Discharge: Family;Available PRN/intermittently Type of Home: House Home Access: Stairs to enter   [employer] of Steps: pt reports level entry   Home Layout: One level Home Equipment: Cane - single Personnel officer (2 wheels);Shower seat;BSC/3in1 Additional Comments: Pt appearing to be questionable historian; family not present to verify    Prior Function Prior Level of Function : Patient poor historian/Family not available;Needs assist             Mobility Comments: Pt uses SPC in home but transport chair out of home; pt denies any recent falls ADLs Comments: Per OT pt was "indep with toileting and usually with dressing but if not feeling well spouse assists PRN with dressing, assist for bathing, spouse does majority of meal prep and cleaning; assist for med mgt and transportation"     Extremity/Trunk Assessment   Upper Extremity Assessment Upper Extremity Assessment: Generalized weakness    Lower Extremity Assessment Lower Extremity Assessment: Generalized weakness    Cervical / Trunk Assessment Cervical / Trunk Assessment: Normal  Communication   Communication Communication: No apparent difficulties    Cognition Arousal: Alert Behavior During Therapy: Anxious   PT - Cognitive impairments: No family/caregiver present to determine baseline                       PT - Cognition Comments: Pt oriented  to name, DOB, and hospital only; pt appearing confused in general and anxious Following commands: Impaired Following commands impaired: Only follows one step commands consistently, Follows one step commands with increased time     Cueing Cueing Techniques: Verbal cues     General Comments  Nursing cleared pt for participation in physical therapy.  Pt agreeable to PT session.    Exercises     Assessment/Plan    PT Assessment Patient needs continued PT services  PT Problem List Decreased strength;Decreased activity tolerance;Decreased balance;Decreased mobility;Decreased cognition;Decreased knowledge of use of DME;Decreased safety awareness;Decreased knowledge of precautions       PT Treatment Interventions DME instruction;Gait training;Stair training;Functional mobility training;Therapeutic activities;Therapeutic exercise;Balance training;Patient/family education    PT Goals (Current goals can be found in the Care Plan section)  Acute Rehab PT Goals Patient Stated Goal: to improve overall strength PT Goal Formulation: With patient Time For Goal Achievement: 11/04/23 Potential to Achieve Goals: Fair    Frequency Min 2X/week     Co-evaluation               AM-PAC PT "6 Clicks" Mobility  Outcome Measure Help needed turning from your back to your side while in a flat bed without using bedrails?: None Help needed moving from lying on your back to sitting on the side of  a flat bed without using bedrails?: A Little Help needed moving to and from a bed to a chair (including a wheelchair)?: A Little Help needed standing up from a chair using your arms (e.g., wheelchair or bedside chair)?: A Little Help needed to walk in hospital room?: A Little Help needed climbing 3-5 steps with a railing? : A Little 6 Click Score: 19    End of Session Equipment Utilized During Treatment: Gait belt Activity Tolerance: Patient tolerated treatment well Patient left: in bed;with call  bell/phone within reach;with bed alarm set;Other (comment) (ED bed in lowest position with B railings up) Nurse Communication: Mobility status;Precautions PT Visit Diagnosis: Unsteadiness on feet (R26.81);Other abnormalities of gait and mobility (R26.89);Muscle weakness (generalized) (M62.81)    Time: 3016-0109 PT Time Calculation (min) (ACUTE ONLY): 22 min   Charges:   PT Evaluation $PT Eval Low Complexity: 1 Low PT Treatments $Gait Training: 8-22 mins PT General Charges $$ ACUTE PT VISIT: 1 Visit        Amador Junes, PT 10/21/23, 12:23 PM

## 2023-10-21 NOTE — ED Notes (Signed)
 Husband at bedside.

## 2023-10-21 NOTE — BH Assessment (Addendum)
 Comprehensive Clinical Assessment (CCA) Screening, Triage and Referral Note  10/21/2023 Deanna Joseph [id number]  Chief Complaint:  Chief Complaint  Patient presents with   Chest Pain   Visit Diagnosis: Altered mental state  Deanna Joseph is a 58 year old female who presents to the ER via her husband due to concerns about her current mental state and behaviors. During the interview, the patient was confused and fixated on whether or not she should be standing or sitting.  Per the report of the patient's husband Deanna Joseph [phone]), she has had similar episodes but none as severe as they currently are. Starting Sunday night, she hadn't slept in over twenty-four hours. Her trazodone  would normally help her sleep but it didn't work. She was taking to her PCP and giving medications for a UTI. There was no improvement in her behaviors and mental state. She was brought to the ER and giving medications to help her sleep. However, it wasn't sustained. She stopped sleeping again, she became fixated on whether or not she was going to die soon, she's had shortness of breath and rocking back and forwarded repeating herself.  Patient Reported Information How did you hear about us ? Family/Friend  What Is the Reason for Your Visit/Call Today? Brought to the ER via her husband due to concerns about the changes in her mental state and behaviors.  How Long Has This Been Causing You Problems? 1 wk - 1 month  What Do You Feel Would Help You the Most Today? Treatment for Depression or other mood problem   Have You Recently Had Any Thoughts About Hurting Yourself? No  Are You Planning to Commit Suicide/Harm Yourself At This time? No   Have you Recently Had Thoughts About Hurting Someone Deanna Joseph? No  Are You Planning to Harm Someone at This Time? No  Explanation: No data recorded  Have You Used Any Alcohol or Drugs in the Past 24 Hours? No  How Long Ago Did You Use Drugs or Alcohol? No data  recorded What Did You Use and How Much? No data recorded  Do You Currently Have a Therapist/Psychiatrist? No  Name of Therapist/Psychiatrist: No data recorded  Have You Been Recently Discharged From Any Office Practice or Programs? No  Explanation of Discharge From Practice/Program: No data recorded   CCA Screening Triage Referral Assessment Type of Contact: Face-to-Face  Telemedicine Service Delivery:   Is this Initial or Reassessment?   Date Telepsych consult ordered in [facility]:    Time Telepsych consult ordered in [facility]:    [facility]  Provider Location: [facility]    Collateral Involvement: No data recorded  Does Patient Have a Court Appointed Legal Guardian? No data recorded Name and Contact of Legal Guardian: No data recorded If Minor and Not Living with Parent(s), Who has Custody? No data recorded Is CPS involved or ever been involved? Never  Is APS involved or ever been involved? Never   Patient Determined To Be At Risk for Harm To Self or Others Based on Review of Patient Reported Information or Presenting Complaint? No  Method: No data recorded Availability of Means: No data recorded Intent: No data recorded Notification Required: No data recorded Additional Information for Danger to Others Potential: No data recorded Additional Comments for Danger to Others Potential: No data recorded Are There Guns or Other Weapons in Your Home? No data recorded Types of Guns/Weapons: No data recorded Are These Weapons Safely Secured?  No data recorded Who Could Verify You Are Able To Have These Secured: No data recorded Do You Have any Outstanding Charges, Pending Court Dates, Parole/Probation? No data recorded Contacted To Inform of Risk of Harm To Self or Others: No data recorded  Does Patient Present under Involuntary Commitment? No  [place] of Residence: [place]  Patient Currently Receiving the Following Services: Medication  Management   Determination of Need: Emergent (2 hours)   Options For Referral: ED Visit   Disposition Recommendation per psychiatric provider: Patient to be admitted medical floor.  Bryce Captain MS, LCAS, [facility], [facility] Therapeutic Triage Specialist 10/21/2023 5:05 PM

## 2023-10-21 NOTE — BH Assessment (Signed)
 Writer called and left a HIPPA Compliant message with patient's husband [name] [phone]), requesting a return phone call.

## 2023-10-21 NOTE — Consult Note (Signed)
 [facility] Psychiatric Consult Initial  Patient Name: .Deanna Joseph  MRN: [id number]  DOB: 1966/03/23  Consult Order details:  Orders (From admission, onward)     Start     Ordered   10/20/23 2049  IP CONSULT TO PSYCHIATRY       Ordering Provider: Lind Repine, MD  Provider:  (Not yet assigned)  Question Answer Comment  Place call to: Psych provider   Reason for Consult Consult      10/20/23 2048   10/20/23 2049  CONSULT TO CALL ACT TEAM       Ordering Provider: Lind Repine, MD  Provider:  (Not yet assigned)  Question:  Reason for Consult?  Answer:  Psych consult   10/20/23 2048             Mode of Visit: Tele-visit Virtual Statement:TELE PSYCHIATRY ATTESTATION & CONSENT As the provider for this telehealth consult, I attest that I verified the patient's identity using two separate identifiers, introduced myself to the patient, provided my credentials, disclosed my location, and performed this encounter via a HIPAA-compliant, real-time, face-to-face, two-way, interactive audio and video platform and with the full consent and agreement of the patient (or guardian as applicable.) Patient physical location: [facility] ED. Telehealth provider physical location: home office in state of [place].   Video start time: 1040 Video end time: 1106    Psychiatry Consult Evaluation  Service Date: Oct 21, 2023 LOS:  LOS: 0 days  Chief Complaint "  Primary Psychiatric Diagnoses  Altered Mental Status 2.  Hallucinations 3.  Insomnia  Assessment  Deanna Joseph is a 58 y.o. female admitted: Presented to the EDfor 10/20/2023  5:30 PM for evaluation of CP, hallucinations, insomnia and chronic medication issues. She carries the psychiatric diagnoses of Depression, Anxiety and has a past medical history of  HTN, GERD, acute on chronic CHF, type 2 DM, Wernicke-Korsakoff syndrome, non-alcoholic, stage 3a chronic kidney disease.   Patient initially presented to the emergency department for  evaluation of chest pain with differential diagnosis of UTI versus other primary CNS etiology or primary psychiatric concerns.    She does not have SI/HI but does endorses episodic AH---not command in nature. She reports new onset of hearing sounds but she's unable to elaborate.  She's also unable to provide a timeframe of when her symptoms started, which is not unusual considering her hx for cognitive impairment.  Her husband provided historical information on admission but was unavailable at the time of psych assessment. She has a hx for anxiety,  wernicke-korsafoff syndrome (non-alcoholic), impaired sleep, chronic kidney disease, unmanaged diabetes and is currently taking abx for urinary tract infection.  She has multiple medical co-morbidities  of which could contribute to her hallucinations.   She's notably anxious during assessment, observed fidgeting and having difficulty sitting still.   Additionally, she's currently being treated for UTI and her current U/A does not reflect active infection. Considering above, her concerns appear chronic and cannot see where they can be improved by psychiatric admission. Since her husband's primary concern was insomnia and hallucinations, recommend the following medication changes as listed below: Stop trazodone ;  -Start seroquel 12.5mg  po at bedtime for sleep, hallucinations;   -Continue Lorazepam  0.5mg  po BID prn severe anxiety. Benzodiazepines are not the best option but considering patient was already prescribed lorazepam , will defer changes to her primary prescriber.  -Recommend OP follow up with neurology for cognitive concerns.  -Therapy referral is deferred as her cognitive decline may interfere with her ability  to participate.  -Referral to [facility] for memory care information/resources  Diagnoses:  Active Hospital problems: Principal Problem:   AMS (altered mental status) Active Problems:   Insomnia    Plan   ## Psychiatric Medication  Recommendations:  -Recommend she stop trazodone ;  -Start seroquel 12.5mg  po at bedtime for sleep -Continue Lorazepam  0.5mg  po BID prn severe anxiety. Benzodiazepines are not the best option but considering patient was already prescribed lorazepam , will defer changes to her primary prescriber.   Non-medication recommendations: -Enter TOC referral for assistance with memory care unit  ## Medical Decision Making Capacity: Not specifically addressed in this encounter; that has hx for cognitive impairment, her husband is her primary caregiver and makes decisions for her.  No legal guardian paperwork on file.   ## Further Work-up:  -- Deferred to ED Provider While pt on Qtc prolonging medications, please monitor & replete K+ to 4 and Mg2+ to 2 -- most recent EKG on 10/20/2023 had QtC of 416 -- Pertinent labwork reviewed earlier this admission includes:  CMP, CBC,    ## Disposition:-- There are no psychiatric contraindications to discharge at this time  ## Behavioral / Environmental: -Utilize compassion and acknowledge the patient's experiences while setting clear and realistic expectations for care.    ## Safety and Observation Level:  - Based on my clinical evaluation, I estimate the patient to be at low risk of self harm in the current setting. - At this time, we recommend  routine. This decision is based on my review of the chart including patient's history and current presentation, interview of the patient, mental status examination, and consideration of suicide risk including evaluating suicidal ideation, plan, intent, suicidal or self-harm behaviors, risk factors, and protective factors. This judgment is based on our ability to directly address suicide risk, implement suicide prevention strategies, and develop a safety plan while the patient is in the clinical setting. Please contact our team if there is a concern that risk level has changed.  CSSR Risk Category:C-SSRS RISK CATEGORY: No  Risk  Suicide Risk Assessment: Patient has following modifiable risk factors for suicide: dx hx for cognitive impairment, sleep concerns which we are addressing by recommending seroquel for sleep and hallucinations; entering [facility] consult for memory care unit information. Patient has following non-modifiable or demographic risk factors for suicide: hx for cognitive decline Patient has the following protective factors against suicide: Supportive family, no history of suicide attempts, and no history of NSSIB  Thank you for this consult request. Recommendations have been communicated to the primary team.  We will sign off at this time.   Doneen Fuelling, NP       History of Present Illness  Relevant Aspects of Hospital ED Course:  Admitted on 10/20/2023 for CP, hallucinations.  She initially presented to the emergency with c/o chest pain was found to have underlying mental health concerns that was thought to contribute to her presentation.   Per RN Triage note dated 10/20/2023@1619 : Patient to ED via POV for generalized CP. Started Sunday. Per husband patient has had AMS during this time with hallucinations. Reports that she is hearing things and at times rocks back and forth.   Patient Report:  Patient is observed in private exam room, laying in bed, hob elevated. She is alert and oriented to person, location and concerns that led to current admission.  Patient greeted and given anticipatory guidance, she agrees to continue.  She is fairly groomed and dressed in hospital scrubs.  She appears anxious  and fidgety but in no apparent distress.  When asked about events that led to current hospitalization, she digresses to unrelated content.  Patient states,"You know what, my son's girlfriend's name is the same as mine."  She reports she came to the hospital for her health issues.  She rpeorts she eats and then throws up; she is unsure of her last meal.  She reports being anxious but is unsure why and  cannot name provocative factors.  She states she is not sure what mental health diagnosis she has.  She reports sleep as, "pretty good." She denies alcohol, marijuana or illicit drug usage. She states, "I'm note sure" to most questions, becomes upset and is tearful during the assessment.  Verbal support and encouragement extended.   She reports she does not want to hurt herself or anyone else because she does not want to go to hell.  She denies hx of NSSIB or prior suicide attempts.  She reports hearing sounds, that she cannot elaborate on, last occurrence was 30 minutes ago.  She denies command hallucinations or visual hallucinations.   Per ED Provider Admission Assessment 10/20/2023@1734  HPI   CATHLIN BUCHAN is a 58 y.o. female with a history of hypertension, HFpEF, diabetes, CKD stage III, and GERD who presents with multiple complaints which are chronic but worsening.  The patient's husband is the primary historian.  He states that she has had poor sleep over some time, but it has worsened this week.  She was started on lorazepam  in addition to trazodone  for sleep, and has been sleeping better, but now has also been intermittently disoriented throughout the day.  She has had increased anxiety.  She also reports some abdominal discomfort and had reported chest pain in triage although denies any chest pain now.  She does report some difficulty breathing.  She has no vomiting or diarrhea.  She has a very strong appetite and often wants to eat every 15 minutes.  She has no fever.  She was seen by her primary care provider and instructed to come to the ED for further evaluation.   I reviewed the past medical records.  The patient was seen in the ED on 4/24 with abdominal and pain.  Workup was negative at that time.  Previously she was seen in December also for abdominal pain with a negative workup.  She was last admitted to the hospital service in August of last year for chest pain with minimally elevated  troponins.    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  He reports he has some limitations in caring for his wife but he does the best he can.  He states her 2 sons and her mother all live down the street and do not offer assistance.  He states he has church friends that occasionally visit but they cannot help with ADLs. He reports patient was combative, restless and hallucinating so her brought her to the hospital for evaluation. He reports his wife has a hx for dementia and has demonstrated progressive cognitive decline.  He reports he's interested in getting her to a memory care unit but cannot afford it financially and is concerned her insurance will not cover it.  He reports she is followed by her primary care  provider who previously prescribed trazodone  and lorazepam  for sleep/anxiety.  He reports the last Monday, patient was up for >24 hours and trazodone  150mg  did not work her her.  He gave her lorazepam  0.5mg  which allowed her to sleep for about 12 hours.    Review of Systems  Constitutional: Negative.   HENT: Negative.    Eyes: Negative.   Respiratory: Negative.    Cardiovascular: Negative.   Gastrointestinal: Negative.   Genitourinary: Negative.   Musculoskeletal: Negative.   Skin: Negative.   Neurological: Negative.   Endo/Heme/Allergies: Negative.   Psychiatric/Behavioral:  Positive for hallucinations and memory loss. The patient is nervous/anxious and has insomnia.      Psychiatric and Social History  Psychiatric History:  Information collected from patient, her spouse and chart review  Prev Dx/Sx: anxiety Current Psych Provider: denies Home Meds (current): as listed  below Previous Med Trials: deferred Therapy: denies  Prior Psych Hospitalization: denies  Prior Self Harm: denies Prior Violence: denies  Family Psych History: deferred Family Hx suicide: deferred  Social History:  Developmental Hx: deferred Educational Hx: deferred Occupational Hx: deferred Legal Hx: deferred Living Situation: lives at home with her spouse Spiritual Hx: references God; Access to weapons/lethal means: denies   Substance History Alcohol: denies  Illicit drugs: denies Prescription drug abuse: denies Rehab hx: n/a  Exam Findings  Physical Exam:  Vital Signs:  Temp:  [98.6 F (37 C)-98.7 F (37.1 C)] 98.6 F (37 C) (05/16 1940) Pulse Rate:  [90-111] 111 (05/16 1940) Resp:  [14-18] 16 (05/16 1940) BP: (83-145)/(37-79) 124/79 (05/16 1940) SpO2:  [89 %-100 %] 100 % (05/16 1940) Blood pressure 124/79, pulse (!) 111, temperature 98.6 F (37 C), resp. rate 16, height 5' (1.524 m), weight 56.2 kg, SpO2 100%. Body mass index is 24.22 kg/m.  Physical Exam Cardiovascular:     Rate and Rhythm: Normal rate.  Musculoskeletal:        General: Normal range of motion.     Cervical back: Normal range of motion.  Neurological:     Mental Status: She is alert. Mental status is at baseline. She is disoriented.  Psychiatric:        Attention and Perception: She perceives auditory (reports seeing shadows, episodically) hallucinations.        Mood and Affect: Mood is anxious. Affect is labile and tearful.        Speech: Speech normal.        Behavior: Behavior is hyperactive.        Thought Content: Thought content does not include homicidal or suicidal ideation. Thought content does not include homicidal or suicidal plan.        Cognition and Memory: Cognition is impaired. Memory is impaired.        Judgment: Judgment is impulsive.     Mental Status Exam: General Appearance: Fairly Groomed  Orientation:  Full (Time, Place, and Person)  Memory:  Immediate;    Fair Recent;   Fair Remote;   Fair  Concentration:  Concentration: Poor and Attention Span: Poor  Recall:  Poor  Attention  Fair  Eye Contact:  Fair  Speech:  Clear and Coherent  Language:  Good  Volume:  Decreased  Mood: Anxious  Affect:  Congruent, Labile, and Tearful  Thought Process:  Irrelevant  Thought Content:  Hallucinations: Auditory  Suicidal Thoughts:  No  Homicidal Thoughts:  No  Judgement:  Impaired  Insight:  Lacking  Psychomotor Activity:  Increased  Akathisia:  No  Fund of Knowledge:  Fair      Assets:  Solicitor Social Support  Cognition:  Impaired,  Mild  ADL's:  Intact  AIMS (if indicated):        Other History   These have been pulled in through the EMR, reviewed, and updated if appropriate.  Family History:  The patient's family history includes Breast cancer (age of onset: 27) in her maternal aunt.  Medical History: Past Medical History:  Diagnosis Date   (HFpEF) heart failure with preserved ejection fraction (HCC)    Anemia due to chronic kidney disease    Chronic gastritis    CKD (chronic kidney disease), stage III (HCC)    Cognitive decline    DM2 (diabetes mellitus, type 2) (HCC)    Gastritis, Helicobacter pylori 2015   GERD (gastroesophageal reflux disease)    Wernicke-Korsakoff syndrome (HCC)     Surgical History: Past Surgical History:  Procedure Laterality Date   CESAREAN SECTION     CHOLECYSTECTOMY     DILATION AND CURETTAGE OF UTERUS     ESOPHAGOGASTRODUODENOSCOPY N/A 07/18/2019   Procedure: ESOPHAGOGASTRODUODENOSCOPY (EGD);  Surgeon: Toledo, Alphonsus Jeans, MD;  Location: [facility] ENDOSCOPY;  Service: Gastroenterology;  Laterality: N/A;     Medications:   Current Facility-Administered Medications:    acetaminophen  (TYLENOL ) tablet 500 mg, 500 mg, Oral, Q6H PRN, Zhang, Ping T, MD   dicyclomine  (BENTYL ) capsule 10 mg, 10 mg, Oral, BID PRN, Zhang, Ping T, MD   feeding supplement  (ENSURE ENLIVE / ENSURE PLUS) liquid 237 mL, 237 mL, Oral, TID BM, Antoniette Batty T, MD, 237 mL at 10/21/23 2133   heparin  injection 5,000 Units, 5,000 Units, Subcutaneous, Q8H, Antoniette Batty T, MD, 5,000 Units at 10/21/23 2131   insulin  aspart (novoLOG ) injection 0-9 Units, 0-9 Units, Subcutaneous, TID WC, Antoniette Batty T, MD   melatonin tablet 2.5 mg, 2.5 mg, Oral, QHS, Zhang, Ping T, MD, 2.5 mg at 10/21/23 2132   OLANZapine  (ZYPREXA ) injection 2.5 mg, 2.5 mg, Intramuscular, Q6H PRN, Antoniette Batty T, MD, 2.5 mg at 10/21/23 2132   ondansetron  (ZOFRAN ) tablet 4 mg, 4 mg, Oral, Q6H PRN **OR** ondansetron  (ZOFRAN ) injection 4 mg, 4 mg, Intravenous, Q6H PRN, Antoniette Batty T, MD   pantoprazole  (PROTONIX ) EC tablet 40 mg, 40 mg, Oral, Daily, Antoniette Batty T, MD, 40 mg at 10/21/23 1657   sodium bicarbonate  tablet 650 mg, 650 mg, Oral, BID, Zhang, Ping T, MD, 650 mg at 10/21/23 2132   traZODone  (DESYREL ) tablet 100 mg, 100 mg, Oral, QHS, Zhang, Ping T, MD, 100 mg at 10/21/23 2132  Allergies: Allergies  Allergen Reactions   Sulfa Antibiotics Hives   Codeine Hives    Sherwood Castilla E Ellijah Leffel, NP

## 2023-10-21 NOTE — ED Notes (Signed)
 Pt states she felt like her blood sugar was low and she felt shaky and wanted it checked before she ate her breakfast, Cbg was checked and it was 46mg /dl. Pt was CAOx4, able to eat and drink, pt given 8oz of juice to drink, provider notified. Pt continued eating her breakfast and CBG to rechecked.

## 2023-10-21 NOTE — TOC CM/SW Note (Addendum)
 Patient being assessed by [facility].  PT eval pending.  TOC will follow-up.

## 2023-10-21 NOTE — ED Notes (Signed)
 Attempted to see patient for consult, she was asleep, LCAS unable to get patient awake for interview, reports recent medication provided, will need to rescheduled once patient  more alert.

## 2023-10-21 NOTE — ED Provider Notes (Signed)
 Care assumed of patient from outgoing provider.  See their note for initial history, exam and plan.  Clinical Course as of 10/21/23 1610  Fri Oct 21, 2023  0017 Patient slightly more agitated after family members left.  Plan to give a dose of Haldol .  Had already received Ativan .  Lab work reassuring.  MRI with no acute findings.  Plan for evaluation by psychiatry and social work.  PT and OT ordered. [SM]    Clinical Course User Index [SM] Viviano Ground, MD     Viviano Ground, MD 10/21/23 [phone]

## 2023-10-21 NOTE — ED Notes (Signed)
 Pt given word search book.

## 2023-10-21 NOTE — Evaluation (Signed)
 Occupational Therapy Evaluation Patient Details Name: Deanna Joseph MRN: [id number] DOB: 01/25/1966 Today's Date: 10/21/2023   History of Present Illness   58 y.o. female with a history of hypertension, HFpEF, diabetes, CKD stage III, GERD, H. Pylori, and Wernicke-Korsakoff syndrome who presents with apparent increased episodes of confusion, anxiety, and sleep difficulty which have been chronic but the confusion has worsened over the last week.   Clinical Impressions Pt was seen for OT evaluation this date. Prior to hospital admission, pt reports being typically indep with toileting and dressing, requiring PRN assist for dressing when she doesn't feel well. Spouse assists with all IADL and bathing. Pt does not recall recent falls. Pt is a questionable historian and spouse not present and unable to verify PLOF/home set up. Pt reports living with her spouse in a 1 story home with steps to enter. She reports using a cane for in home mobility and uses a transport chair when going out of the home. Pt presents to acute OT demonstrating impaired ADL performance and functional mobility 2/2 impaired cognition, balance, strength, and safety (See OT problem list for additional functional deficits). Pt currently requires CGA for bed mobility, transfers, and ambulation in room with RW. CGA for grooming tasks while standing at the sink, MIN A for dressing, CGA for standing pericare and clothing mgt after toileting. Intermittent VC for safety/sequencing during session. Pt set up with breakfast once returned to bed, requiring some assist to identify items (points to cup of grits and asks if that's her water). RN notified of pt's request for water and also notes that she feels a bit shaky once returned to bed. RN came to check blood sugar, per pt's request. Pt left with RN for additional care. Pt would benefit from skilled OT services to address noted impairments and functional limitations (see below for any additional  details) in order to maximize safety and independence while minimizing falls risk and caregiver burden. Anticipate the need for increased supervision/assist at home to ensure safety and follow up OT services upon acute hospital DC.    If plan is discharge home, recommend the following:   A little help with walking and/or transfers;A little help with bathing/dressing/bathroom;Assistance with cooking/housework;Assist for transportation;Help with stairs or ramp for entrance;Direct supervision/assist for medications management     Functional Status Assessment   Patient has had a recent decline in their functional status and demonstrates the ability to make significant improvements in function in a reasonable and predictable amount of time.     Equipment Recommendations   None recommended by OT      Precautions/Restrictions   Precautions Precautions: Fall Recall of Precautions/Restrictions: Impaired Restrictions Weight Bearing Restrictions Per Provider Order: No     Mobility Bed Mobility Overal bed mobility: Needs Assistance Bed Mobility: Supine to Sit, Sit to Supine     Supine to sit: Contact guard, HOB elevated Sit to supine: Contact guard assist, HOB elevated        Transfers Overall transfer level: Needs assistance Equipment used: Rolling walker (2 wheels) Transfers: Sit to/from Stand Sit to Stand: Contact guard assist       Balance Overall balance assessment: Needs assistance Sitting-balance support: No upper extremity supported, Feet supported Sitting balance-Leahy Scale: Good     Standing balance support: No upper extremity supported, During functional activity, Reliant on assistive device for balance, Bilateral upper extremity supported Standing balance-Leahy Scale: Fair Standing balance comment: stood at sink to wash hands without LOB, required BUE support on RW for  mobility        ADL either performed or assessed with clinical judgement   ADL  Overall ADL's : Needs assistance/impaired Eating/Feeding: Sitting;Set up   Grooming: Standing;Contact guard assist;Wash/dry hands           Upper Body Dressing : Sitting;Set up   Lower Body Dressing: Sit to/from stand;Minimal assistance   Toilet Transfer: Contact guard assist;Regular Toilet;Rolling walker (2 wheels)   Toileting- Clothing Manipulation and Hygiene: Supervision/safety;Contact guard assist;Sitting/lateral lean;Sit to/from stand Toileting - Clothing Manipulation Details (indicate cue type and reason): supv for sitting pericare, CGA in standing for pericare and clothing mgt     Functional mobility during ADLs: Contact guard assist;Rolling walker (2 wheels);Cueing for safety        Pertinent Vitals/Pain Pain Assessment Pain Assessment: No/denies pain     Extremity/Trunk Assessment Upper Extremity Assessment Upper Extremity Assessment: Generalized weakness   Lower Extremity Assessment Lower Extremity Assessment: Generalized weakness       Communication Communication Communication: No apparent difficulties   Cognition Arousal: Alert Behavior During Therapy: WFL for tasks assessed/performed Cognition: No family/caregiver present to determine baseline, Cognition impaired   Orientation impairments: Place, Time, Situation Awareness: Intellectual awareness intact, Online awareness impaired Memory impairment (select all impairments): Short-term memory, Declarative long-term memory, Working Biochemist, clinical functioning impairment (select all impairments): Reasoning, Problem solving OT - Cognition Comments: Pt wakes with sternal rub, repeats stories, oriented to self only    Following commands: Impaired Following commands impaired: Only follows one step commands consistently, Follows one step commands with increased time     Cueing  General Comments   Cueing Techniques: Verbal cues              Home Living Family/patient expects to be discharged  to:: Private residence Living Arrangements: Spouse/significant other Available Help at Discharge: Family;Available PRN/intermittently Type of Home: House Home Access: Stairs to enter [employer] of Steps: pt unable to say   Home Layout: One level     Bathroom Shower/Tub: Producer, television/film/video: Handicapped height (BSC over toilet)     Home Equipment: Cane - single Personnel officer (2 wheels);Shower seat;BSC/3in1   Additional Comments: questionable historian, family not present to verify      Prior Functioning/Environment Prior Level of Function : Patient poor historian/Family not available;Needs assist             Mobility Comments: SPC in the house, transport chair out of the home; pt denies recent falls; pt thinks she was getting PT at home ADLs Comments: indep withtoileting and usually with dressing but if not feeling well spouse assists PRN with dressing, assist for bathing, spouse does majority of meal prep and cleaning; assist for med mgt and transportation    OT Problem List: Decreased strength;Decreased cognition;Decreased safety awareness;Impaired balance (sitting and/or standing);Decreased knowledge of use of DME or AE   OT Treatment/Interventions: Self-care/ADL training;Therapeutic exercise;Therapeutic activities;Cognitive remediation/compensation;DME and/or AE instruction;Patient/family education;Balance training      OT Goals(Current goals can be found in the care plan section)   Acute Rehab OT Goals Patient Stated Goal: go home OT Goal Formulation: With patient Time For Goal Achievement: 11/04/23 Potential to Achieve Goals: Good   OT Frequency:  Min 1X/week       AM-PAC OT "6 Clicks" Daily Activity     Outcome Measure Help from another person eating meals?: A Little Help from another person taking care of personal grooming?: A Little Help from another person toileting, which includes  using toliet, bedpan, or  urinal?: A Little Help from another person bathing (including washing, rinsing, drying)?: A Little Help from another person to put on and taking off regular upper body clothing?: A Little Help from another person to put on and taking off regular lower body clothing?: A Little 6 Click Score: 18   End of Session Equipment Utilized During Treatment: Rolling walker (2 wheels) Nurse Communication: Other (comment) (pt wants BG checked and water)  Activity Tolerance: Patient tolerated treatment well Patient left: in bed;with call bell/phone within reach;with bed alarm set  OT Visit Diagnosis: Unsteadiness on feet (R26.81);Muscle weakness (generalized) (M62.81);Other symptoms and signs involving cognitive function                Time: 1610-9604 OT Time Calculation (min): 30 min Charges:  OT General Charges $OT Visit: 1 Visit OT Evaluation $OT Eval Low Complexity: 1 Low OT Treatments $Self Care/Home Management : 8-22 mins  Berenda Breaker., MPH, MS, OTR/L ascom [phone] 10/21/23, 9:46 AM

## 2023-10-22 DIAGNOSIS — E11649 Type 2 diabetes mellitus with hypoglycemia without coma: Secondary | ICD-10-CM | POA: Diagnosis not present

## 2023-10-22 DIAGNOSIS — N178 Other acute kidney failure: Secondary | ICD-10-CM | POA: Diagnosis not present

## 2023-10-22 DIAGNOSIS — F419 Anxiety disorder, unspecified: Secondary | ICD-10-CM | POA: Diagnosis not present

## 2023-10-22 DIAGNOSIS — I9589 Other hypotension: Secondary | ICD-10-CM | POA: Diagnosis not present

## 2023-10-22 DIAGNOSIS — R443 Hallucinations, unspecified: Secondary | ICD-10-CM | POA: Diagnosis not present

## 2023-10-22 DIAGNOSIS — R410 Disorientation, unspecified: Secondary | ICD-10-CM

## 2023-10-22 DIAGNOSIS — F0630 Mood disorder due to known physiological condition, unspecified: Secondary | ICD-10-CM

## 2023-10-22 DIAGNOSIS — R4189 Other symptoms and signs involving cognitive functions and awareness: Secondary | ICD-10-CM

## 2023-10-22 DIAGNOSIS — R4182 Altered mental status, unspecified: Secondary | ICD-10-CM | POA: Diagnosis not present

## 2023-10-22 DIAGNOSIS — G4700 Insomnia, unspecified: Secondary | ICD-10-CM | POA: Diagnosis not present

## 2023-10-22 DIAGNOSIS — G4709 Other insomnia: Secondary | ICD-10-CM | POA: Diagnosis not present

## 2023-10-22 DIAGNOSIS — N1832 Chronic kidney disease, stage 3b: Secondary | ICD-10-CM | POA: Diagnosis not present

## 2023-10-22 DIAGNOSIS — R000 Tachycardia, unspecified: Secondary | ICD-10-CM | POA: Diagnosis not present

## 2023-10-22 LAB — GLUCOSE, CAPILLARY
Glucose-Capillary: 82 mg/dL (ref 70–99)
Glucose-Capillary: 108 mg/dL — ABNORMAL HIGH (ref 70–99)
Glucose-Capillary: 88 mg/dL (ref 70–99)
Glucose-Capillary: 130 mg/dL — ABNORMAL HIGH (ref 70–99)

## 2023-10-22 LAB — BASIC METABOLIC PANEL WITH GFR
Anion gap: 11 (ref 5–15)
BUN: 23 mg/dL — ABNORMAL HIGH (ref 6–20)
CO2: 23 mmol/L (ref 22–32)
Calcium: 9.1 mg/dL (ref 8.9–10.3)
Chloride: 103 mmol/L (ref 98–111)
Creatinine, Ser: 1.41 mg/dL — ABNORMAL HIGH (ref 0.44–1.00)
GFR, Estimated: 44 mL/min — ABNORMAL LOW (ref >60)
Glucose, Bld: 95 mg/dL (ref 70–99)
Potassium: 4.2 mmol/L (ref 3.5–5.1)
Sodium: 137 mmol/L (ref 135–145)

## 2023-10-22 LAB — PROTEIN / CREATININE RATIO, URINE
Creatinine, Urine: 81 mg/dL
Protein Creatinine Ratio: 0.85 mg/mg{creat} — ABNORMAL HIGH (ref 0.00–0.15)
Total Protein, Urine: 69 mg/dL

## 2023-10-22 LAB — HIV ANTIBODY (ROUTINE TESTING W REFLEX): HIV Screen 4th Generation wRfx: NONREACTIVE

## 2023-10-22 MED ORDER — QUETIAPINE FUMARATE 25 MG PO TABS
25.0000 mg | ORAL_TABLET | Freq: Every day | ORAL | Status: DC
  Administered 2023-10-22 – 2023-10-24 (×3): 25 mg via ORAL
  Filled 2023-10-22 (×3): qty 1

## 2023-10-22 MED ORDER — QUETIAPINE FUMARATE 25 MG PO TABS
12.5000 mg | ORAL_TABLET | ORAL | Status: DC
Start: 2023-10-23 — End: 2023-10-25
  Administered 2023-10-23 – 2023-10-25 (×6): 12.5 mg via ORAL
  Filled 2023-10-22 (×6): qty 1

## 2023-10-22 MED ORDER — LORAZEPAM 0.5 MG PO TABS
0.5000 mg | ORAL_TABLET | Freq: Four times a day (QID) | ORAL | Status: DC | PRN
  Administered 2023-10-22 – 2023-10-24 (×4): 0.5 mg via ORAL
  Filled 2023-10-22 (×4): qty 1

## 2023-10-22 NOTE — Progress Notes (Signed)
 Mobility Specialist - Progress Note   10/22/23 1022  Mobility  Activity Ambulated with assistance in hallway;Stood at bedside;Dangled on edge of bed  Level of Assistance Contact guard assist, steadying assist  Assistive Device Cane  Distance Ambulated (ft) 180 ft  Activity Response Tolerated well  Mobility Referral Yes  Mobility visit 1 Mobility  Mobility Specialist Start Time (ACUTE ONLY) 0946  Mobility Specialist Stop Time (ACUTE ONLY) 1001  Mobility Specialist Time Calculation (min) (ACUTE ONLY) 15 min   Pt sitting EOB on RA upon arrival. Pt STS and ambulates in hallway CGA. Pt takes 4 standing rest breaks throughout duration. Pt returns to EOB with needs in reach and bed alarm activated.   Wash Hack  Mobility Specialist  10/22/23 10:24 AM

## 2023-10-22 NOTE — Progress Notes (Signed)
 EKG completed again and copy placed on chart. Scanner for armband does not work on EKG machine therefore patient's CSN # and name entered in EKG machine. EKG reads Sinus tachycardia.

## 2023-10-22 NOTE — Progress Notes (Signed)
 Spoke with pt's husband, April Knack, to see if he was able to come sit with patient. He is unable to come until this afternoon after 1400. Pt has been asking for her husband. Will update patient at this time.

## 2023-10-22 NOTE — Progress Notes (Signed)
  Progress Note   [name]:[id number] DOB: 1966-06-02 DOA: 10/20/2023     0 DOS: the patient was seen and examined on 10/22/2023   Brief hospital course: Deanna Joseph is a 58 y.o. female with medical history significant of CKD stage IIIb, HTN, chronic HFpEF, IIDM, anxiety/depression, brought in by family member for evaluation of altered mentations.  Patient has a severe insomnia, she also has history of cognitive abnormality.  She was also found to have significant hypoglycemia.   Principal Problem:   AMS (altered mental status) Active Problems:   GERD without esophagitis   Diet-controlled diabetes mellitus (HCC)   Acute renal failure superimposed on stage 3b chronic kidney disease (HCC)   Metabolic acidosis   Hypotension   Depression   Cognitive deficits   Insomnia   Type 2 diabetes mellitus with hypoglycemia (HCC)   Assessment and Plan: Altered mental status with increased confusion, delirium. Cognitive deficits. Chronic insomnia. Patient has a chronic insomnia, came to hospital with increased confusion. No infection was identified. Will try to improve his sleep pattern by adding Seroquel in addition to melatonin. Patient still has significant confusion, will keep patient for another day. Check a B12 level.  Type 2 diabetes with hypoglycemia. Transient hypotension. A1c 4.9.  It appears that the patient was not taking any diabetic medicines.  Due to significant hypoglycemia, and transient hypotension, will check a.m. cortisol level.  TSH normal. Patient blood pressure is running higher today, but will hold off blood pressure medicine for now.  Acute kidney injury on chronic kidney disease stage IIIb. Metabolic acidosis resolved. Renal function has back to baseline.       Subjective:  Patient is anxious, with some confusion.  Physical Exam: Vitals:   10/21/23 1558 10/21/23 1600 10/21/23 1940 10/22/23 0700  BP: (!) 83/37 138/64 124/79 (!)  151/63  Pulse: 90 92 (!) 111 (!) 102  Resp: 18  16 20   Temp: 98.7 F (37.1 C)  98.6 F (37 C) 98 F (36.7 C)  TempSrc: Oral   Oral  SpO2: (!) 89%  100% 98%  Weight:      Height:       General exam: Appears calm and comfortable  Respiratory system: Clear to auscultation. Respiratory effort normal. Cardiovascular system: S1 & S2 heard, RRR. No JVD, murmurs, rubs, gallops or clicks. No pedal edema. Gastrointestinal system: Abdomen is nondistended, soft and nontender. No organomegaly or masses felt. Normal bowel sounds heard. Central nervous system: Alert and oriented x2.  No focal neurological deficits. Extremities: Symmetric 5 x 5 power. Skin: No rashes, lesions or ulcers Psychiatry: Anxious.    Data Reviewed:  MRI brain without significant abnormality, lab results reviewed.  Family Communication: None  Disposition: Status is: Observation      Time spent: 50 minutes  Author: Donaciano Frizzle, MD 10/22/2023 10:11 AM  For on call review [email or web address].

## 2023-10-22 NOTE — Progress Notes (Signed)
 Occupational Therapy Treatment Patient Details Name: Deanna Joseph MRN: [id number] DOB: 1966/05/21 Today's Date: 10/22/2023   History of present illness Pt is a 58 y.o. female presenting to hospital 10/20/23 with c/o chest pain.  Per ED MD note (info via pt's husband): "He states that she has had poor sleep over some time, but it has worsened this week.  She was started on lorazepam  in addition to trazodone  for sleep, and has been sleeping better, but now has also been intermittently disoriented throughout the day.  She has had increased anxiety.  She also reports some abdominal discomfort and had reported chest pain in triage although denies any chest pain now.  She does report some difficulty breathing.  She has no vomiting or diarrhea.  She has a very strong appetite and often wants to eat every 15 minutes.  She has no fever.  She was seen by her primary care provider and instructed to come to the ED for further evaluation". PMH includes HFpEF, stage III CKD, DM, H. Pylori, Wernicke-Korsakoff syndrome, CKD stage III.   OT comments  Pt seen for OT tx. Pt received in the recliner, endorsing feeling like her heart was fast. HR at rest 120's, up to 130's with ambulating to the bathroom and back. RN notified. MIN A from recliner to stand and CGA versus CGA and handheld assist while using hurry cane. Pt requiring VC during session for safety. Reassurance provided when pt asked if she would "die" if she reclined back in the recliner. Pt continues to benefit. Will require heavy supv/assist upon discharge for safety 2/2 cognition, weakness, and balance.       If plan is discharge home, recommend the following:  A little help with walking and/or transfers;A little help with bathing/dressing/bathroom;Assistance with cooking/housework;Assist for transportation;Help with stairs or ramp for entrance;Direct supervision/assist for medications management;Supervision due to cognitive status;Direct supervision/assist  for financial management   Equipment Recommendations  None recommended by OT    Recommendations for Other Services      Precautions / Restrictions Precautions Precautions: Fall Recall of Precautions/Restrictions: Impaired Precaution/Restrictions Comments: watch HR Restrictions Weight Bearing Restrictions Per Provider Order: No       Mobility Bed Mobility               General bed mobility comments: NT, in recliner    Transfers Overall transfer level: Needs assistance Equipment used: Rolling walker (2 wheels) Transfers: Sit to/from Stand Sit to Stand: Min assist, Contact guard assist           General transfer comment: MIN A for initial transfer from recliner, CGA from toilet     Balance Overall balance assessment: Needs assistance Sitting-balance support: No upper extremity supported, Feet supported Sitting balance-Leahy Scale: Good     Standing balance support: Single extremity supported, Bilateral upper extremity supported, No upper extremity supported, During functional activity, Reliant on assistive device for balance Standing balance-Leahy Scale: Fair Standing balance comment: required hurry cane and intermittent handheld assist with mobility, able to tolerate static standing at sink without LOB                           ADL either performed or assessed with clinical judgement   ADL Overall ADL's : Needs assistance/impaired     Grooming: Standing;Supervision/safety;Wash/dry Producer, television/film/video;Ambulation Toilet Transfer Details (indicate  cue type and reason): hurry cane Toileting- Clothing Manipulation and Hygiene: Contact guard assist;Sit to/from stand       Functional mobility during ADLs: Contact guard assist;Cueing for safety;Cane      Extremity/Trunk Assessment              Vision       Perception     Praxis     Communication  Communication Communication: No apparent difficulties   Cognition Arousal: Alert Behavior During Therapy: Anxious Cognition: No family/caregiver present to determine baseline, Cognition impaired             OT - Cognition Comments: Pt asks OT, "If I lay back (against the back of the recliner), I'll die, right?"                 Following commands: Impaired Following commands impaired: Only follows one step commands consistently, Follows one step commands with increased time      Cueing   Cueing Techniques: Verbal cues  Exercises      Shoulder Instructions       General Comments Pt endorsed feeling like her heart was going to "fall out of my chest", HR 130 with mobility, 120's with rest, RN notified.    Pertinent Vitals/ Pain       Pain Assessment Pain Assessment: No/denies pain  Home Living                                          Prior Functioning/Environment              Frequency  Min 1X/week        Progress Toward Goals  OT Goals(current goals can now be found in the care plan section)  Progress towards OT goals: Progressing toward goals  Acute Rehab OT Goals Patient Stated Goal: go home OT Goal Formulation: With patient Time For Goal Achievement: 11/04/23 Potential to Achieve Goals: Good  Plan      Co-evaluation                 AM-PAC OT "6 Clicks" Daily Activity     Outcome Measure   Help from another person eating meals?: A Little Help from another person taking care of personal grooming?: A Little Help from another person toileting, which includes using toliet, bedpan, or urinal?: A Little Help from another person bathing (including washing, rinsing, drying)?: A Little Help from another person to put on and taking off regular upper body clothing?: A Little Help from another person to put on and taking off regular lower body clothing?: A Little 6 Click Score: 18    End of Session Equipment Utilized  During Treatment: Other (comment) (cane)  OT Visit Diagnosis: Unsteadiness on feet (R26.81);Muscle weakness (generalized) (M62.81);Other symptoms and signs involving cognitive function   Activity Tolerance Patient tolerated treatment well   Patient Left in chair;with call bell/phone within reach;with chair alarm set   Nurse Communication Other (comment) (HR)        Time: 3086-5784 OT Time Calculation (min): 11 min  Charges: OT General Charges $OT Visit: 1 Visit OT Treatments $Self Care/Home Management : 8-22 mins  Berenda Breaker., MPH, MS, OTR/L ascom [phone] 10/22/23, 12:48 PM

## 2023-10-22 NOTE — Progress Notes (Signed)
 Unable to complete HCPOA paperwork-pt needs to be to hospital standard A&Ox4- spoke with RN and Charge RN both confirmed pt is confused and does not meet this criteria. Explained that if there is a window of lucidity contact chaplain services

## 2023-10-22 NOTE — Progress Notes (Addendum)
 Notified Dr. Jeane Miguel that patient is extremily anxious, c/o her chest hurting and like her heart is pounding out of her chest. I did an EKG but the machine is messed up and will not print, EKG reads Stach. she has repeatedly called her mom to come get her and take her to the hospital. her mom called this RN and says that the patient sounds like she is having a panic attack. the patient can not stay still. she seems like she is crawling out of her skin and is now hallucinating seeing bugs in the room. MD acknowledged and stated he would order some ativan .

## 2023-10-22 NOTE — Plan of Care (Signed)
  Problem: Coping: Goal: Ability to adjust to condition or change in health will improve Outcome: Progressing   Problem: Nutritional: Goal: Maintenance of adequate nutrition will improve Outcome: Progressing Goal: Progress toward achieving an optimal weight will improve Outcome: Progressing   Problem: Skin Integrity: Goal: Risk for impaired skin integrity will decrease Outcome: Progressing   Problem: Tissue Perfusion: Goal: Adequacy of tissue perfusion will improve Outcome: Progressing   Problem: Clinical Measurements: Goal: Ability to maintain clinical measurements within normal limits will improve Outcome: Progressing Goal: Will remain free from infection Outcome: Progressing Goal: Diagnostic test results will improve Outcome: Progressing Goal: Respiratory complications will improve Outcome: Progressing Goal: Cardiovascular complication will be avoided Outcome: Progressing   Problem: Activity: Goal: Risk for activity intolerance will decrease Outcome: Progressing

## 2023-10-22 NOTE — Progress Notes (Signed)
 Patient was with PT at arrival. Will round back this afternoon

## 2023-10-22 NOTE — Hospital Course (Addendum)
 Deanna Joseph is a 58 y.o. female with medical history significant of CKD stage IIIb, HTN, chronic HFpEF, IIDM, anxiety/depression, brought in by family member for evaluation of altered mentations.  Patient has a severe insomnia, she also has history of cognitive abnormality.  She was also found to have significant hypoglycemia. Patient condition was mainly from severe insomnia, she has not been sleeping for days.  Once came to the hospital, she was started on melatonin and Seroquel .  We discontinued trazodone .  She slept well last night, she is less confused.

## 2023-10-22 NOTE — Progress Notes (Signed)
 Patient repeatedly tries to get out of bed. Redirectable but requires frequent reminders. Notified Dr. Jeane Miguel that tele sitter order placed but that there are no tele sitters available at this time and that patient is now on the list to receive one if it should become available. Also spoke with Paola Bohr, RN charge nurse and asked about moving patient closer to nursing station if a room becomes available. Bed alarm on.

## 2023-10-22 NOTE — Consult Note (Signed)
 [facility] Psychiatric Consult Follow up  Patient Name: .Deanna Joseph  MRN: [id number]  DOB: Oct 10, 1965  Consult Order details:  Orders (From admission, onward)     Start     Ordered   10/20/23 2049  IP CONSULT TO PSYCHIATRY       Ordering Provider: Lind Repine, MD  Provider:  (Not yet assigned)  Question Answer Comment  Place call to: Psych provider   Reason for Consult Consult      10/20/23 2048   10/20/23 2049  CONSULT TO CALL ACT TEAM       Ordering Provider: Lind Repine, MD  Provider:  (Not yet assigned)  Question:  Reason for Consult?  Answer:  Psych consult   10/20/23 2048                Psychiatry Consult Evaluation  Service Date: Oct 22, 2023 LOS:  LOS: 0 days  Chief Complaint "  Primary Psychiatric Diagnoses  Altered Mental Status 2.  Hallucinations 3.  Insomnia  Assessment  Deanna Joseph is a 58 y.o. female admitted: Presented to the EDfor 10/20/2023  5:30 PM for evaluation of CP, hallucinations, insomnia and chronic medication issues. She carries the psychiatric diagnoses of Depression, Anxiety and has a past medical history of  HTN, GERD, acute on chronic CHF, type 2 DM, Wernicke-Korsakoff syndrome, non-alcoholic, stage 3a chronic kidney disease. Patient initially presented to the emergency department for evaluation of chest pain with differential diagnosis of UTI versus other primary CNS etiology or primary psychiatric concerns.    10/22/23: She does not have SI/HI denies hallucinations Stop trazodone ;  -Increase seroquel 12.5mg  po  qam; at 2pm and 25 mg at bedtime at bedtime for sleep, hallucinations;   -Continue Lorazepam  0.5mg  po BID prn severe anxiety. Benzodiazepines are not the best option but considering patient was already prescribed lorazepam , will defer changes to her primary prescriber.  -Recommend OP follow up with neurology for cognitive concerns.  -Therapy referral is deferred as her cognitive decline may interfere with  her ability to participate.  -Referral to [facility] for memory care information/resources  Diagnoses:  Active Hospital problems: Principal Problem:   AMS (altered mental status) Active Problems:   Diet-controlled diabetes mellitus (HCC)   Acute renal failure superimposed on stage 3b chronic kidney disease (HCC)   Metabolic acidosis   Hypotension   Depression   Cognitive deficits   GERD without esophagitis   Insomnia   Type 2 diabetes mellitus with hypoglycemia (HCC)    Plan   ## Psychiatric Medication Recommendations:  -Recommend she stop trazodone ;  --Increase seroquel 12.5mg  po  qam; at 2pm and 25 mg at bedtime at bedtime for sleep, hallucinations;  -Continue Lorazepam  0.5mg  po BID prn severe anxiety. Benzodiazepines are not the best option but considering patient was already prescribed lorazepam , will defer changes to her primary prescriber.   Non-medication recommendations: -Enter TOC referral for assistance with memory care unit  ## Medical Decision Making Capacity: Not specifically addressed in this encounter; that has hx for cognitive impairment, her husband is her primary caregiver and makes decisions for her.  No legal guardian paperwork on file.   ## Further Work-up:  -- Deferred to ED Provider While pt on Qtc prolonging medications, please monitor & replete K+ to 4 and Mg2+ to 2 -- most recent EKG on 10/20/2023 had QtC of 416 -- Pertinent labwork reviewed earlier this admission includes:  CMP, CBC,    ## Disposition:-- There are no psychiatric contraindications to discharge  at this time  ## Behavioral / Environmental: -Utilize compassion and acknowledge the patient's experiences while setting clear and realistic expectations for care.    ## Safety and Observation Level:  - Based on my clinical evaluation, I estimate the patient to be at low risk of self harm in the current setting. - At this time, we recommend  routine. This decision is based on my review of the  chart including patient's history and current presentation, interview of the patient, mental status examination, and consideration of suicide risk including evaluating suicidal ideation, plan, intent, suicidal or self-harm behaviors, risk factors, and protective factors. This judgment is based on our ability to directly address suicide risk, implement suicide prevention strategies, and develop a safety plan while the patient is in the clinical setting. Please contact our team if there is a concern that risk level has changed.  CSSR Risk Category:C-SSRS RISK CATEGORY: No Risk  Suicide Risk Assessment: Patient has following modifiable risk factors for suicide: dx hx for cognitive impairment, sleep concerns which we are addressing by recommending seroquel for sleep and hallucinations; entering [facility] consult for memory care unit information. Patient has following non-modifiable or demographic risk factors for suicide: hx for cognitive decline Patient has the following protective factors against suicide: Supportive family, no history of suicide attempts, and no history of NSSIB  Thank you for this consult request. Recommendations have been communicated to the primary team.  We will sign off at this time.   Aurelia Blotter, MD       History of Present Illness  Relevant Aspects of Hospital ED Course:  Admitted on 10/20/2023 for CP, hallucinations.  She initially presented to the emergency with c/o chest pain was found to have underlying mental health concerns that was thought to contribute to her presentation.   Per RN Triage note dated 10/20/2023@1619 : Patient to ED via POV for generalized CP. Started Sunday. Per husband patient has had AMS during this time with hallucinations. Reports that she is hearing things and at times rocks back and forth.   Per ED Provider Admission Assessment 10/20/2023@1734  HPI   Deanna Joseph is a 58 y.o. female with a history of hypertension, HFpEF, diabetes, CKD stage III,  and GERD who presents with multiple complaints which are chronic but worsening.  The patient's husband is the primary historian.  He states that she has had poor sleep over some time, but it has worsened this week.  She was started on lorazepam  in addition to trazodone  for sleep, and has been sleeping better, but now has also been intermittently disoriented throughout the day.  She has had increased anxiety.  She also reports some abdominal discomfort and had reported chest pain in triage although denies any chest pain now.  She does report some difficulty breathing.  She has no vomiting or diarrhea.  She has a very strong appetite and often wants to eat every 15 minutes.  She has no fever.  She was seen by her primary care provider and instructed to come to the ED for further evaluation.   I reviewed the past medical records.  The patient was seen in the ED on 4/24 with abdominal and pain.  Workup was negative at that time.  Previously she was seen in December also for abdominal pain with a negative workup.  She was last admitted to the hospital service in August of last year for chest pain with minimally elevated troponins.    10/22/23: Patient is noted to be sitting in  chair and visibly very anxious.  She remains distracted at times but denies SI/HI and denies hallucinations.  Per nursing patient has constant needs of one-to-one attention and significant amount of anxiety.  She did receive as needed Ativan  early in the day.  Patient asked the provider to ask her 2 times "if she has eaten and that she should eat ".  She reports she is very hungry.  She displays some bizarre behaviors intermittently.  Collateral information:  Contacted patient's spouse, April Knack @[phone],AVW provides the following information: He has hx of stroke and is the primary caregiver for his wife.  He reports he has some limitations in caring for his wife but he does the best he can.  He states her 2 sons and her mother all  live down the street and do not offer assistance.  He states he has church friends that occasionally visit but they cannot help with ADLs. He reports patient was combative, restless and hallucinating so her brought her to the hospital for evaluation. He reports his wife has a hx for dementia and has demonstrated progressive cognitive decline.  He reports he's interested in getting her to a memory care unit but cannot afford it financially and is concerned her insurance will not cover it.  He reports she is followed by her primary care provider who previously prescribed trazodone  and lorazepam  for sleep/anxiety.  He reports the last Monday, patient was up for >24 hours and trazodone  150mg  did not work her her.  He gave her lorazepam  0.5mg  which allowed her to sleep for about 12 hours.    Review of Systems  Constitutional: Negative.   HENT: Negative.    Eyes: Negative.   Respiratory: Negative.    Cardiovascular: Negative.   Gastrointestinal: Negative.   Genitourinary: Negative.   Musculoskeletal: Negative.   Skin: Negative.   Neurological: Negative.   Endo/Heme/Allergies: Negative.   Psychiatric/Behavioral:  Positive for hallucinations and memory loss. The patient is nervous/anxious and has insomnia.      Psychiatric and Social History  Psychiatric History:  Information collected from patient, her spouse and chart review  Prev Dx/Sx: anxiety Current Psych Provider: denies Home Meds (current): as listed below Previous Med Trials: deferred Therapy: denies  Prior Psych Hospitalization: denies  Prior Self Harm: denies Prior Violence: denies  Family Psych History: deferred Family Hx suicide: deferred  Social History:  Developmental Hx: deferred Educational Hx: deferred Occupational Hx: deferred Legal Hx: deferred Living Situation: lives at home with her spouse Spiritual Hx: references God; Access to weapons/lethal means: denies   Substance History Alcohol: denies  Illicit  drugs: denies Prescription drug abuse: denies Rehab hx: n/a  Exam Findings  Physical Exam:  Vital Signs:  Temp:  [97.6 F (36.4 C)-98.6 F (37 C)] 97.6 F (36.4 C) (05/17 1650) Pulse Rate:  [95-130] 95 (05/17 1650) Resp:  [16-20] 20 (05/17 1650) BP: (116-151)/(63-79) 116/67 (05/17 1650) SpO2:  [98 %-100 %] 99 % (05/17 1650) Blood pressure 116/67, pulse 95, temperature 97.6 F (36.4 C), temperature source Oral, resp. rate 20, height 5' (1.524 m), weight 56.2 kg, SpO2 99%. Body mass index is 24.22 kg/m.  Physical Exam Cardiovascular:     Rate and Rhythm: Normal rate.  Musculoskeletal:        General: Normal range of motion.     Cervical back: Normal range of motion.  Neurological:     Mental Status: She is alert. Mental status is at baseline. She is disoriented.  Psychiatric:  Attention and Perception: She perceives auditory (reports seeing shadows, episodically) hallucinations.        Mood and Affect: Mood is anxious. Affect is labile and tearful.        Speech: Speech normal.        Behavior: Behavior is hyperactive.        Thought Content: Thought content does not include homicidal or suicidal ideation. Thought content does not include homicidal or suicidal plan.        Cognition and Memory: Cognition is impaired. Memory is impaired.        Judgment: Judgment is impulsive.     Mental Status Exam: General Appearance: Fairly Groomed  Orientation:  Full (Time, Place, and Person)  Memory:  Immediate;   Fair Recent;   Fair Remote;   Fair  Concentration:  Concentration: Poor and Attention Span: Poor  Recall:  Poor  Attention  Fair  Eye Contact:  Fair  Speech:  Clear and Coherent  Language:  Good  Volume:  Decreased  Mood: Anxious  Affect:  Congruent, Labile, and Tearful  Thought Process:  Irrelevant  Thought Content:  Hallucinations: Auditory  Suicidal Thoughts:  No  Homicidal Thoughts:  No  Judgement:  Impaired  Insight:  Lacking  Psychomotor Activity:   Increased  Akathisia:  No  Fund of Knowledge:  Fair      Assets:  Solicitor Social Support  Cognition:  Impaired,  Mild  ADL's:  Intact  AIMS (if indicated):        Other History   These have been pulled in through the EMR, reviewed, and updated if appropriate.  Family History:  The patient's family history includes Breast cancer (age of onset: 81) in her maternal aunt.  Medical History: Past Medical History:  Diagnosis Date   (HFpEF) heart failure with preserved ejection fraction (HCC)    Anemia due to chronic kidney disease    Chronic gastritis    CKD (chronic kidney disease), stage III (HCC)    Cognitive decline    DM2 (diabetes mellitus, type 2) (HCC)    Gastritis, Helicobacter pylori 2015   GERD (gastroesophageal reflux disease)    Wernicke-Korsakoff syndrome (HCC)     Surgical History: Past Surgical History:  Procedure Laterality Date   CESAREAN SECTION     CHOLECYSTECTOMY     DILATION AND CURETTAGE OF UTERUS     ESOPHAGOGASTRODUODENOSCOPY N/A 07/18/2019   Procedure: ESOPHAGOGASTRODUODENOSCOPY (EGD);  Surgeon: Toledo, Alphonsus Jeans, MD;  Location: [facility] ENDOSCOPY;  Service: Gastroenterology;  Laterality: N/A;     Medications:   Current Facility-Administered Medications:    acetaminophen  (TYLENOL ) tablet 500 mg, 500 mg, Oral, Q6H PRN, Zhang, Ping T, MD, 500 mg at 10/22/23 1012   dicyclomine  (BENTYL ) capsule 10 mg, 10 mg, Oral, BID PRN, Zhang, Ping T, MD   feeding supplement (ENSURE ENLIVE / ENSURE PLUS) liquid 237 mL, 237 mL, Oral, TID BM, Antoniette Batty T, MD, 237 mL at 10/22/23 1415   heparin  injection 5,000 Units, 5,000 Units, Subcutaneous, Q8H, Antoniette Batty T, MD, 5,000 Units at 10/22/23 1414   insulin  aspart (novoLOG ) injection 0-9 Units, 0-9 Units, Subcutaneous, TID WC, Zhang, Herbie Loll T, MD   LORazepam  (ATIVAN ) tablet 0.5 mg, 0.5 mg, Oral, Q6H PRN, Zhang, Dekui, MD, 0.5 mg at 10/22/23 1205   melatonin tablet 2.5 mg,  2.5 mg, Oral, QHS, Zhang, Ping T, MD, 2.5 mg at 10/21/23 2132   OLANZapine  (ZYPREXA ) injection 2.5 mg, 2.5 mg, Intramuscular, Q6H PRN, Antoniette Batty T,  MD, 2.5 mg at 10/21/23 2132   ondansetron  (ZOFRAN ) tablet 4 mg, 4 mg, Oral, Q6H PRN **OR** ondansetron  (ZOFRAN ) injection 4 mg, 4 mg, Intravenous, Q6H PRN, Zhang, Ping T, MD   pantoprazole  (PROTONIX ) EC tablet 40 mg, 40 mg, Oral, Daily, Antoniette Batty T, MD, 40 mg at 10/22/23 1012   QUEtiapine (SEROQUEL) tablet 25 mg, 25 mg, Oral, QHS, Zhang, Dekui, MD  Allergies: Allergies  Allergen Reactions   Sulfa Antibiotics Hives   Codeine Hives    Lakrisha Iseman, MD

## 2023-10-23 DIAGNOSIS — R410 Disorientation, unspecified: Secondary | ICD-10-CM | POA: Diagnosis not present

## 2023-10-23 DIAGNOSIS — I509 Heart failure, unspecified: Secondary | ICD-10-CM | POA: Diagnosis not present

## 2023-10-23 DIAGNOSIS — G4709 Other insomnia: Secondary | ICD-10-CM

## 2023-10-23 DIAGNOSIS — R4189 Other symptoms and signs involving cognitive functions and awareness: Secondary | ICD-10-CM | POA: Diagnosis not present

## 2023-10-23 DIAGNOSIS — R269 Unspecified abnormalities of gait and mobility: Secondary | ICD-10-CM | POA: Diagnosis not present

## 2023-10-23 LAB — BASIC METABOLIC PANEL WITH GFR
Anion gap: 10 (ref 5–15)
BUN: 26 mg/dL — ABNORMAL HIGH (ref 6–20)
CO2: 24 mmol/L (ref 22–32)
Calcium: 8.5 mg/dL — ABNORMAL LOW (ref 8.9–10.3)
Chloride: 103 mmol/L (ref 98–111)
Creatinine, Ser: 1.53 mg/dL — ABNORMAL HIGH (ref 0.44–1.00)
GFR, Estimated: 39 mL/min — ABNORMAL LOW (ref >60)
Glucose, Bld: 134 mg/dL — ABNORMAL HIGH (ref 70–99)
Potassium: 3.8 mmol/L (ref 3.5–5.1)
Sodium: 137 mmol/L (ref 135–145)

## 2023-10-23 LAB — GLUCOSE, CAPILLARY
Glucose-Capillary: 106 mg/dL — ABNORMAL HIGH (ref 70–99)
Glucose-Capillary: 93 mg/dL (ref 70–99)
Glucose-Capillary: 107 mg/dL — ABNORMAL HIGH (ref 70–99)

## 2023-10-23 LAB — CORTISOL-AM, BLOOD: Cortisol - AM: 4.2 ug/dL — ABNORMAL LOW (ref 6.7–22.6)

## 2023-10-23 LAB — VITAMIN B12: Vitamin B-12: 484 pg/mL (ref 180–914)

## 2023-10-23 LAB — MAGNESIUM: Magnesium: 1.8 mg/dL (ref 1.7–2.4)

## 2023-10-23 MED ORDER — QUETIAPINE FUMARATE 25 MG PO TABS: 25.0000 mg | ORAL_TABLET | Freq: Every day | ORAL | 0 refills | Status: AC

## 2023-10-23 MED ORDER — MELATONIN 5 MG PO TABS
2.5000 mg | ORAL_TABLET | Freq: Every day | ORAL | 0 refills | Status: AC
Start: 2023-10-23 — End: ?

## 2023-10-23 NOTE — Progress Notes (Signed)
 Met husband, he is concerned about unsafe discharge. Will cancel discharge, TOC to see.

## 2023-10-23 NOTE — TOC Progression Note (Signed)
 Transition of Care [facility]) - Progression Note    Patient Details  Name: Deanna Joseph MRN: [id number] Date of Birth: 12-Jul-1965  [facility]) CM/SW Contact  Crayton Docker, RN Phone Number: 10/23/2023, 12:50 PM  Clinical Narrative:      CM to patient's room per patient's husband request. Patient's husband, [name] in room with patient. Patient in recliner. CM and patient's husband, discussed therapy recommendations of home health and patient's Managed Medicaid of [employer].  CM and patient''s husband also discussed Managed Medicaid does not cover home health. CM and patient's husband discussed possible Outpatient Rehab. Patient's husband prefers home health through [facility]. Patient's husband is agreeable to [facility] is only 3 minutes from home.     Barriers to Discharge: Continued Medical Work up  Expected Discharge Plan and Services    Outpatient Rehab   Living arrangements for the past 2 months: Single Family Home Expected Discharge Date: 10/23/23                 Social Determinants of Health (SDOH) Interventions SDOH Screenings   Food Insecurity: No Food Insecurity (10/21/2023)  Housing: Low Risk  (10/21/2023)  Transportation Needs: No Transportation Needs (10/21/2023)  Utilities: Not At Risk (10/21/2023)  Financial Resource Strain: Low Risk  (02/26/2020)   Received from [facility], [facility]  Recent Concern: Financial Resource Strain - Medium Risk (01/13/2020)   Received from [facility]  Social Connections: Moderately Integrated (10/21/2023)  Tobacco Use: Low Risk  (10/21/2023)    Readmission Risk Interventions     No data to display

## 2023-10-23 NOTE — Progress Notes (Signed)
 MD at bedside. Husband with concern for ability to care for patient at home. Awaiting input from [facility] for possible memory care placement.   Discharge held for now.

## 2023-10-23 NOTE — Progress Notes (Signed)
 Patient being discharged. Husband, [name], called for transportation home. LVM

## 2023-10-23 NOTE — Progress Notes (Signed)
 Mobility Specialist - Progress Note   10/23/23 0932  Mobility  Activity Ambulated with assistance in hallway  Level of Assistance Standby assist, set-up cues, supervision of patient - no hands on  Assistive Device Front wheel walker  Distance Ambulated (ft) 120 ft  Activity Response Tolerated well  Mobility Referral Yes  Mobility visit 1 Mobility  Mobility Specialist Start Time (ACUTE ONLY) 0901  Mobility Specialist Stop Time (ACUTE ONLY) [id number]  Mobility Specialist Time Calculation (min) (ACUTE ONLY) 17 min   Pt sitting in recliner on RA upon arrival. Pt STS and ambulates in hallway SBA with no LOB noted. Pt returns to recliner with needs in reach and chair alarm activated.   Wash Hack  Mobility Specialist  10/23/23 9:34 AM

## 2023-10-23 NOTE — Progress Notes (Signed)
 Patient transferred to wheelchair SBA with [place] can.  Patient's friend is pushing patient in wheelchair throughout hospital to change scene to increase mood.

## 2023-10-23 NOTE — Progress Notes (Signed)
 Patient made the following comments during today's day shift: "I don't want my husband to catch me in bed.  He told me not to and he would be mad." "I'm scared that he'll find out and come back." "I think he's already given me pain medicine today." Patient appears visually afraid while making these statements.  Veronia Goon, RN present for comments.

## 2023-10-23 NOTE — Discharge Summary (Signed)
 Physician Discharge Summary   [name] MRN: [id number] DOB: 14-Mar-1966  Admit date:     10/20/2023  Discharge date: 10/23/23  Discharge Physician: Donaciano Frizzle   PCP: Comer Decamp, MD   Recommendations at discharge:   Follow-up with PCP in 1 week. Follow-up the results of B12 and cortisol level.  Discharge Diagnoses: Principal Problem:   AMS (altered mental status) Active Problems:   GERD without esophagitis   Diet-controlled diabetes mellitus (HCC)   Acute renal failure superimposed on stage 3b chronic kidney disease (HCC)   Metabolic acidosis   Hypotension   Depression   Cognitive deficits   Insomnia   Type 2 diabetes mellitus with hypoglycemia (HCC)  Resolved Problems:   * No resolved hospital problems. *  Hospital Course: AYODELE HARTSOCK is a 58 y.o. female with medical history significant of CKD stage IIIb, HTN, chronic HFpEF, IIDM, anxiety/depression, brought in by family member for evaluation of altered mentations.  Patient has a severe insomnia, she also has history of cognitive abnormality.  She was also found to have significant hypoglycemia. Patient condition was mainly from severe insomnia, she has not been sleeping for days.  Once came to the hospital, she was started on melatonin and Seroquel.  We discontinued trazodone .  She slept well last night, she is less confused.  She wished to go home.  Medically stable for discharge.  Assessment and Plan: Altered mental status with increased confusion, delirium. Cognitive deficits. Chronic insomnia. Patient has a chronic insomnia, came to hospital with increased confusion. No infection was identified. Will try to improve his sleep pattern by adding Seroquel in addition to melatonin. Patient had a good night sleep last night, feel better.  Wish to go home today.  Medically stable for discharge.   Type 2 diabetes with hypoglycemia. Transient hypotension. A1c 4.9.  It appears that the patient was not  taking any diabetic medicines.  Due to significant hypoglycemia, and transient hypotension, will check a.m. cortisol level.  TSH normal. Cortisol level was ordered yesterday, results still pending.  Please follow-up on the results. Blood pressure has been better, but will hold off blood pressure medicine at this time. Patient no longer has any hypoglycemia, she would not need any diabetic medicine at this time.   Acute kidney injury on chronic kidney disease stage IIIb. Metabolic acidosis resolved. Renal function has back to baseline.         Consultants: Psych Procedures performed: None  Disposition: Home health Diet recommendation:  Discharge Diet Orders (From admission, onward)     Start     Ordered   10/23/23 0000  Diet - low sodium heart healthy        10/23/23 0932           Cardiac diet DISCHARGE MEDICATION: Allergies as of 10/23/2023       Reactions   Sulfa Antibiotics Hives   Codeine Hives        Medication List     STOP taking these medications    furosemide  40 MG tablet Commonly known as: LASIX    metoprolol  succinate 25 MG 24 hr tablet Commonly known as: TOPROL -XL   nitrofurantoin  (macrocrystal-monohydrate) 100 MG capsule Commonly known as: MACROBID    spironolactone  25 MG tablet Commonly known as: ALDACTONE    traMADol  50 MG tablet Commonly known as: ULTRAM    traZODone  100 MG tablet Commonly known as: DESYREL    traZODone  50 MG tablet Commonly known as: DESYREL        TAKE these medications  acetaminophen  500 MG tablet Commonly known as: TYLENOL  Take 500 mg by mouth every 6 (six) hours as needed.   dicyclomine  10 MG capsule Commonly known as: BENTYL  Take 1 capsule (10 mg total) by mouth 2 (two) times daily as needed for spasms.   dicyclomine  20 MG tablet Commonly known as: BENTYL  Take 20 mg by mouth 4 (four) times daily as needed (stomach pains). For 7 days   feeding supplement Liqd Take 237 mLs by mouth 3 (three) times  daily between meals.   LORazepam  0.5 MG tablet Commonly known as: ATIVAN  Take 0.5 mg by mouth at bedtime.   melatonin 5 MG Tabs Take 0.5 tablets (2.5 mg total) by mouth at bedtime.   omeprazole 40 MG capsule Commonly known as: PRILOSEC Take 40 mg by mouth daily as needed.   QUEtiapine 25 MG tablet Commonly known as: SEROQUEL Take 1 tablet (25 mg total) by mouth at bedtime.        Follow-up Information     Cephus Collin, MD Follow up in 1 week(s).   Specialty: Pain Medicine Why: Worsening of CKD and proteinuria. CT in APril ok. Contact information: [address] [place] [address] [phone]         [facility] Follow up.   Why: Chronic insomnia, polypharmacy Contact [address] [place] [address] [phone]         Comer Decamp, MD Follow up in 1 week(s).   Specialty: Family Medicine Contact information: [address] [place] [address] [phone]                Discharge Exam: Deanna Joseph Weights   10/20/23 1622  Weight: 56.2 kg   General exam: Appears calm and comfortable  Respiratory system: Clear to auscultation. Respiratory effort normal. Cardiovascular system: S1 & S2 heard, RRR. No JVD, murmurs, rubs, gallops or clicks. No pedal edema. Gastrointestinal system: Abdomen is nondistended, soft and nontender. No organomegaly or masses felt. Normal bowel sounds heard. Central nervous system: Alert and oriented x2. No focal neurological deficits. Extremities: Symmetric 5 x 5 power. Skin: No rashes, lesions or ulcers Psychiatry:  Mood & affect appropriate.    Condition at discharge: good  The results of significant diagnostics from this hospitalization (including imaging, microbiology, ancillary and laboratory) are listed below for reference.   Imaging Studies: MR Brain W and Wo Contrast Result Date: 10/20/2023 CLINICAL DATA:  Altered mental status EXAM: MRI HEAD WITHOUT AND  WITH CONTRAST TECHNIQUE: Multiplanar, multiecho pulse sequences of the brain and surrounding structures were obtained without and with intravenous contrast. CONTRAST:  5mL GADAVIST  GADOBUTROL  1 MMOL/ML IV SOLN COMPARISON:  09/30/2022 FINDINGS: Brain: No acute infarct, mass effect or extra-axial collection. No acute or chronic hemorrhage. There is multifocal hyperintense T2-weighted signal within the white matter. Age-advanced volume loss. 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 Dilated temporal horns of the lateral ventricles with anterior temporal predominant volume loss. The midline structures are normal. Vascular: Normal flow voids. Skull and upper cervical spine: Normal calvarium and skull base. Visualized upper cervical spine and soft tissues are normal. Sinuses/Orbits:No paranasal sinus fluid levels or advanced mucosal thickening. No mastoid or middle ear effusion. Normal orbits. IMPRESSION: 1. No acute intracranial abnormality. 2. Dilated temporal horns of the lateral ventricles with anterior temporal predominant volume loss. 3. 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 Electronically Signed   By: Juanetta Nordmann M.D.   On: 10/20/2023 22:55  CT Head Wo Contrast Result Date: 10/20/2023 CLINICAL DATA:  Mental status change, unknown cause EXAM: CT HEAD WITHOUT CONTRAST TECHNIQUE: Contiguous axial images were obtained from the base of the skull through the vertex without intravenous contrast. RADIATION DOSE REDUCTION: This exam was performed according to the departmental dose-optimization program which includes automated exposure control, adjustment of the mA and/or kV according to patient size and/or use of iterative reconstruction technique. COMPARISON:  01/11/2023 FINDINGS: Brain: Old left thalamic lacunar infarct. No acute intracranial abnormality. Specifically, no hemorrhage, hydrocephalus, mass lesion, acute infarction, or significant intracranial injury.  Vascular: No hyperdense vessel or unexpected calcification. Skull: No acute calvarial abnormality. Sinuses/Orbits: No acute findings Other: None IMPRESSION: No acute intracranial abnormality. Electronically Signed   By: Janeece Mechanic M.D.   On: 10/20/2023 19:09   DG Chest 2 View Result Date: 10/20/2023 CLINICAL DATA:  Chest pain. EXAM: CHEST - 2 VIEW COMPARISON:  09/29/2023. FINDINGS: Low lung volumes. The heart size and mediastinal contours are within normal limits. Both lungs are clear. No pleural effusion or pneumothorax. No acute osseous abnormality. IMPRESSION: No active cardiopulmonary disease. Electronically Signed   By: Mannie Seek M.D.   On: 10/20/2023 17:18   CT ABDOMEN PELVIS WO CONTRAST Result Date: 09/29/2023 CLINICAL DATA:  Right-sided abdominal pain. EXAM: CT ABDOMEN AND PELVIS WITHOUT CONTRAST TECHNIQUE: Multidetector CT imaging of the abdomen and pelvis was performed following the standard protocol without IV contrast. RADIATION DOSE REDUCTION: This exam was performed according to the departmental dose-optimization program which includes automated exposure control, adjustment of the mA and/or kV according to patient size and/or use of iterative reconstruction technique. COMPARISON:  May 15, 2023 FINDINGS: Lower chest: No acute abnormality. Hepatobiliary: No focal liver abnormality is seen. Status post cholecystectomy. No biliary dilatation. Pancreas: Unremarkable. No pancreatic ductal dilatation or surrounding inflammatory changes. Spleen: Normal in size without focal abnormality. Adrenals/Urinary Tract: Adrenal glands are unremarkable. Kidneys are normal in size, without renal calculi or hydronephrosis. A small stable right renal cyst is noted. No follow-up imaging is recommended. Bladder is unremarkable. Stomach/Bowel: Stomach is poorly distended and is otherwise within normal limits. Appendix appears normal. No evidence of bowel wall thickening, distention, or inflammatory changes.  Vascular/Lymphatic: Aortic atherosclerosis. No enlarged abdominal or pelvic lymph nodes. Reproductive: The uterus is not clearly identified and may be surgically absent. The bilateral adnexa are unremarkable. Other: No abdominal wall hernia or abnormality. No abdominopelvic ascites. Musculoskeletal: No acute or significant osseous findings. IMPRESSION: 1. No evidence of acute or active process within the abdomen or pelvis. 2. Evidence of prior cholecystectomy. 3. Aortic atherosclerosis. Electronically Signed   By: Virgle Grime M.D.   On: 09/29/2023 23:12   DG Chest 2 View Result Date: 09/29/2023 CLINICAL DATA:  Chest pain EXAM: CHEST - 2 VIEW COMPARISON:  Chest x-ray 05/15/2023 FINDINGS: The heart size and mediastinal contours are within normal limits. Both lungs are clear. The visualized skeletal structures are unremarkable. IMPRESSION: No active cardiopulmonary disease. Electronically Signed   By: Tyron Gallon M.D.   On: 09/29/2023 20:31    Microbiology: Results for orders placed or performed during the hospital encounter of 01/16/23  SARS Coronavirus 2 by RT PCR (hospital order, performed in [facility] hospital lab) *cepheid single result test* Anterior Nasal Swab     Status: None   Collection Time: 01/16/23  8:27 PM   Specimen: Anterior Nasal Swab  Result Value Ref Range Status   SARS Coronavirus 2 by RT PCR NEGATIVE NEGATIVE Final    Comment: (  NOTE) SARS-CoV-2 target nucleic acids are NOT DETECTED.  The SARS-CoV-2 RNA is generally detectable in upper and lower respiratory specimens during the acute phase of infection. The lowest concentration of SARS-CoV-2 viral copies this assay can detect is 250 copies / mL. A negative result does not preclude SARS-CoV-2 infection and should not be used as the sole basis for treatment or other patient management decisions.  A negative result may occur with improper specimen collection / handling, submission of specimen other than nasopharyngeal  swab, presence of viral mutation(s) within the areas targeted by this assay, and inadequate number of viral copies (<250 copies / mL). A negative result must be combined with clinical observations, patient history, and epidemiological information.  Fact Sheet for Patients:   RoadLapTop.co.za  Fact Sheet for Healthcare Providers: http://kim-miller.com/  This test is not yet approved or  cleared by the [place]  FDA and has been authorized for detection and/or diagnosis of SARS-CoV-2 by FDA under an Emergency Use Authorization (EUA).  This EUA will remain in effect (meaning this test can be used) for the duration of the COVID-19 declaration under Section 564(b)(1) of the Act, 21 U.S.C. section 360bbb-3(b)(1), unless the authorization is terminated or revoked sooner.  Performed at [facility], [address] [place], [place] [address]     Labs: CBC: Recent Labs  Lab 10/20/23 1624  WBC 6.1  HGB 11.8*  HCT 35.2*  MCV 89.3  PLT 261   Basic Metabolic Panel: Recent Labs  Lab 10/20/23 1624 10/22/23 0614 10/23/23 0343  NA 135 137 137  K 4.1 4.2 3.8  CL 100 103 103  CO2 16* 23 24  GLUCOSE 70 95 134*  BUN 32* 23* 26*  CREATININE 2.01* 1.41* 1.53*  CALCIUM  9.1 9.1 8.5*  MG  --   --  1.8   Liver Function Tests: Recent Labs  Lab 10/20/23 1624  AST 30  ALT 20  ALKPHOS 59  BILITOT 1.3*  PROT 7.6  ALBUMIN 4.3   CBG: Recent Labs  Lab 10/22/23 0835 10/22/23 1210 10/22/23 1805 10/22/23 2045 10/23/23 0751  GLUCAP 82 108* 88 130* 106*    Discharge time spent: greater than 30 minutes.  Signed: Donaciano Frizzle, MD [facility] 10/23/2023

## 2023-10-24 DIAGNOSIS — I9589 Other hypotension: Secondary | ICD-10-CM

## 2023-10-24 DIAGNOSIS — R4189 Other symptoms and signs involving cognitive functions and awareness: Secondary | ICD-10-CM | POA: Diagnosis not present

## 2023-10-24 DIAGNOSIS — R410 Disorientation, unspecified: Secondary | ICD-10-CM | POA: Diagnosis not present

## 2023-10-24 LAB — GLUCOSE, CAPILLARY
Glucose-Capillary: 114 mg/dL — ABNORMAL HIGH (ref 70–99)
Glucose-Capillary: 127 mg/dL — ABNORMAL HIGH (ref 70–99)
Glucose-Capillary: 141 mg/dL — ABNORMAL HIGH (ref 70–99)
Glucose-Capillary: 89 mg/dL (ref 70–99)
Glucose-Capillary: 147 mg/dL — ABNORMAL HIGH (ref 70–99)

## 2023-10-24 MED ORDER — COSYNTROPIN 0.25 MG IJ SOLR: 0.2500 mg | Freq: Once | INTRAMUSCULAR | Status: DC

## 2023-10-24 MED ORDER — COSYNTROPIN 0.25 MG IJ SOLR
0.2500 mg | Freq: Once | INTRAMUSCULAR | Status: AC
  Administered 2023-10-25: 0.25 mg via INTRAVENOUS
  Filled 2023-10-24: qty 0.25

## 2023-10-24 MED ORDER — LORAZEPAM 1 MG PO TABS
1.0000 mg | ORAL_TABLET | Freq: Four times a day (QID) | ORAL | Status: DC | PRN
  Administered 2023-10-24 – 2023-10-25 (×2): 1 mg via ORAL
  Filled 2023-10-24 (×2): qty 1

## 2023-10-24 NOTE — Progress Notes (Signed)
  Progress Note   [name] ZOX:[id number] DOB: [birth year] DOA: 10/20/2023     0 DOS: the patient was seen and examined on 10/24/2023   Brief hospital course: Deanna Joseph is a 58 y.o. female with medical history significant of CKD stage IIIb, HTN, chronic HFpEF, IIDM, anxiety/depression, brought in by family member for evaluation of altered mentations.  Patient has a severe insomnia, she also has history of cognitive abnormality.  She was also found to have significant hypoglycemia. Patient condition was mainly from severe insomnia, she has not been sleeping for days.  Once came to the hospital, she was started on melatonin and Seroquel .  We discontinued trazodone .  She slept well last night, she is less confused.     Principal Problem:   AMS (altered mental status) Active Problems:   GERD without esophagitis   Diet-controlled diabetes mellitus (HCC)   Acute renal failure superimposed on stage 3b chronic kidney disease (HCC)   Metabolic acidosis   Hypotension   Depression   Cognitive deficits   Insomnia   Type 2 diabetes mellitus with hypoglycemia (HCC)   Assessment and Plan: Altered mental status with increased confusion, delirium. Cognitive deficits. Chronic insomnia. Patient has a chronic insomnia, came to hospital with increased confusion. No infection was identified. Will try to improve his sleep pattern by adding Seroquel  in addition to melatonin. Patient has been sleeping better for the last 2 nights, has been requesting discharge, states not safe to go home.  Has been seen by TOC, no option for placement.  Plan to discharge tomorrow after workup for hypoglycemia.   Type 2 diabetes with hypoglycemia. Transient hypotension. A1c 4.9.  It appears that the patient was not taking any diabetic medicines.  Due to significant hypoglycemia, and transient hypotension, will check a.m. cortisol level.  TSH normal. Cortisol level was ordered yesterday, results still  pending.  Please follow-up on the results. Blood pressure has been better, but will hold off blood pressure medicine at this time. Cortisol level only 4.2, she potentially has adrenal insufficiency.  Obtain ACTH stimulation tomorrow.   Acute kidney injury on chronic kidney disease stage IIIb. Metabolic acidosis resolved. Renal function has back to baseline.        Subjective:  Patient still have significant anxiety, baseline confusion.  Physical Exam: Vitals:   10/24/23 1000 10/24/23 1034 10/24/23 1039 10/24/23 1140  BP: 110/66   107/66  Pulse: 99 (!) 123 98 (!) 109  Resp: 18   18  Temp: 98.5 F (36.9 C)   97.7 F (36.5 C)  TempSrc: Oral     SpO2: 100%   99%  Weight:      Height:       General exam: Appears calm and comfortable  Respiratory system: Clear to auscultation. Respiratory effort normal. Cardiovascular system: S1 & S2 heard, RRR. No JVD, murmurs, rubs, gallops or clicks. No pedal edema. Gastrointestinal system: Abdomen is nondistended, soft and nontender. No organomegaly or masses felt. Normal bowel sounds heard. Central nervous system: Alert and oriented x2. No focal neurological deficits. Extremities: Symmetric 5 x 5 power. Skin: No rashes, lesions or ulcers Psychiatry: Judgement and insight appear normal. Mood & affect appropriate.    Data Reviewed:  Lab results reviewed.  Family Communication: None  Disposition: Status is: Observation     Time spent: 35 minutes  Author: Donaciano Frizzle, MD 10/24/2023 1:13 PM  For on call review [email or web address].

## 2023-10-24 NOTE — Progress Notes (Signed)
 Occupational Therapy Treatment Patient Details Name: Deanna Joseph MRN: [id number] DOB: 05/19/1966 Today's Date: 10/24/2023   History of present illness Pt is a 58 y.o. female presenting to hospital 10/20/23 with c/o chest pain.  Per ED MD note (info via pt's husband): "He states that she has had poor sleep over some time, but it has worsened this week.  She was started on lorazepam  in addition to trazodone  for sleep, and has been sleeping better, but now has also been intermittently disoriented throughout the day.  She has had increased anxiety.  She also reports some abdominal discomfort and had reported chest pain in triage although denies any chest pain now.  She does report some difficulty breathing.  She has no vomiting or diarrhea.  She has a very strong appetite and often wants to eat every 15 minutes.  She has no fever.  She was seen by her primary care provider and instructed to come to the ED for further evaluation". PMH includes HFpEF, stage III CKD, DM, H. Pylori, Wernicke-Korsakoff syndrome, CKD stage III.   OT comments  Pt seen for OT tx. Pt received in the recliner, chair alarm on. Pt appears anxious, states, "I think my husband told me to not sit down. Is that ok?" Pt reassured that the recliner is not harmful and educated in falls risk and need for assistance with all mobility at this time. Pt verbalized understanding. Pt required initial MIN A-CGA to stand from the recliner with hurry cane. Pt ambulated to bathroom with CGA. She required CGA for standing pericare and MIN A for clothing mgt. Pt returned to recliner, endorsing her chest and her heart hurt. HR 123, SpO2 100%. MD notified as he entered the room. Pt left with all needs in reach, chair alarm on. Will continue to progress as able.       If plan is discharge home, recommend the following:  A little help with walking and/or transfers;A little help with bathing/dressing/bathroom;Assistance with cooking/housework;Assist for  transportation;Help with stairs or ramp for entrance;Direct supervision/assist for medications management;Supervision due to cognitive status;Direct supervision/assist for financial management   Equipment Recommendations  None recommended by OT    Recommendations for Other Services      Precautions / Restrictions Precautions Precautions: Fall Recall of Precautions/Restrictions: Impaired Precaution/Restrictions Comments: watch HR Restrictions Weight Bearing Restrictions Per Provider Order: No       Mobility Bed Mobility               General bed mobility comments: NT, in recliner    Transfers Overall transfer level: Needs assistance Equipment used:  (hurry cane) Transfers: Sit to/from Stand Sit to Stand: Min assist, Contact guard assist           General transfer comment: MIN A for initial transfer from recliner, CGA from toilet     Balance Overall balance assessment: Needs assistance Sitting-balance support: No upper extremity supported, Feet supported Sitting balance-Leahy Scale: Good     Standing balance support: Single extremity supported, Bilateral upper extremity supported, No upper extremity supported, During functional activity, Reliant on assistive device for balance Standing balance-Leahy Scale: Fair Standing balance comment: required hurry cane with mobility, able to tolerate static standing at sink without LOB                           ADL either performed or assessed with clinical judgement   ADL Overall ADL's : Needs assistance/impaired     Grooming:  Standing;Supervision/safety;Wash/dry hands           Upper Body Dressing : Sitting;Set up   Lower Body Dressing: Sit to/from stand;Minimal assistance   Toilet Transfer: Contact guard Nurse, adult Details (indicate cue type and reason): hurry cane, BSC placed over toilet for improved comfort and safety wiht future transfers Toileting- Clothing  Manipulation and Hygiene: Contact guard assist;Sit to/from stand       Functional mobility during ADLs: Contact guard assist;Cueing for safety;Cane      Extremity/Trunk Assessment              Vision       Perception     Praxis     Communication Communication Communication: No apparent difficulties   Cognition Arousal: Alert Behavior During Therapy: Anxious Cognition: No family/caregiver present to determine baseline, Cognition impaired             OT - Cognition Comments: Pt states, "I think my husband told me he doesn't want me sitting."                 Following commands: Impaired Following commands impaired: Only follows one step commands consistently, Follows one step commands with increased time      Cueing   Cueing Techniques: Verbal cues  Exercises Other Exercises Other Exercises: Pt educated in falls prevention    Shoulder Instructions       General Comments      Pertinent Vitals/ Pain       Pain Assessment Pain Assessment: Faces Faces Pain Scale: Hurts little more Pain Location: pt endorses her chest and heart hurt, MD in room as this occurred and cited anxiety Pain Descriptors / Indicators: Aching Pain Intervention(s): Limited activity within patient's tolerance, Monitored during session, Repositioned  Home Living                                          Prior Functioning/Environment              Frequency  Min 1X/week        Progress Toward Goals  OT Goals(current goals can now be found in the care plan section)  Progress towards OT goals: Progressing toward goals  Acute Rehab OT Goals Patient Stated Goal: go home OT Goal Formulation: With patient Time For Goal Achievement: 11/04/23 Potential to Achieve Goals: Good  Plan      Co-evaluation                 AM-PAC OT "6 Clicks" Daily Activity     Outcome Measure   Help from another person eating meals?: A Little Help from another  person taking care of personal grooming?: A Little Help from another person toileting, which includes using toliet, bedpan, or urinal?: A Little Help from another person bathing (including washing, rinsing, drying)?: A Little Help from another person to put on and taking off regular upper body clothing?: A Little Help from another person to put on and taking off regular lower body clothing?: A Little 6 Click Score: 18    End of Session Equipment Utilized During Treatment: Gait belt;Other (comment) (hurry cane)  OT Visit Diagnosis: Unsteadiness on feet (R26.81);Muscle weakness (generalized) (M62.81);Other symptoms and signs involving cognitive function   Activity Tolerance Patient tolerated treatment well   Patient Left in chair;with call bell/phone within reach;with chair alarm set;Other (comment) (MD)   Nurse Communication  Time: [id number] OT Time Calculation (min): 18 min  Charges: OT General Charges $OT Visit: 1 Visit OT Treatments $Self Care/Home Management : 8-22 mins  Berenda Breaker., MPH, MS, OTR/L ascom [phone] 10/24/23, 10:43 AM

## 2023-10-24 NOTE — Progress Notes (Signed)
 Physical Therapy Treatment Patient Details Name: Deanna Joseph MRN: [id number] DOB: 21-Jun-1965 Today's Date: 10/24/2023   History of Present Illness Pt is a 58 y.o. female presenting to hospital 10/20/23 with c/o chest pain.  Per ED MD note (info via pt's husband): "He states that she has had poor sleep over some time, but it has worsened this week.  She was started on lorazepam  in addition to trazodone  for sleep, and has been sleeping better, but now has also been intermittently disoriented throughout the day.  She has had increased anxiety.  She also reports some abdominal discomfort and had reported chest pain in triage although denies any chest pain now.  She does report some difficulty breathing.  She has no vomiting or diarrhea.  She has a very strong appetite and often wants to eat every 15 minutes.  She has no fever.  She was seen by her primary care provider and instructed to come to the ED for further evaluation". PMH includes HFpEF, stage III CKD, DM, H. Pylori, Wernicke-Korsakoff syndrome, CKD stage III.    PT Comments  Pt alert, seated EOB. Did voice a few things and pt admitted to being anxious, relaxation techniques and comfort provided as able. She was able to transfer several times with CGA and use of SPC. She ambulated ~94ft with SPC, short shuffled steps but then verbalized she could walk faster, and did so. no increase in unsteadiness noted. Did switch cane from L to R hand several times but preferred RUE, ambulated ~57ft in total back to room without a seated rest break. Pt seated EOB with alarm on, needs in reach. The patient would benefit from further skilled PT intervention to continue to progress towards goals.    If plan is discharge home, recommend the following: A little help with walking and/or transfers;A little help with bathing/dressing/bathroom;Assistance with cooking/housework;Direct supervision/assist for medications management;Direct supervision/assist for financial  management;Assist for transportation;Help with stairs or ramp for entrance;Supervision due to cognitive status   Can travel by private vehicle        Equipment Recommendations  Rolling walker (2 wheels)    Recommendations for Other Services       Precautions / Restrictions Precautions Precautions: Fall Recall of Precautions/Restrictions: Impaired Precaution/Restrictions Comments: watch HR Restrictions Weight Bearing Restrictions Per Provider Order: No     Mobility  Bed Mobility Overal bed mobility: Needs Assistance             General bed mobility comments: seated EOB at start/end of session    Transfers Overall transfer level: Needs assistance Equipment used: Straight cane Transfers: Sit to/from Stand Sit to Stand: Contact guard assist           General transfer comment: performed several times from EOB and from recliner    Ambulation/Gait Ambulation/Gait assistance: Contact guard assist Gait Distance (Feet):  (chair follow, ambulated ~4ft, then additional 56ft) Assistive device: Straight cane         General Gait Details: short shuffled steps but then verbalized she could walk faster, and did so. no increase in unsteadiness noted. did switch cane from L to R hand several times but preferred RUE   Stairs             Wheelchair Mobility     Tilt Bed    Modified Rankin (Stroke Patients Only)       Balance Overall balance assessment: Needs assistance Sitting-balance support: Feet supported Sitting balance-Leahy Scale: Good Sitting balance - Comments: steady reaching within BOS  Standing balance support: Single extremity supported, Bilateral upper extremity supported, No upper extremity supported, During functional activity, Reliant on assistive device for balance Standing balance-Leahy Scale: Fair                              Communication    Cognition Arousal: Alert Behavior During Therapy: Anxious   PT - Cognitive  impairments: No family/caregiver present to determine baseline                       PT - Cognition Comments: overall anxious Following commands: Impaired Following commands impaired: Only follows one step commands consistently    Cueing Cueing Techniques: Verbal cues  Exercises      General Comments        Pertinent Vitals/Pain Pain Assessment Pain Assessment: Faces Faces Pain Scale: Hurts a little bit Pain Location: did not report wear she was hurting Pain Descriptors / Indicators: Sore    Home Living                          Prior Function            PT Goals (current goals can now be found in the care plan section) Progress towards PT goals: Progressing toward goals    Frequency    Min 2X/week      PT Plan      Co-evaluation              AM-PAC PT "6 Clicks" Mobility   Outcome Measure  Help needed turning from your back to your side while in a flat bed without using bedrails?: None Help needed moving from lying on your back to sitting on the side of a flat bed without using bedrails?: A Little Help needed moving to and from a bed to a chair (including a wheelchair)?: A Little Help needed standing up from a chair using your arms (e.g., wheelchair or bedside chair)?: A Little Help needed to walk in hospital room?: A Little Help needed climbing 3-5 steps with a railing? : A Little 6 Click Score: 19    End of Session Equipment Utilized During Treatment: Gait belt Activity Tolerance: Patient tolerated treatment well Patient left: in bed;with call bell/phone within reach;with bed alarm set;Other (comment) (seated EOB) Nurse Communication: Mobility status PT Visit Diagnosis: Unsteadiness on feet (R26.81);Other abnormalities of gait and mobility (R26.89);Muscle weakness (generalized) (M62.81)     Time: 1610-9604 PT Time Calculation (min) (ACUTE ONLY): 12 min  Charges:    $Therapeutic Activity: 8-22 mins PT General Charges $$  ACUTE PT VISIT: 1 Visit                    Darien Eden PT, DPT 2:53 PM,10/24/23

## 2023-10-24 NOTE — Progress Notes (Signed)
 Mobility Specialist - Progress Note   10/24/23 1100  Mobility  Activity Ambulated with assistance in hallway  Level of Assistance Contact guard assist, steadying assist  Assistive Device Cane  Distance Ambulated (ft) 35 ft  Activity Response Tolerated well  Mobility visit 1 Mobility     Author responded to chair alarm. Pt standing at bedside on arrival with tray blocking pathway. Pt stating "I can't sit down. I need to be up." Pt reporting pain from base of neck down to BLE. Ambulated in hallway with CGA + cane. Further activity limited d/t pt feeling that she was going to pass out. "I don't want to die!". Pt appears anxious and restless at this time. PLB and comfort provided. Pt wheeled back to room via chair. Once seated in recliner, VS recorded. BP on R arm reading 97/78 rechecked and now 104/81. HR 119 at rest. RN notified.    Searcy Czech Mobility Specialist 10/24/23, 11:15 AM

## 2023-10-25 DIAGNOSIS — R410 Disorientation, unspecified: Secondary | ICD-10-CM | POA: Diagnosis not present

## 2023-10-25 DIAGNOSIS — F0630 Mood disorder due to known physiological condition, unspecified: Secondary | ICD-10-CM

## 2023-10-25 DIAGNOSIS — N1832 Chronic kidney disease, stage 3b: Secondary | ICD-10-CM | POA: Diagnosis not present

## 2023-10-25 DIAGNOSIS — R4189 Other symptoms and signs involving cognitive functions and awareness: Secondary | ICD-10-CM | POA: Diagnosis not present

## 2023-10-25 DIAGNOSIS — N178 Other acute kidney failure: Secondary | ICD-10-CM | POA: Diagnosis not present

## 2023-10-25 DIAGNOSIS — F419 Anxiety disorder, unspecified: Secondary | ICD-10-CM | POA: Diagnosis not present

## 2023-10-25 DIAGNOSIS — R4182 Altered mental status, unspecified: Secondary | ICD-10-CM | POA: Diagnosis not present

## 2023-10-25 DIAGNOSIS — R443 Hallucinations, unspecified: Secondary | ICD-10-CM | POA: Diagnosis not present

## 2023-10-25 DIAGNOSIS — G4700 Insomnia, unspecified: Secondary | ICD-10-CM | POA: Diagnosis not present

## 2023-10-25 DIAGNOSIS — G4709 Other insomnia: Secondary | ICD-10-CM | POA: Diagnosis not present

## 2023-10-25 LAB — CORTISOL: Cortisol, Plasma: 40.9 ug/dL

## 2023-10-25 LAB — GLUCOSE, CAPILLARY
Glucose-Capillary: 153 mg/dL — ABNORMAL HIGH (ref 70–99)
Glucose-Capillary: 78 mg/dL (ref 70–99)

## 2023-10-25 MED ORDER — QUETIAPINE FUMARATE 25 MG PO TABS: 12.5000 mg | ORAL_TABLET | Freq: Two times a day (BID) | ORAL | 0 refills | Status: AC
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
## 2023-10-25 NOTE — Consult Note (Signed)
 [facility] Psychiatric Consult Follow up  Patient Name: .Deanna Joseph  MRN: [id number]  DOB: [birth year]  Consult Order details:  Orders (From admission, onward)     Start     Ordered   10/20/23 2049  IP CONSULT TO PSYCHIATRY       Ordering Provider: Lind Repine, MD  Provider:  (Not yet assigned)  Question Answer Comment  Place call to: Psych provider   Reason for Consult Consult      10/20/23 2048   10/20/23 2049  CONSULT TO CALL ACT TEAM       Ordering Provider: Lind Repine, MD  Provider:  (Not yet assigned)  Question:  Reason for Consult?  Answer:  Psych consult   10/20/23 2048                Psychiatry Consult Evaluation  Service Date: Oct 25, 2023 LOS:  LOS: 0 days  Chief Complaint "  Primary Psychiatric Diagnoses  Altered Mental Status 2.  Hallucinations 3.  Insomnia  Assessment  Deanna Joseph is a 58 y.o. female admitted: Presented to the EDfor 10/20/2023  5:30 PM for evaluation of CP, hallucinations, insomnia and chronic medication issues. She carries the psychiatric diagnoses of Depression, Anxiety and has a past medical history of  HTN, GERD, acute on chronic CHF, type 2 DM, Wernicke-Korsakoff syndrome, non-alcoholic, stage 3a chronic kidney disease. Patient initially presented to the emergency department for evaluation of chest pain with differential diagnosis of UTI versus other primary CNS etiology or primary psychiatric concerns.    10/25/23:  -Continue Lorazepam  0.5mg  po BID prn severe anxiety. Benzodiazepines are not the best option but considering patient was already prescribed lorazepam , will defer changes to her primary prescriber.  -Recommend OP follow up with neurology for cognitive concerns.  -Therapy referral is deferred as her cognitive decline may interfere with her ability to participate.    Diagnoses:  Active Hospital problems: Principal Problem:   AMS (altered mental status) Active Problems:   Diet-controlled  diabetes mellitus (HCC)   Acute renal failure superimposed on stage 3b chronic kidney disease (HCC)   Metabolic acidosis   Hypotension   Depression   Cognitive deficits   GERD without esophagitis   Insomnia   Type 2 diabetes mellitus with hypoglycemia (HCC)    Plan   ## Psychiatric Medication Recommendations:  -Recommend she stop trazodone ;  --Increase seroquel  12.5mg  po  qam; at 2pm and 25 mg at bedtime at bedtime for sleep, hallucinations;  -Continue Lorazepam  0.5mg  po BID prn severe anxiety. Benzodiazepines are not the best option but considering patient was already prescribed lorazepam , will defer changes to her primary prescriber.   Non-medication recommendations: -Enter TOC referral for assistance with memory care unit  ## Medical Decision Making Capacity: Not specifically addressed in this encounter; that has hx for cognitive impairment, her husband is her primary caregiver and makes decisions for her.  No legal guardian paperwork on file.   ## Further Work-up:  - While pt on Qtc prolonging medications, please monitor & replete K+ to 4 and Mg2+ to 2 -- most recent EKG on 10/20/2023 had QtC of 416 -- Pertinent labwork reviewed earlier this admission includes:  CMP, CBC,    ## Disposition:-- There are no psychiatric contraindications to discharge at this time  ## Behavioral / Environmental: -Utilize compassion and acknowledge the patient's experiences while setting clear and realistic expectations for care.    ## Safety and Observation Level:  - Based on my clinical evaluation, I  estimate the patient to be at low risk of self harm in the current setting. - At this time, we recommend  routine. This decision is based on my review of the chart including patient's history and current presentation, interview of the patient, mental status examination, and consideration of suicide risk including evaluating suicidal ideation, plan, intent, suicidal or self-harm behaviors, risk  factors, and protective factors. This judgment is based on our ability to directly address suicide risk, implement suicide prevention strategies, and develop a safety plan while the patient is in the clinical setting. Please contact our team if there is a concern that risk level has changed.  CSSR Risk Category:C-SSRS RISK CATEGORY: No Risk  Suicide Risk Assessment: Patient has following modifiable risk factors for suicide: dx hx for cognitive impairment, sleep concerns which we are addressing by recommending seroquel  for sleep and hallucinations; entering [facility] consult for memory care unit information. Patient has following non-modifiable or demographic risk factors for suicide: hx for cognitive decline Patient has the following protective factors against suicide: Supportive family, no history of suicide attempts, and no history of NSSIB  Thank you for this consult request. Recommendations have been communicated to the primary team.  We will sign off at this time.   Aurelia Blotter, MD       History of Present Illness     Deanna Joseph is a 58 y.o. female with a history of hypertension, HFpEF, diabetes, CKD stage III, and GERD who presents with multiple complaints which are chronic but worsening.  The patient's husband is the primary historian.  He states that she has had poor sleep over some time, but it has worsened this week.  She was started on lorazepam  in addition to trazodone  for sleep, and has been sleeping better, but now has also been intermittently disoriented throughout the day.  She has had increased anxiety.  She also reports some abdominal discomfort and had reported chest pain in triage although denies any chest pain now.  She does report some difficulty breathing.  She has no vomiting or diarrhea.  She has a very strong appetite and often wants to eat every 15 minutes.  She has no fever.  She was seen by her primary care provider and instructed to come to the ED for further  evaluation. Psych was consulted for evaluation of anxiety/insomnia/hallucinations.   10/25/23:  Review of Systems  Constitutional: Negative.   HENT: Negative.    Eyes: Negative.   Respiratory: Negative.    Cardiovascular: Negative.   Gastrointestinal: Negative.   Genitourinary: Negative.   Musculoskeletal: Negative.   Skin: Negative.   Neurological: Negative.   Endo/Heme/Allergies: Negative.   Psychiatric/Behavioral:  Positive for hallucinations and memory loss. The patient is nervous/anxious and has insomnia.      Psychiatric and Social History  Psychiatric History:  Information collected from patient, her spouse and chart review  Prev Dx/Sx: anxiety Current Psych Provider: denies Home Meds (current): as listed below Previous Med Trials: deferred Therapy: denies  Prior Psych Hospitalization: denies  Prior Self Harm: denies Prior Violence: denies  Family Psych History: deferred Family Hx suicide: deferred  Social History:  Developmental Hx: deferred Educational Hx: deferred Occupational Hx: deferred Legal Hx: deferred Living Situation: lives at home with her spouse Spiritual Hx: references God; Access to weapons/lethal means: denies   Substance History Alcohol: denies  Illicit drugs: denies Prescription drug abuse: denies Rehab hx: n/a  Exam Findings  Physical Exam:  Vital Signs:  Temp:  [98 F (36.7  C)-98.6 F (37 C)] 98.6 F (37 C) (05/20 1003) Pulse Rate:  [83-106] 106 (05/20 1003) Resp:  [17-18] 18 (05/20 1003) BP: (98-121)/(68-73) 116/73 (05/20 1003) SpO2:  [96 %-100 %] 100 % (05/20 1003) Blood pressure 116/73, pulse (!) 106, temperature 98.6 F (37 C), resp. rate 18, height 5' (1.524 m), weight 56.2 kg, SpO2 100%. Body mass index is 24.22 kg/m.  Physical Exam Cardiovascular:     Rate and Rhythm: Normal rate.  Musculoskeletal:        General: Normal range of motion.     Cervical back: Normal range of motion.  Neurological:     Mental  Status: She is alert. Mental status is at baseline. She is disoriented.  Psychiatric:        Attention and Perception: She perceives auditory (reports seeing shadows, episodically) hallucinations.        Mood and Affect: Mood is anxious. Affect is labile and tearful.        Speech: Speech normal.        Behavior: Behavior is hyperactive.        Thought Content: Thought content does not include homicidal or suicidal ideation. Thought content does not include homicidal or suicidal plan.        Cognition and Memory: Cognition is impaired. Memory is impaired.        Judgment: Judgment is impulsive.     Mental Status Exam: General Appearance: Fairly Groomed  Orientation:  Full (Time, Place, and Person)  Memory:  Immediate;   Fair Recent;   Fair Remote;   Fair  Concentration:  Concentration: Poor and Attention Span: Poor  Recall:  Poor  Attention  Fair  Eye Contact:  Fair  Speech:  Clear and Coherent  Language:  Good  Volume:  Decreased  Mood: good  Affect:  stable  Thought Process:  coherent  Thought Content:  normal  Suicidal Thoughts:  No  Homicidal Thoughts:  No  Judgement:  Impaired  Insight:  Lacking  Psychomotor Activity:  normal  Akathisia:  No  Fund of Knowledge:  Fair      Assets:  Solicitor Social Support  Cognition:  Impaired,  Mild  ADL's:  Intact  AIMS (if indicated):        Other History   These have been pulled in through the EMR, reviewed, and updated if appropriate.  Family History:  The patient's family history includes Breast cancer (age of onset: 60) in her maternal aunt.  Medical History: Past Medical History:  Diagnosis Date   (HFpEF) heart failure with preserved ejection fraction (HCC)    Anemia due to chronic kidney disease    Chronic gastritis    CKD (chronic kidney disease), stage III (HCC)    Cognitive decline    DM2 (diabetes mellitus, type 2) (HCC)    Gastritis, Helicobacter pylori  2015   GERD (gastroesophageal reflux disease)    Wernicke-Korsakoff syndrome (HCC)     Surgical History: Past Surgical History:  Procedure Laterality Date   CESAREAN SECTION     CHOLECYSTECTOMY     DILATION AND CURETTAGE OF UTERUS     ESOPHAGOGASTRODUODENOSCOPY N/A 07/18/2019   Procedure: ESOPHAGOGASTRODUODENOSCOPY (EGD);  Surgeon: Toledo, Alphonsus Jeans, MD;  Location: [facility] ENDOSCOPY;  Service: Gastroenterology;  Laterality: N/A;     Medications:   Current Facility-Administered Medications:    acetaminophen  (TYLENOL ) tablet 500 mg, 500 mg, Oral, Q6H PRN, Zhang, Ping T, MD, 500 mg at 10/23/23 1359   dicyclomine  (BENTYL ) capsule  10 mg, 10 mg, Oral, BID PRN, Zhang, Ping T, MD   feeding supplement (ENSURE ENLIVE / ENSURE PLUS) liquid 237 mL, 237 mL, Oral, TID BM, Antoniette Batty T, MD, 237 mL at 10/25/23 1406   heparin  injection 5,000 Units, 5,000 Units, Subcutaneous, Q8H, Antoniette Batty T, MD, 5,000 Units at 10/25/23 1406   insulin  aspart (novoLOG ) injection 0-9 Units, 0-9 Units, Subcutaneous, TID WC, [place], MD, 2 Units at 10/25/23 1020   LORazepam  (ATIVAN ) tablet 1 mg, 1 mg, Oral, Q6H PRN, Zhang, Dekui, MD, 1 mg at 10/25/23 1020   melatonin tablet 2.5 mg, 2.5 mg, Oral, QHS, Zhang, Ping T, MD, 2.5 mg at 10/24/23 2137   OLANZapine  (ZYPREXA ) injection 2.5 mg, 2.5 mg, Intramuscular, Q6H PRN, Antoniette Batty T, MD, 2.5 mg at 10/21/23 2132   ondansetron  (ZOFRAN ) tablet 4 mg, 4 mg, Oral, Q6H PRN **OR** ondansetron  (ZOFRAN ) injection 4 mg, 4 mg, Intravenous, Q6H PRN, Antoniette Batty T, MD   pantoprazole  (PROTONIX ) EC tablet 40 mg, 40 mg, Oral, Daily, Zhang, Ping T, MD, 40 mg at 10/25/23 1020   QUEtiapine  (SEROQUEL ) tablet 12.5 mg, 12.5 mg, Oral, 2 times per day, Ariabella Brien, MD, 12.5 mg at 10/25/23 1406   QUEtiapine  (SEROQUEL ) tablet 25 mg, 25 mg, Oral, QHS, Zhang, Dekui, MD, 25 mg at 10/24/23 2137  Allergies: Allergies  Allergen Reactions   Sulfa Antibiotics Hives   Codeine Hives    Amond Speranza, MD

## 2023-10-25 NOTE — TOC Transition Note (Signed)
 Transition of Care [facility]) - Discharge Note   Patient Details  Name: Deanna Joseph MRN: [id number] Date of Birth: 21-Sep-1965  Transition of Care [facility]) CM/SW Contact:  Elsie Halo, RN Phone Number: 10/25/2023, 3:39 PM   Clinical Narrative:    Patient is medically clear to DC to home. Patient with [place] Medicaid and could not be setup with [facility] PT/OT due to payor. Referral for Outpatient PT/OT sent to [facility].     Barriers to Discharge: Continued Medical Work up   Patient Goals and CMS Choice            Discharge Placement                       Discharge Plan and Services Additional resources added to the After Visit Summary for                                       Social Drivers of Health (SDOH) Interventions SDOH Screenings   Food Insecurity: No Food Insecurity (10/21/2023)  Housing: Low Risk  (10/21/2023)  Transportation Needs: No Transportation Needs (10/21/2023)  Utilities: Not At Risk (10/21/2023)  Financial Resource Strain: Low Risk  (02/26/2020)   Received from [facility]  Recent Concern: Financial Resource Strain - Medium Risk (01/13/2020)   Received from [facility]  Social Connections: Moderately Integrated (10/21/2023)  Tobacco Use: Low Risk  (10/21/2023)     Readmission Risk Interventions     No data to display

## 2023-10-25 NOTE — Progress Notes (Addendum)
..      [facility] REFERRAL        Occupational Therapy * Physical Therapy * Speech Therapy                           DATE 10/25/23  PATIENT NAME Deanna Joseph  PATIENT MRN [id number]       DIAGNOSIS/DIAGNOSIS CODE R41.82 Altered Mental Status  DATE OF DISCHARGE: 10/25/23       PRIMARY CARE PHYSICIAN      PCP PHONE/FAX [place] [phone]      Dear Provider (Name: [facility] outpatient __  Fax: [phone]   I certify that I have examined this patient and that occupational/physical/speech therapy is necessary on an outpatient basis.    The patient has expressed interest in completing their recommended course of therapy at your  location.  Once a formal order from the patient's primary care physician has been obtained, please  contact him/her to schedule an appointment for evaluation at your earliest convenience.   [X]   Physical Therapy Evaluate and Treat  [X]   Occupational Therapy Evaluate and Treat  [ ]   Speech Therapy Evaluate and Treat         The patient's primary care physician (listed above) must furnish and be responsible for a formal order such that the recommended services may be furnished while under the primary physician's care, and that the plan of care will be established and reviewed every 30 days (or more often if condition necessitates).

## 2023-10-25 NOTE — Discharge Summary (Signed)
 Physician Discharge Summary   [name] MRN: [id number] DOB: 1965-11-10  Admit date:     10/20/2023  Discharge date: 10/25/23  Discharge Physician: Deanna Joseph   PCP: Deanna Joseph   Recommendations at discharge:   Follow-up with PCP in 1 week. Please refer to psychiatry for severe anxiety.  Discharge Diagnoses: Principal Problem:   AMS (altered mental status) Active Problems:   GERD without esophagitis   Diet-controlled diabetes mellitus (HCC)   Acute renal failure superimposed on stage 3b chronic kidney disease (HCC)   Metabolic acidosis   Hypotension   Depression   Cognitive deficits   Insomnia   Type 2 diabetes mellitus with hypoglycemia (HCC)  Resolved Problems:   * No resolved hospital problems. *  Hospital Course: Deanna Joseph is a 58 y.o. female with medical history significant of CKD stage IIIb, HTN, chronic HFpEF, IIDM, anxiety/depression, brought in by family member for evaluation of altered mentations.  Patient has a severe insomnia, she also has history of cognitive abnormality.  She was also found to have significant hypoglycemia. Patient condition was mainly from severe insomnia, she has not been sleeping for days.  Once came to the hospital, she was started on melatonin and Seroquel .  We discontinued trazodone .  She slept well last night, she is less confused.     Assessment and Plan: Altered mental status with increased confusion, delirium. Cognitive deficits. Chronic insomnia. Patient has a chronic insomnia, came to hospital with increased confusion. No infection was identified. Will try to improve his sleep pattern by adding Seroquel  in addition to melatonin. Patient also has been seen by psychiatry, recommended Seroquel  12.5 mg twice a day in the morning and lunchtime, 25 mg in the evening.  Continue as needed Ativan  at 0.5 mg. I also requested TOC consult to see if the patient can be placed somewhere, but only option for patient  is home care.  At this point, patient will be discharged home with home care.   Type 2 diabetes with hypoglycemia. Transient hypotension. A1c 4.9.  It appears that the patient was not taking any diabetic medicines.  Due to significant hypoglycemia, and transient hypotension, will check a.m. cortisol level.  TSH normal. Cortisol level was ordered yesterday, results still pending.  Please follow-up on the results. Blood pressure has been better, but will hold off blood pressure medicine at this time. Cortisol level only 4.2.  But after the ACTH injection, cortisol level increased to 40.  Patient does not have adrenal insufficiency. Fortunately, patient no longer has any hypotension or hypoglycemia.   Acute kidney injury on chronic kidney disease stage IIIb. Metabolic acidosis resolved. Renal function has back to baseline.        Consultants: Psychiatry. Procedures performed: None  Disposition: Home health Diet recommendation:  Discharge Diet Orders (From admission, onward)     Start     Ordered   10/23/23 0000  Diet - low sodium heart healthy        10/23/23 0932           Cardiac diet DISCHARGE MEDICATION: Allergies as of 10/25/2023       Reactions   Sulfa Antibiotics Hives   Codeine Hives        Medication List     STOP taking these medications    furosemide  40 MG tablet Commonly known as: LASIX    metoprolol  succinate 25 MG 24 hr tablet Commonly known as: TOPROL -XL   nitrofurantoin  (macrocrystal-monohydrate) 100 MG capsule Commonly known as:  MACROBID    spironolactone  25 MG tablet Commonly known as: ALDACTONE    traMADol  50 MG tablet Commonly known as: ULTRAM    traZODone  100 MG tablet Commonly known as: DESYREL    traZODone  50 MG tablet Commonly known as: DESYREL        TAKE these medications    acetaminophen  500 MG tablet Commonly known as: TYLENOL  Take 500 mg by mouth every 6 (six) hours as needed.   dicyclomine  10 MG capsule Commonly  known as: BENTYL  Take 1 capsule (10 mg total) by mouth 2 (two) times daily as needed for spasms.   dicyclomine  20 MG tablet Commonly known as: BENTYL  Take 20 mg by mouth 4 (four) times daily as needed (stomach pains). For 7 days   feeding supplement Liqd Take 237 mLs by mouth 3 (three) times daily between meals.   LORazepam  0.5 MG tablet Commonly known as: ATIVAN  Take 0.5 mg by mouth at bedtime.   melatonin 5 MG Tabs Take 0.5 tablets (2.5 mg total) by mouth at bedtime.   omeprazole 40 MG capsule Commonly known as: PRILOSEC Take 40 mg by mouth daily as needed.   QUEtiapine  25 MG tablet Commonly known as: SEROQUEL  Take 1 tablet (25 mg total) by mouth at bedtime.   QUEtiapine  25 MG tablet Commonly known as: SEROquel  Take 0.5 tablets (12.5 mg total) by mouth 2 (two) times daily.        Follow-up Information     Deanna Collin, Joseph Follow up in 1 week(s).   Specialty: Pain Medicine Why: Worsening of CKD and proteinuria. CT in APril ok. Contact information: [address] [place] [address] [phone]         [facility] Follow up.   Why: Chronic insomnia, polypharmacy Contact [address] [place] [address] [phone]         Deanna Joseph Follow up in 1 week(s).   Specialty: Family Medicine Contact information: [address] [place] [address] [phone]                Discharge Exam: Deanna Joseph Weights   10/20/23 1622  Weight: 56.2 kg   General exam: Appears calm and comfortable  Respiratory system: Clear to auscultation. Respiratory effort normal. Cardiovascular system: S1 & S2 heard, RRR. No JVD, murmurs, rubs, gallops or clicks. No pedal edema. Gastrointestinal system: Abdomen is nondistended, soft and nontender. No organomegaly or masses felt. Normal bowel sounds heard. Central nervous system: Alert and oriented x2. No focal neurological deficits. Extremities: Symmetric 5  x 5 power. Skin: No rashes, lesions or ulcers Psychiatry: Very anxious.   Condition at discharge: fair  The results of significant diagnostics from this hospitalization (including imaging, microbiology, ancillary and laboratory) are listed below for reference.   Imaging Studies: MR Brain W and Wo Contrast Result Date: 10/20/2023 CLINICAL DATA:  Altered mental status EXAM: MRI HEAD WITHOUT AND WITH CONTRAST TECHNIQUE: Multiplanar, multiecho pulse sequences of the brain and surrounding structures were obtained without and with intravenous contrast. CONTRAST:  5mL GADAVIST  GADOBUTROL  1 MMOL/ML IV SOLN COMPARISON:  09/30/2022 FINDINGS: Brain: No acute infarct, mass effect or extra-axial collection. No acute or chronic hemorrhage. There is multifocal hyperintense T2-weighted signal within the white matter. Age-advanced volume loss. 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 Dilated temporal horns of the lateral ventricles with anterior temporal predominant volume loss. The midline structures are normal. Vascular: Normal flow voids. Skull and upper cervical spine: Normal calvarium and skull base. Visualized  upper cervical spine and soft tissues are normal. Sinuses/Orbits:No paranasal sinus fluid levels or advanced mucosal thickening. No mastoid or middle ear effusion. Normal orbits. IMPRESSION: 1. No acute intracranial abnormality. 2. Dilated temporal horns of the lateral ventricles with anterior temporal predominant volume loss. 3. 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 Electronically Signed   By: Juanetta Nordmann M.D.   On: 10/20/2023 22:55   CT Head Wo Contrast Result Date: 10/20/2023 CLINICAL DATA:  Mental status change, unknown cause EXAM: CT HEAD WITHOUT CONTRAST TECHNIQUE: Contiguous axial images were obtained from the base of the skull through the vertex without intravenous contrast. RADIATION DOSE REDUCTION: This exam was performed according to the departmental  dose-optimization program which includes automated exposure control, adjustment of the mA and/or kV according to patient size and/or use of iterative reconstruction technique. COMPARISON:  01/11/2023 FINDINGS: Brain: Old left thalamic lacunar infarct. No acute intracranial abnormality. Specifically, no hemorrhage, hydrocephalus, mass lesion, acute infarction, or significant intracranial injury. Vascular: No hyperdense vessel or unexpected calcification. Skull: No acute calvarial abnormality. Sinuses/Orbits: No acute findings Other: None IMPRESSION: No acute intracranial abnormality. Electronically Signed   By: Janeece Mechanic M.D.   On: 10/20/2023 19:09   DG Chest 2 View Result Date: 10/20/2023 CLINICAL DATA:  Chest pain. EXAM: CHEST - 2 VIEW COMPARISON:  09/29/2023. FINDINGS: Low lung volumes. The heart size and mediastinal contours are within normal limits. Both lungs are clear. No pleural effusion or pneumothorax. No acute osseous abnormality. IMPRESSION: No active cardiopulmonary disease. Electronically Signed   By: Mannie Seek M.D.   On: 10/20/2023 17:18   CT ABDOMEN PELVIS WO CONTRAST Result Date: 09/29/2023 CLINICAL DATA:  Right-sided abdominal pain. EXAM: CT ABDOMEN AND PELVIS WITHOUT CONTRAST TECHNIQUE: Multidetector CT imaging of the abdomen and pelvis was performed following the standard protocol without IV contrast. RADIATION DOSE REDUCTION: This exam was performed according to the departmental dose-optimization program which includes automated exposure control, adjustment of the mA and/or kV according to patient size and/or use of iterative reconstruction technique. COMPARISON:  May 15, 2023 FINDINGS: Lower chest: No acute abnormality. Hepatobiliary: No focal liver abnormality is seen. Status post cholecystectomy. No biliary dilatation. Pancreas: Unremarkable. No pancreatic ductal dilatation or surrounding inflammatory changes. Spleen: Normal in size without focal abnormality.  Adrenals/Urinary Tract: Adrenal glands are unremarkable. Kidneys are normal in size, without renal calculi or hydronephrosis. A small stable right renal cyst is noted. No follow-up imaging is recommended. Bladder is unremarkable. Stomach/Bowel: Stomach is poorly distended and is otherwise within normal limits. Appendix appears normal. No evidence of bowel wall thickening, distention, or inflammatory changes. Vascular/Lymphatic: Aortic atherosclerosis. No enlarged abdominal or pelvic lymph nodes. Reproductive: The uterus is not clearly identified and may be surgically absent. The bilateral adnexa are unremarkable. Other: No abdominal wall hernia or abnormality. No abdominopelvic ascites. Musculoskeletal: No acute or significant osseous findings. IMPRESSION: 1. No evidence of acute or active process within the abdomen or pelvis. 2. Evidence of prior cholecystectomy. 3. Aortic atherosclerosis. Electronically Signed   By: Virgle Grime M.D.   On: 09/29/2023 23:12   DG Chest 2 View Result Date: 09/29/2023 CLINICAL DATA:  Chest pain EXAM: CHEST - 2 VIEW COMPARISON:  Chest x-ray 05/15/2023 FINDINGS: The heart size and mediastinal contours are within normal limits. Both lungs are clear. The visualized skeletal structures are unremarkable. IMPRESSION: No active cardiopulmonary disease. Electronically Signed   By: Tyron Gallon M.D.   On: 09/29/2023 20:31    Microbiology: Results for orders placed  or performed during the hospital encounter of 01/16/23  SARS Coronavirus 2 by RT PCR (hospital order, performed in [facility] hospital lab) *cepheid single result test* Anterior Nasal Swab     Status: None   Collection Time: 01/16/23  8:27 PM   Specimen: Anterior Nasal Swab  Result Value Ref Range Status   SARS Coronavirus 2 by RT PCR NEGATIVE NEGATIVE Final    Comment: (NOTE) SARS-CoV-2 target nucleic acids are NOT DETECTED.  The SARS-CoV-2 RNA is generally detectable in upper and lower respiratory specimens  during the acute phase of infection. The lowest concentration of SARS-CoV-2 viral copies this assay can detect is 250 copies / mL. A negative result does not preclude SARS-CoV-2 infection and should not be used as the sole basis for treatment or other patient management decisions.  A negative result may occur with improper specimen collection / handling, submission of specimen other than nasopharyngeal swab, presence of viral mutation(s) within the areas targeted by this assay, and inadequate number of viral copies (<250 copies / mL). A negative result must be combined with clinical observations, patient history, and epidemiological information.  Fact Sheet for Patients:   RoadLapTop.co.za  Fact Sheet for Healthcare Providers: http://kim-miller.com/  This test is not yet approved or  cleared by the [place]  FDA and has been authorized for detection and/or diagnosis of SARS-CoV-2 by FDA under an Emergency Use Authorization (EUA).  This EUA will remain in effect (meaning this test can be used) for the duration of the COVID-19 declaration under Section 564(b)(1) of the Act, 21 U.S.C. section 360bbb-3(b)(1), unless the authorization is terminated or revoked sooner.  Performed at [facility], [address] [place], [place] [address]     Labs: CBC: Recent Labs  Lab 10/20/23 1624  WBC 6.1  HGB 11.8*  HCT 35.2*  MCV 89.3  PLT 261   Basic Metabolic Panel: Recent Labs  Lab 10/20/23 1624 10/22/23 0614 10/23/23 0343  NA 135 137 137  K 4.1 4.2 3.8  CL 100 103 103  CO2 16* 23 24  GLUCOSE 70 95 134*  BUN 32* 23* 26*  CREATININE 2.01* 1.41* 1.53*  CALCIUM  9.1 9.1 8.5*  MG  --   --  1.8   Liver Function Tests: Recent Labs  Lab 10/20/23 1624  AST 30  ALT 20  ALKPHOS 59  BILITOT 1.3*  PROT 7.6  ALBUMIN 4.3   CBG: Recent Labs  Lab 10/24/23 1142 10/24/23 1725 10/24/23 2100 10/25/23 0957 10/25/23 1213   GLUCAP 127* 89 147* 153* 78    Discharge time spent: greater than 30 minutes.  Signed: Donaciano Frizzle, [facility] 10/25/2023

## 2023-10-25 NOTE — Plan of Care (Signed)
  Problem: Education: Goal: Individualized Educational Video(s) Outcome: Progressing   Problem: Fluid Volume: Goal: Ability to maintain a balanced intake and output will improve Outcome: Progressing   Problem: Health Behavior/Discharge Planning: Goal: Ability to manage health-related needs will improve Outcome: Progressing   Problem: Nutritional: Goal: Maintenance of adequate nutrition will improve Outcome: Progressing

## 2023-10-27 DIAGNOSIS — Z1389 Encounter for screening for other disorder: Secondary | ICD-10-CM | POA: Diagnosis not present

## 2023-10-27 DIAGNOSIS — G4700 Insomnia, unspecified: Secondary | ICD-10-CM | POA: Diagnosis not present

## 2023-10-27 DIAGNOSIS — E538 Deficiency of other specified B group vitamins: Secondary | ICD-10-CM | POA: Diagnosis not present

## 2023-10-27 DIAGNOSIS — F0390 Unspecified dementia without behavioral disturbance: Secondary | ICD-10-CM | POA: Diagnosis not present

## 2023-10-27 DIAGNOSIS — F418 Other specified anxiety disorders: Secondary | ICD-10-CM | POA: Diagnosis not present

## 2023-10-27 DIAGNOSIS — E119 Type 2 diabetes mellitus without complications: Secondary | ICD-10-CM | POA: Diagnosis not present

## 2023-10-27 DIAGNOSIS — E278 Other specified disorders of adrenal gland: Secondary | ICD-10-CM | POA: Diagnosis not present

## 2023-10-27 DIAGNOSIS — N183 Chronic kidney disease, stage 3 unspecified: Secondary | ICD-10-CM | POA: Diagnosis not present

## 2023-10-27 DIAGNOSIS — Z712 Person consulting for explanation of examination or test findings: Secondary | ICD-10-CM | POA: Diagnosis not present

## 2023-11-09 ENCOUNTER — Other Ambulatory Visit: Payer: Self-pay | Admitting: Nephrology

## 2023-11-09 DIAGNOSIS — E1122 Type 2 diabetes mellitus with diabetic chronic kidney disease: Secondary | ICD-10-CM

## 2023-11-09 DIAGNOSIS — N1832 Chronic kidney disease, stage 3b: Secondary | ICD-10-CM | POA: Diagnosis not present

## 2023-11-23 DIAGNOSIS — I509 Heart failure, unspecified: Secondary | ICD-10-CM | POA: Diagnosis not present

## 2023-11-23 DIAGNOSIS — R269 Unspecified abnormalities of gait and mobility: Secondary | ICD-10-CM | POA: Diagnosis not present

## 2023-12-08 DIAGNOSIS — R32 Unspecified urinary incontinence: Secondary | ICD-10-CM | POA: Diagnosis not present

## 2023-12-08 DIAGNOSIS — Z1389 Encounter for screening for other disorder: Secondary | ICD-10-CM | POA: Diagnosis not present

## 2023-12-08 DIAGNOSIS — N390 Urinary tract infection, site not specified: Secondary | ICD-10-CM | POA: Diagnosis not present

## 2023-12-08 DIAGNOSIS — R269 Unspecified abnormalities of gait and mobility: Secondary | ICD-10-CM | POA: Diagnosis not present

## 2023-12-08 DIAGNOSIS — G4700 Insomnia, unspecified: Secondary | ICD-10-CM | POA: Diagnosis not present

## 2023-12-12 ENCOUNTER — Telehealth: Payer: Self-pay | Admitting: *Deleted

## 2023-12-12 NOTE — Progress Notes (Unsigned)
 Complex Care Management Note Care Guide Note  12/12/2023 Name: Deanna Joseph MRN: [id number] DOB: [birth year]   Complex Care Management Outreach Attempts: An unsuccessful telephone outreach was attempted today to offer the patient information about available complex care management services.  Follow Up Plan:  Additional outreach attempts will be made to offer the patient complex care management information and services.   Encounter Outcome:  No Answer  Thedford Franks, CMA [place]  [facility], [facility] Guide Direct Dial: [phone]  Fax: [phone] Website: [email or web address]

## 2023-12-13 NOTE — Progress Notes (Unsigned)
 Complex Care Management Note Care Guide Note  12/13/2023 Name: Deanna Joseph MRN: [id number] DOB: 11/25/1965   Complex Care Management Outreach Attempts: A second unsuccessful outreach was attempted today to offer the patient with information about available complex care management services.  Follow Up Plan:  Additional outreach attempts will be made to offer the patient complex care management information and services.   Encounter Outcome:  No Answer  Thedford Franks, CMA [place]  [facility], [facility] Guide Direct Dial: [phone]  Fax: [phone] Website: [email or web address]

## 2023-12-14 NOTE — Progress Notes (Signed)
 Complex Care Management Note Care Guide Note  12/14/2023 Name: Deanna Joseph MRN: [id number] DOB: Aug 02, 1965   Complex Care Management Outreach Attempts: A third unsuccessful outreach was attempted today to offer the patient with information about available complex care management services.  Follow Up Plan:  No further outreach attempts will be made at this time. We have been unable to contact the patient to offer or enroll patient in complex care management services.  Encounter Outcome:  No Answer  Thedford Franks, CMA [place]  [facility], [facility] Guide Direct Dial: [phone]  Fax: [phone] Website: [email or web address]

## 2023-12-23 DIAGNOSIS — N183 Chronic kidney disease, stage 3 unspecified: Secondary | ICD-10-CM | POA: Diagnosis not present

## 2023-12-23 DIAGNOSIS — F0390 Unspecified dementia without behavioral disturbance: Secondary | ICD-10-CM | POA: Diagnosis not present

## 2023-12-23 DIAGNOSIS — R269 Unspecified abnormalities of gait and mobility: Secondary | ICD-10-CM | POA: Diagnosis not present

## 2024-03-29 ENCOUNTER — Emergency Department

## 2024-03-29 DIAGNOSIS — R040 Epistaxis: Secondary | ICD-10-CM | POA: Diagnosis not present

## 2024-03-29 DIAGNOSIS — Z5321 Procedure and treatment not carried out due to patient leaving prior to being seen by health care provider: Secondary | ICD-10-CM | POA: Insufficient documentation

## 2024-03-29 DIAGNOSIS — R002 Palpitations: Secondary | ICD-10-CM | POA: Diagnosis not present

## 2024-03-29 DIAGNOSIS — R109 Unspecified abdominal pain: Secondary | ICD-10-CM | POA: Insufficient documentation

## 2024-03-29 DIAGNOSIS — R079 Chest pain, unspecified: Secondary | ICD-10-CM | POA: Insufficient documentation

## 2024-03-29 LAB — CBC
HCT: 32.7 % — ABNORMAL LOW (ref 36.0–46.0)
Hemoglobin: 10.7 g/dL — ABNORMAL LOW (ref 12.0–15.0)
MCH: 30.6 pg (ref 26.0–34.0)
MCHC: 32.7 g/dL (ref 30.0–36.0)
MCV: 93.4 fL (ref 80.0–100.0)
Platelets: 253 K/uL (ref 150–400)
RBC: 3.5 MIL/uL — ABNORMAL LOW (ref 3.87–5.11)
RDW: 13.1 % (ref 11.5–15.5)
WBC: 5.8 K/uL (ref 4.0–10.5)
nRBC: 0 % (ref 0.0–0.2)

## 2024-03-29 NOTE — ED Triage Notes (Signed)
 Pt arrived to ED d/t a nosebleed that began about 50 minutes ago. Per pt, pt stated that her chest began hurting and stated she had palpitations as soon as the nosebleed began. Upon triage, pt's bleeding is controlled but is still hurting in chest and abd.

## 2024-03-30 ENCOUNTER — Emergency Department
Admission: EM | Admit: 2024-03-30 | Discharge: 2024-03-30 | Discharge status: Against Medical Advice | Attending: Emergency Medicine | Admitting: Emergency Medicine

## 2024-03-30 LAB — HEPATIC FUNCTION PANEL
ALT: 12 U/L (ref 0–44)
AST: 27 U/L (ref 15–41)
Albumin: 3.7 g/dL (ref 3.5–5.0)
Alkaline Phosphatase: 92 U/L (ref 38–126)
Bilirubin, Direct: 0.3 mg/dL — ABNORMAL HIGH (ref 0.0–0.2)
Indirect Bilirubin: 0.7 mg/dL (ref 0.3–0.9)
Total Bilirubin: 1 mg/dL (ref 0.0–1.2)
Total Protein: 8 g/dL (ref 6.5–8.1)

## 2024-03-30 LAB — BASIC METABOLIC PANEL WITH GFR
Anion gap: 10 (ref 5–15)
BUN: 31 mg/dL — ABNORMAL HIGH (ref 6–20)
CO2: 18 mmol/L — ABNORMAL LOW (ref 22–32)
Calcium: 8.1 mg/dL — ABNORMAL LOW (ref 8.9–10.3)
Chloride: 110 mmol/L (ref 98–111)
Creatinine, Ser: 1.52 mg/dL — ABNORMAL HIGH (ref 0.44–1.00)
GFR, Estimated: 40 mL/min — ABNORMAL LOW (ref >60)
Glucose, Bld: 101 mg/dL — ABNORMAL HIGH (ref 70–99)
Potassium: 4.3 mmol/L (ref 3.5–5.1)
Sodium: 138 mmol/L (ref 135–145)

## 2024-03-30 LAB — LIPASE, BLOOD: Lipase: 47 U/L (ref 11–51)

## 2024-03-30 LAB — TROPONIN I (HIGH SENSITIVITY): Troponin I (High Sensitivity): <2 ng/L (ref <18)

## 2024-04-12 DIAGNOSIS — Z20822 Contact with and (suspected) exposure to covid-19: Secondary | ICD-10-CM | POA: Diagnosis not present

## 2024-04-12 DIAGNOSIS — J069 Acute upper respiratory infection, unspecified: Secondary | ICD-10-CM | POA: Diagnosis not present

## 2024-04-12 DIAGNOSIS — Z1389 Encounter for screening for other disorder: Secondary | ICD-10-CM | POA: Diagnosis not present

## 2024-04-12 DIAGNOSIS — Z1159 Encounter for screening for other viral diseases: Secondary | ICD-10-CM | POA: Diagnosis not present

## 2024-05-11 DIAGNOSIS — I10 Essential (primary) hypertension: Secondary | ICD-10-CM | POA: Diagnosis not present

## 2024-06-26 ENCOUNTER — Emergency Department
Admission: EM | Admit: 2024-06-26 | Discharge: 2024-06-26 | Discharge status: Against Medical Advice | Attending: Emergency Medicine | Admitting: Emergency Medicine

## 2024-06-26 ENCOUNTER — Encounter: Payer: Self-pay | Admitting: *Deleted

## 2024-06-26 ENCOUNTER — Other Ambulatory Visit: Payer: Self-pay

## 2024-06-26 DIAGNOSIS — R1110 Vomiting, unspecified: Secondary | ICD-10-CM | POA: Diagnosis not present

## 2024-06-26 DIAGNOSIS — Z5321 Procedure and treatment not carried out due to patient leaving prior to being seen by health care provider: Secondary | ICD-10-CM | POA: Diagnosis not present

## 2024-06-26 DIAGNOSIS — R109 Unspecified abdominal pain: Secondary | ICD-10-CM | POA: Insufficient documentation

## 2024-06-26 LAB — COMPREHENSIVE METABOLIC PANEL WITH GFR
ALT: 10 U/L (ref 0–44)
AST: 22 U/L (ref 15–41)
Albumin: 4.4 g/dL (ref 3.5–5.0)
Alkaline Phosphatase: 120 U/L (ref 38–126)
Anion gap: 16 — ABNORMAL HIGH (ref 5–15)
BUN: 32 mg/dL — ABNORMAL HIGH (ref 6–20)
CO2: 19 mmol/L — ABNORMAL LOW (ref 22–32)
Calcium: 8.8 mg/dL — ABNORMAL LOW (ref 8.9–10.3)
Chloride: 103 mmol/L (ref 98–111)
Creatinine, Ser: 1.88 mg/dL — ABNORMAL HIGH (ref 0.44–1.00)
GFR, Estimated: 30 mL/min — ABNORMAL LOW
Glucose, Bld: 129 mg/dL — ABNORMAL HIGH (ref 70–99)
Potassium: 4.3 mmol/L (ref 3.5–5.1)
Sodium: 139 mmol/L (ref 135–145)
Total Bilirubin: 0.3 mg/dL (ref 0.0–1.2)
Total Protein: 7.6 g/dL (ref 6.5–8.1)

## 2024-06-26 LAB — CBC
HCT: 36.1 % (ref 36.0–46.0)
Hemoglobin: 12 g/dL (ref 12.0–15.0)
MCH: 30.1 pg (ref 26.0–34.0)
MCHC: 33.2 g/dL (ref 30.0–36.0)
MCV: 90.5 fL (ref 80.0–100.0)
Platelets: 276 K/uL (ref 150–400)
RBC: 3.99 MIL/uL (ref 3.87–5.11)
RDW: 12.3 % (ref 11.5–15.5)
WBC: 5.7 K/uL (ref 4.0–10.5)
nRBC: 0 % (ref 0.0–0.2)

## 2024-06-26 LAB — LIPASE, BLOOD: Lipase: 57 U/L — ABNORMAL HIGH (ref 11–51)

## 2024-06-26 NOTE — ED Triage Notes (Signed)
 Pt to triage via wheelchair.  Pt has abd pain with vomiting.  No diarrhea.  Sx began tonight.   Pt alert   speech clear.
